# Patient Record
Sex: Female | Born: 1935 | Race: White | Hispanic: No | Smoking: Former smoker
Health system: Southern US, Community
[De-identification: ages and names within clinical notes are randomized; demographics above are authoritative.]

## PROBLEM LIST (undated history)

## (undated) DIAGNOSIS — I639 Cerebral infarction, unspecified: Secondary | ICD-10-CM

## (undated) DIAGNOSIS — I10 Essential (primary) hypertension: Secondary | ICD-10-CM

## (undated) DIAGNOSIS — N390 Urinary tract infection, site not specified: Secondary | ICD-10-CM

## (undated) DIAGNOSIS — F32A Depression, unspecified: Secondary | ICD-10-CM

## (undated) DIAGNOSIS — K219 Gastro-esophageal reflux disease without esophagitis: Secondary | ICD-10-CM

## (undated) DIAGNOSIS — F329 Major depressive disorder, single episode, unspecified: Secondary | ICD-10-CM

## (undated) DIAGNOSIS — E119 Type 2 diabetes mellitus without complications: Secondary | ICD-10-CM

## (undated) HISTORY — PX: TONSILLECTOMY: SUR1361

---

## 2014-04-04 ENCOUNTER — Emergency Department: Payer: Self-pay | Admitting: Emergency Medicine

## 2014-04-04 LAB — CBC WITH DIFFERENTIAL/PLATELET
BASOS ABS: 0 10*3/uL (ref 0.0–0.1)
Basophil %: 0.7 %
EOS PCT: 3.1 %
Eosinophil #: 0.1 10*3/uL (ref 0.0–0.7)
HCT: 32.5 % — ABNORMAL LOW (ref 35.0–47.0)
HGB: 10.8 g/dL — AB (ref 12.0–16.0)
LYMPHS ABS: 0.6 10*3/uL — AB (ref 1.0–3.6)
LYMPHS PCT: 13.7 %
MCH: 32 pg (ref 26.0–34.0)
MCHC: 33.1 g/dL (ref 32.0–36.0)
MCV: 96 fL (ref 80–100)
Monocyte #: 0.3 x10 3/mm (ref 0.2–0.9)
Monocyte %: 6.3 %
Neutrophil #: 3.3 10*3/uL (ref 1.4–6.5)
Neutrophil %: 76.2 %
PLATELETS: 146 10*3/uL — AB (ref 150–440)
RBC: 3.37 10*6/uL — AB (ref 3.80–5.20)
RDW: 14.7 % — ABNORMAL HIGH (ref 11.5–14.5)
WBC: 4.4 10*3/uL (ref 3.6–11.0)

## 2014-04-04 LAB — COMPREHENSIVE METABOLIC PANEL
ALT: 16 U/L
ANION GAP: 3 — AB (ref 7–16)
AST: 22 U/L (ref 15–37)
Albumin: 3 g/dL — ABNORMAL LOW (ref 3.4–5.0)
Alkaline Phosphatase: 56 U/L
BUN: 19 mg/dL — ABNORMAL HIGH (ref 7–18)
Bilirubin,Total: 0.3 mg/dL (ref 0.2–1.0)
CREATININE: 1.2 mg/dL (ref 0.60–1.30)
Calcium, Total: 8 mg/dL — ABNORMAL LOW (ref 8.5–10.1)
Chloride: 103 mmol/L (ref 98–107)
Co2: 30 mmol/L (ref 21–32)
EGFR (African American): 55 — ABNORMAL LOW
EGFR (Non-African Amer.): 45 — ABNORMAL LOW
GLUCOSE: 384 mg/dL — AB (ref 65–99)
Osmolality: 290 (ref 275–301)
POTASSIUM: 4.5 mmol/L (ref 3.5–5.1)
SODIUM: 136 mmol/L (ref 136–145)
Total Protein: 6 g/dL — ABNORMAL LOW (ref 6.4–8.2)

## 2014-04-04 LAB — TROPONIN I: Troponin-I: 0.04 ng/mL

## 2014-04-05 LAB — URINALYSIS, COMPLETE
Bilirubin,UR: NEGATIVE
Blood: NEGATIVE
Glucose,UR: >=500 mg/dL (ref 0–75)
KETONE: NEGATIVE
Leukocyte Esterase: NEGATIVE
Nitrite: NEGATIVE
PH: 6 (ref 4.5–8.0)
PROTEIN: NEGATIVE
RBC,UR: 1 /HPF (ref 0–5)
SQUAMOUS EPITHELIAL: NONE SEEN
Specific Gravity: 1.015 (ref 1.003–1.030)
WBC UR: 3 /HPF (ref 0–5)

## 2014-04-05 LAB — TROPONIN I: TROPONIN-I: 0.03 ng/mL

## 2014-04-05 LAB — GLUCOSE, RANDOM: GLUCOSE: 350 mg/dL — AB (ref 65–99)

## 2014-07-15 ENCOUNTER — Emergency Department: Payer: Self-pay | Admitting: Emergency Medicine

## 2014-07-15 LAB — CBC
HCT: 32.6 % — ABNORMAL LOW (ref 35.0–47.0)
HGB: 10.5 g/dL — AB (ref 12.0–16.0)
MCH: 30 pg (ref 26.0–34.0)
MCHC: 32.2 g/dL (ref 32.0–36.0)
MCV: 93 fL (ref 80–100)
PLATELETS: 165 10*3/uL (ref 150–440)
RBC: 3.51 10*6/uL — AB (ref 3.80–5.20)
RDW: 15.5 % — ABNORMAL HIGH (ref 11.5–14.5)
WBC: 4.9 10*3/uL (ref 3.6–11.0)

## 2014-07-15 LAB — URINALYSIS, COMPLETE
BILIRUBIN, UR: NEGATIVE
Bacteria: NONE SEEN
Blood: NEGATIVE
Glucose,UR: >=500 mg/dL (ref 0–75)
Ketone: NEGATIVE
LEUKOCYTE ESTERASE: NEGATIVE
Nitrite: NEGATIVE
PH: 6 (ref 4.5–8.0)
PROTEIN: NEGATIVE
RBC,UR: 1 /HPF (ref 0–5)
Specific Gravity: 1.011 (ref 1.003–1.030)
Squamous Epithelial: <1
WBC UR: NONE SEEN /HPF (ref 0–5)

## 2014-07-15 LAB — COMPREHENSIVE METABOLIC PANEL
ANION GAP: 7 (ref 7–16)
Albumin: 3.3 g/dL — ABNORMAL LOW (ref 3.4–5.0)
Alkaline Phosphatase: 57 U/L
BILIRUBIN TOTAL: 0.2 mg/dL (ref 0.2–1.0)
BUN: 31 mg/dL — AB (ref 7–18)
CREATININE: 1.36 mg/dL — AB (ref 0.60–1.30)
Calcium, Total: 7.9 mg/dL — ABNORMAL LOW (ref 8.5–10.1)
Chloride: 104 mmol/L (ref 98–107)
Co2: 26 mmol/L (ref 21–32)
EGFR (African American): 48 — ABNORMAL LOW
EGFR (Non-African Amer.): 39 — ABNORMAL LOW
GLUCOSE: 279 mg/dL — AB (ref 65–99)
Osmolality: 290 (ref 275–301)
POTASSIUM: 4.9 mmol/L (ref 3.5–5.1)
SGOT(AST): 19 U/L (ref 15–37)
SGPT (ALT): 18 U/L
Sodium: 137 mmol/L (ref 136–145)
TOTAL PROTEIN: 6.6 g/dL (ref 6.4–8.2)

## 2014-07-15 LAB — TROPONIN I: Troponin-I: 0.02 ng/mL

## 2014-08-16 ENCOUNTER — Emergency Department: Payer: Self-pay | Admitting: Emergency Medicine

## 2014-08-16 LAB — URINALYSIS, COMPLETE
BLOOD: NEGATIVE
Bilirubin,UR: NEGATIVE
Glucose,UR: NEGATIVE mg/dL (ref 0–75)
KETONE: NEGATIVE
Leukocyte Esterase: NEGATIVE
Nitrite: POSITIVE
Ph: 5 (ref 4.5–8.0)
RBC,UR: <1 /HPF (ref 0–5)
SPECIFIC GRAVITY: 1.018 (ref 1.003–1.030)
Squamous Epithelial: <1
WBC UR: 3 /HPF (ref 0–5)

## 2014-08-16 LAB — COMPREHENSIVE METABOLIC PANEL
Albumin: 3.6 g/dL (ref 3.4–5.0)
Alkaline Phosphatase: 60 U/L (ref 46–116)
Anion Gap: 7 (ref 7–16)
BILIRUBIN TOTAL: 0.4 mg/dL (ref 0.2–1.0)
BUN: 28 mg/dL — ABNORMAL HIGH (ref 7–18)
CHLORIDE: 104 mmol/L (ref 98–107)
CO2: 27 mmol/L (ref 21–32)
Calcium, Total: 9 mg/dL (ref 8.5–10.1)
Creatinine: 1.24 mg/dL (ref 0.60–1.30)
GFR CALC AF AMER: 53 — AB
GFR CALC NON AF AMER: 44 — AB
Glucose: 164 mg/dL — ABNORMAL HIGH (ref 65–99)
Osmolality: 285 (ref 275–301)
Potassium: 4.9 mmol/L (ref 3.5–5.1)
SGOT(AST): 17 U/L (ref 15–37)
SGPT (ALT): 17 U/L (ref 14–63)
SODIUM: 138 mmol/L (ref 136–145)
Total Protein: 6.8 g/dL (ref 6.4–8.2)

## 2014-08-16 LAB — CBC
HCT: 33.5 % — ABNORMAL LOW (ref 35.0–47.0)
HGB: 10.7 g/dL — AB (ref 12.0–16.0)
MCH: 29.2 pg (ref 26.0–34.0)
MCHC: 32 g/dL (ref 32.0–36.0)
MCV: 91 fL (ref 80–100)
PLATELETS: 165 10*3/uL (ref 150–440)
RBC: 3.67 10*6/uL — AB (ref 3.80–5.20)
RDW: 15.5 % — ABNORMAL HIGH (ref 11.5–14.5)
WBC: 4.9 10*3/uL (ref 3.6–11.0)

## 2014-08-16 LAB — LIPASE, BLOOD: Lipase: 97 U/L (ref 73–393)

## 2014-09-14 ENCOUNTER — Emergency Department: Payer: Self-pay | Admitting: Emergency Medicine

## 2014-11-08 NOTE — Op Note (Signed)
PATIENT NAME:  [name], Joci MR#:  [id number] DATE OF BIRTH:  1926/10/03  DATE OF PROCEDURE:  09/14/2014  PREOPERATIVE DIAGNOSES: Complex laceration of through and through left auricle.   POSTOPERATIVE DIAGNOSIS:  Complex laceration of through and through left auricle.  OPERATIVE PROCEDURE:  Complex repair of left auricle through the entire oral of cartilage and skin on both sides.   COMPLICATIONS: None.   PROCEDURE: The patient was seen in the Emergency Room and was first cleaned some. She had a through and through laceration that went from the conchal bowl all the way through the outer portion of the helix in a horizontal fashion. This is from blunt trauma. She had a second laceration above it that was on the surface of the upper helix and just through the skin in the front of the cartilage. This was a blunt tear so it was quite ragged edges and ragged cartilage as well that had to be trimmed and straightened out a little bit before it could be put together. Local anesthesia was done first with a block in the postauricular crease and then local was placed directly into the wound edges as well. The wound was copiously cleaned with Betadine and saline.   Once everything was cleaned up and was prepped and draped in a sterile fashion pieces were tried to piece back together to make sure it matched up. A 5-0 Rapide Vicryl was used for suturing the cartilage together in figure of eight stitches to hold it together at its edges. The outer border of the helix was then sutured with 6-0 nylon to make sure the edges came together at the border and then this was carried more anteriorly all the way into the conchal bowl.  The superior laceration that was above it was horizontal and parallel to it, but just through the skin was then repaired. The superior laceration was 2 cm where the through and through laceration was 4.5 cm. Once the anterior incision was done, with 6-0 Prolene the posterior laceration was then  sutured with 6-0 Prolene as well. This went from the postauricular crease all the way to the outside of the ear and involved, again, about 4-1/2 cm of length. Once this was all completely sutured, the wound was covered with some Neosporin ointment followed by a bandage. The patient tolerated the procedure well. This was done at the bedside in the Emergency Room. There were no operative complications.     ____________________________ Cammy CopaPaul H. Caryn Gienger, MD phj:at D: 09/14/2014 21:07:11 ET T: 09/14/2014 21:36:00 ET JOB#: [id number]  cc: Cammy CopaPaul H. Shiara Mcgough, MD, <Dictator> Cammy CopaPAUL H Eudell Mcphee MD ELECTRONICALLY SIGNED 09/20/2014 11:25

## 2014-11-08 NOTE — Consult Note (Signed)
PATIENT NAME:  Julie Burch, Julie Burch MR#:  [id number] DATE OF BIRTH:  12-30-1926  DATE OF CONSULTATION:  09/14/2014  CONSULTING PHYSICIAN:  Cammy CopaPaul H. Orien Mayhall, MD  CONSULTING PHYSICIAN: Cammy CopaPaul H. Wister Hoefle, M.D.   REASON FOR CONSULTATION: Evaluate left ear laceration.   HISTORY OF PRESENT ILLNESS: The patient is an 81 year old white female who tripped on her way to the bathroom and hit her left ear, she fell. She is complaining of pain in her ear. She did not lose consciousness. She has fallen several times but this time caused a significant laceration to her left auricle. She complained of some pain in her neck but no other neurologic symptoms.   REVIEW OF SYSTEMS: Significant for frequent falls. No chest pain. No shortness of breath.   PAST MEDICAL HISTORY:  She has had underlying heart problems.   ALLERGIES:  ARE AS NOTED IN THE CHART.  SHE IS ALLERGIC TO SULFA AND CIPROFLOXIN ANTIBIOTICS.     SOCIAL HISTORY: She denies smoking or alcohol use.   PHYSICAL EXAMINATION:  VITAL SIGNS: She has a laceration that is through and through the left mid portion of the ears, it goes from the conchal bowl all the way through and through to the postauricular crease. Cartilage is bluntly torn and fractured and the skin is bluntly torn as well.  The second simple laceration above it that it is about 2 cm where the laceration is about 4.5 cm in the front and 4.5 cm on the back of the auricle. The rest of her exam does not show any significant injuries.   The wound was cleaned up, anesthetized and sutured in the Emergency Room. This was dictated in detail elsewhere.    IMPRESSION: The patient has had a fall with blunt tear of the auricle through and through.  This was repaired in the Emergency Room. I have given her Keflex 500 mg b.i.d. for one week. They will use ointment and a Band-Aid over it if they can and follow up in one week for suture removal. We are giving her tramadol to use starting at 50 mg for severe pain, but  she will use just plain Tylenol if she does not have a whole lot of pain. Will see her in one week, but they know they can call sooner if there is any signs of challenges or problems with healing.    ____________________________ Cammy CopaPaul H. Navraj Dreibelbis, MD phj:at D: 09/14/2014 21:10:59 ET T: 09/14/2014 21:26:14 ET JOB#: [id number]  cc: Cammy CopaPaul H. Camdyn Laden, MD, <Dictator> Cammy CopaPAUL H Abner Ardis MD ELECTRONICALLY SIGNED 09/20/2014 11:25

## 2014-12-30 ENCOUNTER — Other Ambulatory Visit
Admission: RE | Admit: 2014-12-30 | Discharge: 2014-12-30 | Disposition: A | Discharge status: Home / Self Care | Payer: Medicare Other | Source: Ambulatory Visit | Attending: Internal Medicine | Admitting: Internal Medicine

## 2014-12-30 DIAGNOSIS — E119 Type 2 diabetes mellitus without complications: Secondary | ICD-10-CM | POA: Diagnosis present

## 2014-12-30 DIAGNOSIS — R8290 Unspecified abnormal findings in urine: Secondary | ICD-10-CM | POA: Diagnosis not present

## 2014-12-30 LAB — URINALYSIS COMPLETE WITH MICROSCOPIC (ARMC ONLY)
Bilirubin Urine: NEGATIVE
HGB URINE DIPSTICK: NEGATIVE
Ketones, ur: NEGATIVE mg/dL
Nitrite: NEGATIVE
Protein, ur: 30 mg/dL — AB
SPECIFIC GRAVITY, URINE: 1.015 (ref 1.005–1.030)

## 2015-01-01 LAB — URINE CULTURE: Culture: >=100000

## 2015-04-30 ENCOUNTER — Emergency Department
Admission: EM | Admit: 2015-04-30 | Discharge: 2015-05-01 | Disposition: A | Discharge status: Home / Self Care | Payer: Medicare Other | Attending: Emergency Medicine | Admitting: Emergency Medicine

## 2015-04-30 ENCOUNTER — Emergency Department: Payer: Medicare Other

## 2015-04-30 ENCOUNTER — Encounter: Payer: Self-pay | Admitting: Emergency Medicine

## 2015-04-30 DIAGNOSIS — N12 Tubulo-interstitial nephritis, not specified as acute or chronic: Secondary | ICD-10-CM | POA: Insufficient documentation

## 2015-04-30 DIAGNOSIS — E119 Type 2 diabetes mellitus without complications: Secondary | ICD-10-CM | POA: Diagnosis not present

## 2015-04-30 DIAGNOSIS — I10 Essential (primary) hypertension: Secondary | ICD-10-CM | POA: Insufficient documentation

## 2015-04-30 DIAGNOSIS — R1032 Left lower quadrant pain: Secondary | ICD-10-CM | POA: Diagnosis present

## 2015-04-30 DIAGNOSIS — R109 Unspecified abdominal pain: Secondary | ICD-10-CM

## 2015-04-30 HISTORY — DX: Major depressive disorder, single episode, unspecified: F32.9

## 2015-04-30 HISTORY — DX: Essential (primary) hypertension: I10

## 2015-04-30 HISTORY — DX: Cerebral infarction, unspecified: I63.9

## 2015-04-30 HISTORY — DX: Type 2 diabetes mellitus without complications: E11.9

## 2015-04-30 HISTORY — DX: Depression, unspecified: F32.A

## 2015-04-30 LAB — COMPREHENSIVE METABOLIC PANEL
ALK PHOS: 63 U/L (ref 38–126)
ALT: 18 U/L (ref 14–54)
AST: 32 U/L (ref 15–41)
Albumin: 3.8 g/dL (ref 3.5–5.0)
Anion gap: 8 (ref 5–15)
BILIRUBIN TOTAL: 0.6 mg/dL (ref 0.3–1.2)
BUN: 25 mg/dL — ABNORMAL HIGH (ref 6–20)
CALCIUM: 8.9 mg/dL (ref 8.9–10.3)
CHLORIDE: 102 mmol/L (ref 101–111)
CO2: 25 mmol/L (ref 22–32)
CREATININE: 1.27 mg/dL — AB (ref 0.44–1.00)
GFR, EST AFRICAN AMERICAN: 43 mL/min — AB (ref >60)
GFR, EST NON AFRICAN AMERICAN: 37 mL/min — AB (ref >60)
Glucose, Bld: 359 mg/dL — ABNORMAL HIGH (ref 65–99)
Potassium: 4.6 mmol/L (ref 3.5–5.1)
Sodium: 135 mmol/L (ref 135–145)
TOTAL PROTEIN: 7.3 g/dL (ref 6.5–8.1)

## 2015-04-30 LAB — URINALYSIS COMPLETE WITH MICROSCOPIC (ARMC ONLY)
BILIRUBIN URINE: NEGATIVE
Bacteria, UA: NONE SEEN
KETONES UR: NEGATIVE mg/dL
NITRITE: NEGATIVE
PH: 5 (ref 5.0–8.0)
Protein, ur: 30 mg/dL — AB
Specific Gravity, Urine: 1.014 (ref 1.005–1.030)

## 2015-04-30 LAB — CBC
HCT: 34.7 % — ABNORMAL LOW (ref 35.0–47.0)
Hemoglobin: 11.6 g/dL — ABNORMAL LOW (ref 12.0–16.0)
MCH: 30.9 pg (ref 26.0–34.0)
MCHC: 33.4 g/dL (ref 32.0–36.0)
MCV: 92.6 fL (ref 80.0–100.0)
PLATELETS: 144 10*3/uL — AB (ref 150–440)
RBC: 3.74 MIL/uL — AB (ref 3.80–5.20)
RDW: 16.7 % — ABNORMAL HIGH (ref 11.5–14.5)
WBC: 6.3 10*3/uL (ref 3.6–11.0)

## 2015-04-30 LAB — LIPASE, BLOOD: Lipase: 23 U/L (ref 11–51)

## 2015-04-30 MED ORDER — CEFDINIR 300 MG PO CAPS: 300.0000 mg | ORAL_CAPSULE | Freq: Two times a day (BID) | ORAL | Status: DC

## 2015-04-30 MED ORDER — IOHEXOL 240 MG/ML SOLN
25.0000 mL | Freq: Once | INTRAMUSCULAR | Status: AC | PRN
  Administered 2015-04-30: 25 mL via ORAL

## 2015-04-30 MED ORDER — DEXTROSE 5 % IV SOLN
1.0000 g | Freq: Once | INTRAVENOUS | Status: AC
  Administered 2015-04-30: 1 g via INTRAVENOUS
  Filled 2015-04-30: qty 10

## 2015-04-30 MED ORDER — IOHEXOL 300 MG/ML  SOLN
75.0000 mL | Freq: Once | INTRAMUSCULAR | Status: AC | PRN
  Administered 2015-04-30: 75 mL via INTRAVENOUS

## 2015-04-30 MED ORDER — CEPHALEXIN 500 MG PO CAPS: 500.0000 mg | ORAL_CAPSULE | Freq: Three times a day (TID) | ORAL | Status: DC

## 2015-04-30 NOTE — ED Notes (Signed)
[facility] Pt started not feeling good around 4pm. She states after dinner she started having abdominal pain on the left side and unable to describe. She was nauseated and did vomit.  Pain radiates to her back. Was given 7 Units of Humalog and Tr[facility]. Pain has decreased since initial instance of pain.

## 2015-04-30 NOTE — ED Notes (Signed)
Pt back form CT

## 2015-04-30 NOTE — ED Notes (Signed)
CT tech notified pt finished drinking contrast.  

## 2015-04-30 NOTE — ED Notes (Signed)
Daughter at bedside, states [facility], was diagnosed with UTI and placed on Cefuroxime 250 mg.

## 2015-04-30 NOTE — Discharge Instructions (Signed)
Please seek medical attention for any high fevers, chest pain, shortness of breath, change in behavior, persistent vomiting, bloody stool or any other new or concerning symptoms. ° ° °Abdominal Pain, Adult °Many things can cause abdominal pain. Usually, abdominal pain is not caused by a disease and will improve without treatment. It can often be observed and treated at home. Your health care provider will do a physical exam and possibly order blood tests and X-rays to help determine the seriousness of your pain. However, in many cases, more time must pass before a clear cause of the pain can be found. Before that point, your health care provider may not know if you need more testing or further treatment. °HOME CARE INSTRUCTIONS °Monitor your abdominal pain for any changes. The following actions may help to alleviate any discomfort you are experiencing: °· Only take over-the-counter or prescription medicines as directed by your health care provider. °· Do not take laxatives unless directed to do so by your health care provider. °· Try a clear liquid diet (broth, tea, or water) as directed by your health care provider. Slowly move to a bland diet as tolerated. °SEEK MEDICAL CARE IF: °· You have unexplained abdominal pain. °· You have abdominal pain associated with nausea or diarrhea. °· You have pain when you urinate or have a bowel movement. °· You experience abdominal pain that wakes you in the night. °· You have abdominal pain that is worsened or improved by eating food. °· You have abdominal pain that is worsened with eating fatty foods. °· You have a fever. °SEEK IMMEDIATE MEDICAL CARE IF: °· Your pain does not go away within 2 hours. °· You keep throwing up (vomiting). °· Your pain is felt only in portions of the abdomen, such as the right side or the left lower portion of the abdomen. °· You pass bloody or black tarry stools. °MAKE SURE YOU: °· Understand these instructions. °· Will watch your  condition. °· Will get help right away if you are not doing well or get worse. °  °This information is not intended to replace advice given to you by your health care provider. Make sure you discuss any questions you have with your health care provider. °  °Document Released: 04/05/2005 Document Revised: 03/17/2015 Document Reviewed: 03/05/2013 °Elsevier Interactive Patient Education ©2016 [employer] ° °

## 2015-04-30 NOTE — ED Notes (Signed)
Bruising to left forehead, patient states she fell 3-4 days ago.

## 2015-04-30 NOTE — ED Provider Notes (Addendum)
[facility] Emergency Department Provider Note   ____________________________________________  Time seen: On EMS arrival  I have reviewed the triage vital signs and the nursing notes.   HISTORY  Chief Complaint Abdominal Pain   History limited by: Not Limited   HPI Chianne Byrns is a 81 y.o. female who presents to the emergency department today because of concerns for abdominal pain. She states that the pain is located in her left lower quadrant. She states that the pain started roughly 2-1/2 hours ago. It started gradually. The patient was given pain medication at [facility] and she states that the pain has now gotten better. She stated she had some nausea. She denied any vomiting. Denied any recent changes in her bowel movements. Denies any bloody stool. Denied any dysuria. Denied any fevers. Denies having similar symptoms in the past.   Past Medical History  Diagnosis Date  . Diabetes mellitus without complication (HCC)   . Depression   . Hypertension   . Stroke [facility])     There are no active problems to display for this patient.   No past surgical history on file.  No current outpatient prescriptions on file.  Allergies Review of patient's allergies indicates not on file.  No family history on file.  Social History Social History  Substance Use Topics  . Smoking status: Never Smoker   . Smokeless tobacco: None  . Alcohol Use: No    Review of Systems  Constitutional: Negative for fever. Cardiovascular: Negative for chest pain. Respiratory: Negative for shortness of breath. Gastrointestinal: Positive for abdominal pain Genitourinary: Negative for dysuria. Musculoskeletal: Negative for back pain. Skin: Negative for rash. Neurological: Negative for headaches, focal weakness or numbness.  10-point ROS otherwise negative.  ____________________________________________   PHYSICAL EXAM:  VITAL SIGNS:   97.4 F (36.3 C)  77   21    197/63 mmHg  97 %     Constitutional: Alert and oriented. Well appearing and in no distress. Eyes: Conjunctivae are normal. PERRL. Normal extraocular movements. ENT   Head: Normocephalic and atraumatic.   Nose: No congestion/rhinnorhea.   Mouth/Throat: Mucous membranes are moist.   Neck: No stridor. Hematological/Lymphatic/Immunilogical: No cervical lymphadenopathy. Cardiovascular: Normal rate, regular rhythm.  No murmurs, rubs, or gallops. Respiratory: Normal respiratory effort without tachypnea nor retractions. Breath sounds are clear and equal bilaterally. No wheezes/rales/rhonchi. Gastrointestinal: Soft and nontender. No distention.  Genitourinary: Deferred Musculoskeletal: Normal range of motion in all extremities. No joint effusions.  No lower extremity tenderness nor edema. Neurologic:  Normal speech and language. No gross focal neurologic deficits are appreciated. Speech is normal.  Skin:  Skin is warm, dry and intact. No rash noted. Psychiatric: Mood and affect are normal. Speech and behavior are normal. Patient exhibits appropriate insight and judgment.  ____________________________________________    LABS (pertinent positives/negatives)  Labs Reviewed  COMPREHENSIVE METABOLIC PANEL - Abnormal; Notable for the following:    Glucose, Bld 359 (*)    BUN 25 (*)    Creatinine, Ser 1.27 (*)    GFR calc non Af Amer 37 (*)    GFR calc Af Amer 43 (*)    All other components within normal limits  CBC - Abnormal; Notable for the following:    RBC 3.74 (*)    Hemoglobin 11.6 (*)    HCT 34.7 (*)    RDW 16.7 (*)    Platelets 144 (*)    All other components within normal limits  LIPASE, BLOOD  URINALYSIS COMPLETEWITH MICROSCOPIC [facility]  ONLY)     ____________________________________________   EKG  None  ____________________________________________    RADIOLOGY  CT abdomen and pelvis   IMPRESSION: 1. Left pyelonephritis. No abscess. 2. Dense  atherosclerosis. Unchanged tiny hypodensity in the liver, likely small cyst or hemangioma.  ____________________________________________   PROCEDURES  Procedure(s) performed: None  Critical Care performed: No  ____________________________________________   INITIAL IMPRESSION / ASSESSMENT AND PLAN / ED COURSE  Pertinent labs & imaging results that were available during my care of the patient were reviewed by me and considered in my medical decision making (see chart for details).  Patient presents to the emergency department today with left sided abdominal pain. This started slightly after eating today. She does state that she feels much better after tramadol given at nursing home facility. On exam abdomen is benign. No tenderness, no rebound or guarding. Blood work without any concerning findings. Patient does have a history of kidney stones. I discussed with the daughter who has power of attorney. Will proceed with CT scan to evaluate for kidney stone vs other pathology such as diverticulitis.   ----------------------------------------- 10:38 PM on 04/30/2015 -----------------------------------------  CT abdomen and pelvis shows possible pyelonephritis. Patient has been treated since Wednesday for urinary tract infection with Ceftin here. I do not see that a urine culture was sent by the outside ED. Will send one here. Will give dose of IV rocephin. Feel patient is stable for discharge.  ____________________________________________   FINAL CLINICAL IMPRESSION(S) / ED DIAGNOSES  Abdominal pain  Phineas SemenGraydon Argusta Mcgann, MD 04/30/15 [id number]  Phineas SemenGraydon Marirose Deveney, MD 04/30/15 2238

## 2015-04-30 NOTE — ED Notes (Signed)
Patient transported to CT 

## 2015-05-01 NOTE — ED Notes (Signed)

## 2015-05-01 NOTE — ED Notes (Signed)
Pharm advised no action and non-desicating meds, family and pt adivised, IV dc'd and return of . fluid noted

## 2015-05-01 NOTE — ED Notes (Signed)
IV found to be infiltrated, attempted to withdraw meds, estimate 5-10 mls infiltrated, RN notified and pharm called for advice

## 2015-05-02 LAB — URINE CULTURE

## 2015-07-22 ENCOUNTER — Emergency Department
Admission: EM | Admit: 2015-07-22 | Discharge: 2015-07-23 | Disposition: A | Discharge status: Home / Self Care | Payer: Medicare Other | Attending: Emergency Medicine | Admitting: Emergency Medicine

## 2015-07-22 ENCOUNTER — Emergency Department: Payer: Medicare Other

## 2015-07-22 DIAGNOSIS — I10 Essential (primary) hypertension: Secondary | ICD-10-CM | POA: Insufficient documentation

## 2015-07-22 DIAGNOSIS — S0083XA Contusion of other part of head, initial encounter: Secondary | ICD-10-CM

## 2015-07-22 DIAGNOSIS — Y9389 Activity, other specified: Secondary | ICD-10-CM | POA: Diagnosis not present

## 2015-07-22 DIAGNOSIS — S199XXA Unspecified injury of neck, initial encounter: Secondary | ICD-10-CM | POA: Insufficient documentation

## 2015-07-22 DIAGNOSIS — Z794 Long term (current) use of insulin: Secondary | ICD-10-CM | POA: Insufficient documentation

## 2015-07-22 DIAGNOSIS — S0181XA Laceration without foreign body of other part of head, initial encounter: Secondary | ICD-10-CM | POA: Diagnosis not present

## 2015-07-22 DIAGNOSIS — W1839XA Other fall on same level, initial encounter: Secondary | ICD-10-CM | POA: Diagnosis not present

## 2015-07-22 DIAGNOSIS — Y998 Other external cause status: Secondary | ICD-10-CM | POA: Diagnosis not present

## 2015-07-22 DIAGNOSIS — S51811A Laceration without foreign body of right forearm, initial encounter: Secondary | ICD-10-CM | POA: Insufficient documentation

## 2015-07-22 DIAGNOSIS — E109 Type 1 diabetes mellitus without complications: Secondary | ICD-10-CM | POA: Diagnosis not present

## 2015-07-22 DIAGNOSIS — S0990XA Unspecified injury of head, initial encounter: Secondary | ICD-10-CM | POA: Diagnosis present

## 2015-07-22 DIAGNOSIS — Z79899 Other long term (current) drug therapy: Secondary | ICD-10-CM | POA: Insufficient documentation

## 2015-07-22 DIAGNOSIS — N390 Urinary tract infection, site not specified: Secondary | ICD-10-CM | POA: Diagnosis not present

## 2015-07-22 DIAGNOSIS — Y92121 Bathroom in nursing home as the place of occurrence of the external cause: Secondary | ICD-10-CM | POA: Insufficient documentation

## 2015-07-22 LAB — URINALYSIS COMPLETE WITH MICROSCOPIC (ARMC ONLY)
BILIRUBIN URINE: NEGATIVE
HGB URINE DIPSTICK: NEGATIVE
KETONES UR: NEGATIVE mg/dL
LEUKOCYTES UA: NEGATIVE
NITRITE: POSITIVE — AB
PH: 6 (ref 5.0–8.0)
Protein, ur: NEGATIVE mg/dL
Specific Gravity, Urine: 1.011 (ref 1.005–1.030)
Squamous Epithelial / LPF: NONE SEEN

## 2015-07-22 LAB — COMPREHENSIVE METABOLIC PANEL
ALT: 15 U/L (ref 14–54)
ANION GAP: 6 (ref 5–15)
AST: 22 U/L (ref 15–41)
Albumin: 3.7 g/dL (ref 3.5–5.0)
Alkaline Phosphatase: 54 U/L (ref 38–126)
BUN: 30 mg/dL — ABNORMAL HIGH (ref 6–20)
CALCIUM: 8.9 mg/dL (ref 8.9–10.3)
CHLORIDE: 101 mmol/L (ref 101–111)
CO2: 28 mmol/L (ref 22–32)
Creatinine, Ser: 1.29 mg/dL — ABNORMAL HIGH (ref 0.44–1.00)
GFR, EST AFRICAN AMERICAN: 42 mL/min — AB (ref >60)
GFR, EST NON AFRICAN AMERICAN: 36 mL/min — AB (ref >60)
Glucose, Bld: 396 mg/dL — ABNORMAL HIGH (ref 65–99)
Potassium: 5 mmol/L (ref 3.5–5.1)
SODIUM: 135 mmol/L (ref 135–145)
Total Bilirubin: 0.8 mg/dL (ref 0.3–1.2)
Total Protein: 6.9 g/dL (ref 6.5–8.1)

## 2015-07-22 LAB — CBC WITH DIFFERENTIAL/PLATELET
Basophils Absolute: 0 10*3/uL (ref 0–0.1)
Basophils Relative: 1 %
Eosinophils Absolute: 0.2 10*3/uL (ref 0–0.7)
Eosinophils Relative: 4 %
HEMATOCRIT: 34.2 % — AB (ref 35.0–47.0)
HEMOGLOBIN: 11.1 g/dL — AB (ref 12.0–16.0)
LYMPHS ABS: 0.8 10*3/uL — AB (ref 1.0–3.6)
Lymphocytes Relative: 17 %
MCH: 29.4 pg (ref 26.0–34.0)
MCHC: 32.6 g/dL (ref 32.0–36.0)
MCV: 90.2 fL (ref 80.0–100.0)
MONOS PCT: 8 %
Monocytes Absolute: 0.4 10*3/uL (ref 0.2–0.9)
NEUTROS ABS: 3.2 10*3/uL (ref 1.4–6.5)
NEUTROS PCT: 70 %
PLATELETS: 154 10*3/uL (ref 150–440)
RBC: 3.79 MIL/uL — ABNORMAL LOW (ref 3.80–5.20)
RDW: 16.1 % — AB (ref 11.5–14.5)
WBC: 4.6 10*3/uL (ref 3.6–11.0)

## 2015-07-22 LAB — TROPONIN I: TROPONIN I: 0.03 ng/mL (ref <0.031)

## 2015-07-22 LAB — GLUCOSE, CAPILLARY: GLUCOSE-CAPILLARY: 326 mg/dL — AB (ref 65–99)

## 2015-07-22 LAB — MAGNESIUM: MAGNESIUM: 2 mg/dL (ref 1.7–2.4)

## 2015-07-22 LAB — CK: Total CK: 75 U/L (ref 38–234)

## 2015-07-22 MED ORDER — MORPHINE SULFATE (PF) 4 MG/ML IV SOLN
4.0000 mg | Freq: Once | INTRAVENOUS | Status: AC
  Administered 2015-07-22: 4 mg via INTRAVENOUS
  Filled 2015-07-22: qty 1

## 2015-07-22 MED ORDER — CEPHALEXIN 500 MG PO CAPS: 500.0000 mg | ORAL_CAPSULE | Freq: Two times a day (BID) | ORAL | Status: DC

## 2015-07-22 MED ORDER — ONDANSETRON HCL 4 MG/2ML IJ SOLN
4.0000 mg | INTRAMUSCULAR | Status: AC
  Administered 2015-07-22: 4 mg via INTRAVENOUS
  Filled 2015-07-22: qty 2

## 2015-07-22 MED ORDER — TRAMADOL HCL 50 MG PO TABS: 50.0000 mg | ORAL_TABLET | Freq: Four times a day (QID) | ORAL | Status: DC | PRN

## 2015-07-22 NOTE — ED Notes (Signed)
Patient transported to CT 

## 2015-07-22 NOTE — ED Notes (Addendum)
Pt bib EMS w/ c/o fall. [facility], pt down for indeterminate amount of time.  Pt has lac to head, bandage by EMS and skin tear to R arm.  Pt A/O.  Pt c/o pain to head and neck, spinal precautions taken.   No obvious deformities to limbs.  Pt able to move/feel all extremities.  Pt sts she does not know how she fell.

## 2015-07-22 NOTE — Discharge Instructions (Signed)
You have been seen in the Emergency Department (ED) today for a fall.  Fortunately your work up does not show any concerning injuries.  Please take over-the-counter ibuprofen and/or Tylenol as needed for your pain (unless you have an allergy or your doctor as told you not to take them), or take any prescribed medication as instructed (Tramadol)  Your urinalysis, while generally reassuring, was positive for nitrites, which is consistent with infection.  We sent a urine specimen for culture but have started you on Keflex to treat the probable urinary tract infection.  Your wounds (forehead and right forearm) were irrigated and closed with Steri-Strips (and skin adhesive on your forehead).  Please keep both wounds as dry as possible and the Steri-Strips (and skin glue) will eventually fall off on its own.  If it falls off early, do not try to re-close the wound(s) and just follow up with your regular doctor.  Please follow up with your doctor regarding today's Emergency Department (ED) visit and your recent fall.    Return to the ED if you have any headache, confusion, slurred speech, weakness/numbness of any arm or leg, or any increased pain.   Facial or Scalp Contusion A facial or scalp contusion is a deep bruise on the face or head. Injuries to the face and head generally cause a lot of swelling, especially around the eyes. Contusions are the result of an injury that caused bleeding under the skin. The contusion may turn blue, purple, or yellow. Minor injuries will give you a painless contusion, but more severe contusions may stay painful and swollen for a few weeks.  CAUSES  A facial or scalp contusion is caused by a blunt injury or trauma to the face or head area.  SIGNS AND SYMPTOMS   Swelling of the injured area.   Discoloration of the injured area.   Tenderness, soreness, or pain in the injured area.  DIAGNOSIS  The diagnosis can be made by taking a medical history and doing a physical  exam. An X-ray exam, CT scan, or MRI may be needed to determine if there are any associated injuries, such as broken bones (fractures). TREATMENT  Often, the best treatment for a facial or scalp contusion is applying cold compresses to the injured area. Over-the-counter medicines may also be recommended for pain control.  HOME CARE INSTRUCTIONS   Only take over-the-counter or prescription medicines as directed by your health care provider.   Apply ice to the injured area.   Put ice in a plastic bag.   Place a towel between your skin and the bag.   Leave the ice on for 20 minutes, 2-3 times a day.  SEEK MEDICAL CARE IF:  You have bite problems.   You have pain with chewing.   You are concerned about facial defects. SEEK IMMEDIATE MEDICAL CARE IF:  You have severe pain or a headache that is not relieved by medicine.   You have unusual sleepiness, confusion, or personality changes.   You throw up (vomit).   You have a persistent nosebleed.   You have double vision or blurred vision.   You have fluid drainage from your nose or ear.   You have difficulty walking or using your arms or legs.  MAKE SURE YOU:   Understand these instructions.  Will watch your condition.  Will get help right away if you are not doing well or get worse.   This information is not intended to replace advice given to you by your  health care provider. Make sure you discuss any questions you have with your health care provider.   Document Released: 08/03/2004 Document Revised: 07/17/2014 Document Reviewed: 02/06/2013 Elsevier Interactive Patient Education 2016 [employer]  Facial Laceration  A facial laceration is a cut on the face. These injuries can be painful and cause bleeding. Lacerations usually heal quickly, but they need special care to reduce scarring. DIAGNOSIS  Your health care provider will take a medical history, ask for details about how the injury occurred, and  examine the wound to determine how deep the cut is. TREATMENT  Some facial lacerations may not require closure. Others may not be able to be closed because of an increased risk of infection. The risk of infection and the chance for successful closure will depend on various factors, including the amount of time since the injury occurred. The wound may be cleaned to help prevent infection. If closure is appropriate, pain medicines may be given if needed. Your health care provider will use stitches (sutures), wound glue (adhesive), or skin adhesive strips to repair the laceration. These tools bring the skin edges together to allow for faster healing and a better cosmetic outcome. If needed, you may also be given a tetanus shot. HOME CARE INSTRUCTIONS  Only take over-the-counter or prescription medicines as directed by your health care provider.  Follow your health care provider's instructions for wound care. These instructions will vary depending on the technique used for closing the wound. For Sutures:  Keep the wound clean and dry.   If you were given a bandage (dressing), you should change it at least once a day. Also change the dressing if it becomes wet or dirty, or as directed by your health care provider.   Wash the wound with soap and water 2 times a day. Rinse the wound off with water to remove all soap. Pat the wound dry with a clean towel.   After cleaning, apply a thin layer of the antibiotic ointment recommended by your health care provider. This will help prevent infection and keep the dressing from sticking.   You may shower as usual after the first 24 hours. Do not soak the wound in water until the sutures are removed.   Get your sutures removed as directed by your health care provider. With facial lacerations, sutures should usually be taken out after 4-5 days to avoid stitch marks.   Wait a few days after your sutures are removed before applying any makeup. For Skin  Adhesive Strips:  Keep the wound clean and dry.   Do not get the skin adhesive strips wet. You may bathe carefully, using caution to keep the wound dry.   If the wound gets wet, pat it dry with a clean towel.   Skin adhesive strips will fall off on their own. You may trim the strips as the wound heals. Do not remove skin adhesive strips that are still stuck to the wound. They will fall off in time.  For Wound Adhesive:  You may briefly wet your wound in the shower or bath. Do not soak or scrub the wound. Do not swim. Avoid periods of heavy sweating until the skin adhesive has fallen off on its own. After showering or bathing, gently pat the wound dry with a clean towel.   Do not apply liquid medicine, cream medicine, ointment medicine, or makeup to your wound while the skin adhesive is in place. This may loosen the film before your wound is healed.  If a dressing is placed over the wound, be careful not to apply tape directly over the skin adhesive. This may cause the adhesive to be pulled off before the wound is healed.   Avoid prolonged exposure to sunlight or tanning lamps while the skin adhesive is in place.  The skin adhesive will usually remain in place for 5-10 days, then naturally fall off the skin. Do not pick at the adhesive film.  After Healing: Once the wound has healed, cover the wound with sunscreen during the day for 1 full year. This can help minimize scarring. Exposure to ultraviolet light in the first year will darken the scar. It can take 1-2 years for the scar to lose its redness and to heal completely.  SEEK MEDICAL CARE IF:  You have a fever. SEEK IMMEDIATE MEDICAL CARE IF:  You have redness, pain, or swelling around the wound.   You see ayellowish-white fluid (pus) coming from the wound.    This information is not intended to replace advice given to you by your health care provider. Make sure you discuss any questions you have with your health care  provider.   Document Released: 08/03/2004 Document Revised: 07/17/2014 Document Reviewed: 02/06/2013 Elsevier Interactive Patient Education 2016 [employer].  Stitches, [place], or Adhesive Wound Closure Health care providers use stitches (sutures), staples, and certain glue (skin adhesives) to hold skin together while it heals (wound closure). You may need this treatment after you have surgery or if you cut your skin accidentally. These methods help your skin to heal more quickly and make it less likely that you will have a scar. A wound may take several months to heal completely. The type of wound you have determines when your wound gets closed. In most cases, the wound is closed as soon as possible (primary skin closure). Sometimes, closure is delayed so the wound can be cleaned and allowed to heal naturally. This reduces the chance of infection. Delayed closure may be needed if your wound:  Is caused by a bite.  Happened more than 6 hours ago.  Involves loss of skin or the tissues under the skin.  Has dirt or debris in it that cannot be removed.  Is infected. WHAT ARE THE DIFFERENT KINDS OF WOUND CLOSURES? There are many options for wound closure. The one that your health care provider uses depends on how deep and how large your wound is. Adhesive Glue To use this type of glue to close a wound, your health care provider holds the edges of the wound together and paints the glue on the surface of your skin. You may need more than one layer of glue. Then the wound may be covered with a light bandage (dressing). This type of skin closure may be used for small wounds that are not deep (superficial). Using glue for wound closure is less painful than other methods. It does not require a medicine that numbs the area (local anesthetic). This method also leaves nothing to be removed. Adhesive glue is often used for children and on facial wounds. Adhesive glue cannot be used for wounds that are  deep, uneven, or bleeding. It is not used inside of a wound.  Adhesive Strips These strips are made of sticky (adhesive), porous paper. They are applied across your skin edges like a regular adhesive bandage. You leave them on until they fall off. Adhesive strips may be used to close very superficial wounds. They may also be used along with sutures to improve the  closure of your skin edges.  Sutures Sutures are the oldest method of wound closure. Sutures can be made from natural substances, such as silk, or from synthetic materials, such as nylon and steel. They can be made from a material that your body can break down as your wound heals (absorbable), or they can be made from a material that needs to be removed from your skin (nonabsorbable). They come in many different strengths and sizes. Your health care provider attaches the sutures to a steel needle on one end. Sutures can be passed through your skin, or through the tissues beneath your skin. Then they are tied and cut. Your skin edges may be closed in one continuous stitch or in separate stitches. Sutures are strong and can be used for all kinds of wounds. Absorbable sutures may be used to close tissues under the skin. The disadvantage of sutures is that they may cause skin reactions that lead to infection. Nonabsorbable sutures need to be removed. Staples When surgical staples are used to close a wound, the edges of your skin on both sides of the wound are brought close together. A staple is placed across the wound, and an instrument secures the edges together. Staples are often used to close surgical cuts (incisions). Staples are faster to use than sutures, and they cause less skin reaction. Staples need to be removed using a tool that bends the staples away from your skin. HOW DO I CARE FOR MY WOUND CLOSURE?  Take medicines only as directed by your health care provider.  If you were prescribed an antibiotic medicine for your wound, finish it  all even if you start to feel better.  Use ointments or creams only as directed by your health care provider.  Wash your hands with soap and water before and after touching your wound.  Do not soak your wound in water. Do not take baths, swim, or use a hot tub until your health care provider approves.  Ask your health care provider when you can start showering. Cover your wound if directed by your health care provider.  Do not take out your own sutures or staples.  Do not pick at your wound. Picking can cause an infection.  Keep all follow-up visits as directed by your health care provider. This is important. HOW LONG WILL I HAVE MY WOUND CLOSURE?  Leave adhesive glue on your skin until the glue peels away.  Leave adhesive strips on your skin until the strips fall off.  Absorbable sutures will dissolve within several days.  Nonabsorbable sutures and staples must be removed. The location of the wound will determine how long they stay in. This can range from several days to a couple of weeks. WHEN SHOULD I SEEK HELP FOR MY WOUND CLOSURE? Contact your health care provider if:  You have a fever.  You have chills.  You have drainage, redness, swelling, or pain at your wound.  There is a bad smell coming from your wound.  The skin edges of your wound start to separate after your sutures have been removed.  Your wound becomes thick, raised, and darker in color after your sutures come out (scarring).   This information is not intended to replace advice given to you by your health care provider. Make sure you discuss any questions you have with your health care provider.   Document Released: 03/21/2001 Document Revised: 07/17/2014 Document Reviewed: 12/03/2013 Elsevier Interactive Patient Education 2016 Elsevier Inc.   Urinary Tract Infection Urinary tract  infections (UTIs) can develop anywhere along your urinary tract. Your urinary tract is your body's drainage system for  removing wastes and extra water. Your urinary tract includes two kidneys, two ureters, a bladder, and a urethra. Your kidneys are a pair of bean-shaped organs. Each kidney is about the size of your fist. They are located below your ribs, one on each side of your spine. CAUSES Infections are caused by microbes, which are microscopic organisms, including fungi, viruses, and bacteria. These organisms are so small that they can only be seen through a microscope. Bacteria are the microbes that most commonly cause UTIs. SYMPTOMS  Symptoms of UTIs may vary by age and gender of the patient and by the location of the infection. Symptoms in young women typically include a frequent and intense urge to urinate and a painful, burning feeling in the bladder or urethra during urination. Older women and men are more likely to be tired, shaky, and weak and have muscle aches and abdominal pain. A fever may mean the infection is in your kidneys. Other symptoms of a kidney infection include pain in your back or sides below the ribs, nausea, and vomiting. DIAGNOSIS To diagnose a UTI, your caregiver will ask you about your symptoms. Your caregiver will also ask you to provide a urine sample. The urine sample will be tested for bacteria and white blood cells. White blood cells are made by your body to help fight infection. TREATMENT  Typically, UTIs can be treated with medication. Because most UTIs are caused by a bacterial infection, they usually can be treated with the use of antibiotics. The choice of antibiotic and length of treatment depend on your symptoms and the type of bacteria causing your infection. HOME CARE INSTRUCTIONS  If you were prescribed antibiotics, take them exactly as your caregiver instructs you. Finish the medication even if you feel better after you have only taken some of the medication.  Drink enough water and fluids to keep your urine clear or pale yellow.  Avoid caffeine, tea, and carbonated  beverages. They tend to irritate your bladder.  Empty your bladder often. Avoid holding urine for long periods of time.  Empty your bladder before and after sexual intercourse.  After a bowel movement, women should cleanse from front to back. Use each tissue only once. SEEK MEDICAL CARE IF:   You have back pain.  You develop a fever.  Your symptoms do not begin to resolve within 3 days. SEEK IMMEDIATE MEDICAL CARE IF:   You have severe back pain or lower abdominal pain.  You develop chills.  You have nausea or vomiting.  You have continued burning or discomfort with urination. MAKE SURE YOU:   Understand these instructions.  Will watch your condition.  Will get help right away if you are not doing well or get worse.   This information is not intended to replace advice given to you by your health care provider. Make sure you discuss any questions you have with your health care provider.   Document Released: 04/05/2005 Document Revised: 03/17/2015 Document Reviewed: 08/04/2011 Elsevier Interactive Patient Education [employer] Elsevier Inc.

## 2015-07-22 NOTE — ED Notes (Signed)
Report called to Venia CarbonMichelle Price, [facility]. Pt to be transported back to facility with family.

## 2015-07-22 NOTE — ED Provider Notes (Signed)
[facility] Emergency Department Provider Note  ____________________________________________  Time seen: Approximately 8:14 PM  I have reviewed the triage vital signs and the nursing notes.   HISTORY  Chief Complaint Unwitnessed Fall   HPI Julie Burch is a 82 y.o. female whose past medical history includes type 1 diabetes, a prior hemorrhagic stroke, frequent UTIs, and memory loss/mild dementia who lives in a nursing facility and who presents by EMS for an unwitnessed fall.  Reportedly she was found down in her bathroom at [facility].  It is unknown how long she was on the floor.  She has a laceration to her forehead and a skin tear on her right forearm.  She remembers falling but does not remember the circumstances and does not know for how long she was down.  She thinks she fell out of her wheelchair.  She complains of pain in her head and her neck and she arrives with a c-collar in place.  She denies chest pain, shortness of breath, abdominal pain, and any pain in her limbs.  She is moving all 4 extremities equally.  She is alert and oriented at this time.   Past Medical History  Diagnosis Date  . Diabetes mellitus without complication (HCC)   . Depression   . Hypertension   . Stroke (HCC)   . Cancer (HCC)     There are no active problems to display for this patient.   History reviewed. No pertinent past surgical history.  Current Outpatient Rx  Name  Route  Sig  Dispense  Refill  . acetaminophen (TYLENOL) 325 MG tablet   Oral   Take 650 mg by mouth every 6 (six) hours as needed for mild pain or moderate pain.         Marland Kitchen amLODipine (NORVASC) 2.5 MG tablet   Oral   Take 2.5 mg by mouth daily.         . cholecalciferol (VITAMIN D) 1000 units tablet   Oral   Take 2,000 Units by mouth daily.         . Coenzyme Q10 (COQ-10) 100 MG CAPS   Oral   Take 100 mg by mouth daily.         Marland Kitchen donepezil (ARICEPT) 5 MG tablet   Oral   Take 5 mg by  mouth at bedtime.         Marland Kitchen esomeprazole (NEXIUM) 40 MG capsule   Oral   Take 40 mg by mouth daily before breakfast.         . hydrALAZINE (APRESOLINE) 50 MG tablet   Oral   Take 50 mg by mouth 2 (two) times daily.         . insulin glargine (LANTUS) 100 UNIT/ML injection   Subcutaneous   Inject 16 Units into the skin at bedtime.         . insulin lispro (HUMALOG) 100 UNIT/ML injection   Subcutaneous   Inject 4-5 Units into the skin 3 (three) times daily. Pt uses 5 units with breakfast, 5 units with lunch, and 4 units with dinner.         . levothyroxine (SYNTHROID, LEVOTHROID) 50 MCG tablet   Oral   Take 50 mcg by mouth daily before breakfast.         . losartan (COZAAR) 25 MG tablet   Oral   Take 25 mg by mouth daily.         Marland Kitchen omega-3 acid ethyl esters (LOVAZA) 1 g capsule  Oral   Take 1 g by mouth daily.         . QUEtiapine (SEROQUEL) 50 MG tablet   Oral   Take 100 mg by mouth at bedtime.         . sertraline (ZOLOFT) 100 MG tablet   Oral   Take 150 mg by mouth daily.         . vitamin B-12 (CYANOCOBALAMIN) 1000 MCG tablet   Oral   Take 1,000 mcg by mouth daily.         . cefdinir (OMNICEF) 300 MG capsule   Oral   Take 1 capsule (300 mg total) by mouth 2 (two) times daily. Patient not taking: Reported on 07/22/2015   14 capsule   0   . cephALEXin (KEFLEX) 500 MG capsule   Oral   Take 1 capsule (500 mg total) by mouth 2 (two) times daily.   14 capsule   0   . traMADol (ULTRAM) 50 MG tablet   Oral   Take 1 tablet (50 mg total) by mouth every 6 (six) hours as needed for moderate pain or severe pain.   20 tablet   0     Allergies Aspirin; Ciprofloxacin; Diovan; Fosamax; and Sulfa antibiotics  No family history on file.  Social History Social History  Substance Use Topics  . Smoking status: Never Smoker   . Smokeless tobacco: None  . Alcohol Use: No    Review of Systems Constitutional: No fever/chills Eyes: No visual  changes. ENT: No sore throat. Cardiovascular: Denies chest pain. Respiratory: Denies shortness of breath. Gastrointestinal: No abdominal pain.  No nausea, no vomiting.  No diarrhea.  No constipation. Genitourinary: Negative for dysuria. Musculoskeletal: Neck pain, no extremity injuries Skin: Laceration to forehead and right distal forearm Neurological: Headache status post fall and trauma  10-point ROS otherwise negative.  ____________________________________________   PHYSICAL EXAM:  ED Triage Vitals  Enc Vitals Group     BP 07/22/15 2034 198/63 mmHg     Pulse Rate 07/22/15 2034 72     Resp 07/22/15 2034 15     Temp 07/22/15 2019 97.7 F (36.5 C)     Temp Source 07/22/15 2019 Oral     SpO2 07/22/15 2034 97 %     Weight 07/22/15 2034 123 lb 8 oz (56.019 kg)     Height 07/22/15 2034 5\' 2"  (1.575 m)     Head Cir --      Peak Flow --      Pain Score 07/22/15 2019 6     Pain Loc --      Pain Edu? --      Excl. in GC? --     Constitutional: Alert and oriented. Well appearing and in no acute distress. Eyes: Conjunctivae are normal. PERRL. EOMI. Head: 3 cm vertical Skin tear to forehead with small surrounding hematoma.  No battle sign or raccoon eyes.  No other head injury appreciated. Nose: No congestion/rhinnorhea. Mouth/Throat: Mucous membranes are moist.  Oropharynx non-erythematous.  No dental or other intraoral injuries. Neck: No stridor.  C-collar in place.  No cervical spine tenderness to palpation but paraspinal soft tissue tenderness. Cardiovascular: Normal rate, regular rhythm. Grossly normal heart sounds.  Good peripheral circulation. Respiratory: Normal respiratory effort.  No retractions. Lungs CTAB. Gastrointestinal: Soft and nontender. No distention. No abdominal bruits. No CVA tenderness. Musculoskeletal: No lower extremity tenderness nor edema.  No joint effusions. Neurologic:  Normal speech and language. No gross focal neurologic deficits are  appreciated.   Skin:  Less than 2 cm skin tear superficial distal right forearm. Psychiatric: Mood and affect are normal. Speech and behavior are normal.  ____________________________________________   LABS (all labs ordered are listed, but only abnormal results are displayed)  Labs Reviewed  COMPREHENSIVE METABOLIC PANEL - Abnormal; Notable for the following:    Glucose, Bld 396 (*)    BUN 30 (*)    Creatinine, Ser 1.29 (*)    GFR calc non Af Amer 36 (*)    GFR calc Af Amer 42 (*)    All other components within normal limits  CBC WITH DIFFERENTIAL/PLATELET - Abnormal; Notable for the following:    RBC 3.79 (*)    Hemoglobin 11.1 (*)    HCT 34.2 (*)    RDW 16.1 (*)    Lymphs Abs 0.8 (*)    All other components within normal limits  URINALYSIS COMPLETEWITH MICROSCOPIC ([facility] ONLY) - Abnormal; Notable for the following:    Color, Urine STRAW (*)    APPearance CLEAR (*)    Glucose, UA >500 (*)    Nitrite POSITIVE (*)    Bacteria, UA RARE (*)    All other components within normal limits  GLUCOSE, CAPILLARY - Abnormal; Notable for the following:    Glucose-Capillary 326 (*)    All other components within normal limits  URINE CULTURE  CK  TROPONIN I  MAGNESIUM  CBG MONITORING, ED    ____________________________________________   ED ECG REPORT I, Anissia Wessells, the attending physician, personally viewed and interpreted this ECG.   Date: 07/22/2015  EKG Time: 20:33  Rate: 71  Rhythm: normal sinus rhythm  Axis: Normal  Intervals:Normal  ST&T Change: Non-specific ST segment / T-wave changes, but no evidence of acute ischemia.   ____________________________________________  RADIOLOGY   Ct Head Wo Contrast  07/22/2015  CLINICAL DATA:  Unwitnessed fall. Found down. Forehead laceration. Patient reports pain in head and neck. EXAM: CT HEAD WITHOUT CONTRAST CT CERVICAL SPINE WITHOUT CONTRAST TECHNIQUE: Multidetector CT imaging of the head and cervical spine was performed following the  standard protocol without intravenous contrast. Multiplanar CT image reconstructions of the cervical spine were also generated. COMPARISON:  Head and cervical spine CT 09/14/2014 FINDINGS: CT HEAD FINDINGS No intracranial hemorrhage, mass effect, or midline shift. Advanced atrophy and moderate chronic small vessel ischemia, stable in degree from prior. No hydrocephalus. The basilar cisterns are patent. No evidence of territorial infarct. No intracranial fluid collection. Atherosclerosis of skullbase vasculature. Calvarium is intact. Included paranasal sinuses and mastoid air cells are well aerated. CT CERVICAL SPINE FINDINGS No acute fracture or subluxation. Stable 3 mm anterolisthesis of C6 on C7, likely degenerative. Advanced multilevel degenerative disc disease with disc space narrowing and endplate spurring. Degenerative change at the C1-C2 articulation. Multilevel facet arthropathy. Osseous density in the dorsal spinal canal at C6-C7 level is unchanged. No prevertebral soft tissue edema. Advanced vascular calcifications. IMPRESSION: 1. No acute intracranial abnormality. Stable atrophy and chronic small vessel ischemia. 2. Stable multilevel degenerative change throughout cervical spine without acute fracture or subluxation. Electronically Signed   By: Rubye Oaks M.D.   On: 07/22/2015 20:50   Ct Cervical Spine Wo Contrast  07/22/2015  CLINICAL DATA:  Unwitnessed fall. Found down. Forehead laceration. Patient reports pain in head and neck. EXAM: CT HEAD WITHOUT CONTRAST CT CERVICAL SPINE WITHOUT CONTRAST TECHNIQUE: Multidetector CT imaging of the head and cervical spine was performed following the standard protocol without intravenous contrast. Multiplanar CT image reconstructions of the  cervical spine were also generated. COMPARISON:  Head and cervical spine CT 09/14/2014 FINDINGS: CT HEAD FINDINGS No intracranial hemorrhage, mass effect, or midline shift. Advanced atrophy and moderate chronic small  vessel ischemia, stable in degree from prior. No hydrocephalus. The basilar cisterns are patent. No evidence of territorial infarct. No intracranial fluid collection. Atherosclerosis of skullbase vasculature. Calvarium is intact. Included paranasal sinuses and mastoid air cells are well aerated. CT CERVICAL SPINE FINDINGS No acute fracture or subluxation. Stable 3 mm anterolisthesis of C6 on C7, likely degenerative. Advanced multilevel degenerative disc disease with disc space narrowing and endplate spurring. Degenerative change at the C1-C2 articulation. Multilevel facet arthropathy. Osseous density in the dorsal spinal canal at C6-C7 level is unchanged. No prevertebral soft tissue edema. Advanced vascular calcifications. IMPRESSION: 1. No acute intracranial abnormality. Stable atrophy and chronic small vessel ischemia. 2. Stable multilevel degenerative change throughout cervical spine without acute fracture or subluxation. Electronically Signed   By: Rubye Oaks M.D.   On: 07/22/2015 20:50    ____________________________________________   PROCEDURES  Procedure(s) performed: laceration repair with skin adhesive and steri-strips, see procedure note(s).   LACERATION REPAIR Performed by: Loleta Rose Authorized by: Loleta Rose Consent: Verbal consent obtained. Risks and benefits: risks, benefits and alternatives were discussed Consent given by: patient Patient identity confirmed: provided demographic data Prepped and Draped in normal sterile fashion Wound explored  Laceration Location: forehead  Laceration Length: 3 cm  No Foreign Bodies seen or palpated  Irrigation method: syringe Amount of cleaning: standard  Skin closure: Skin adhesive followed by steri-strips  Patient tolerance: Patient tolerated the procedure well with no immediate complications.   LACERATION REPAIR Performed by: Loleta Rose Authorized by: Loleta Rose Consent: Verbal consent obtained. Risks and  benefits: risks, benefits and alternatives were discussed Consent given by: patient Patient identity confirmed: provided demographic data Prepped and Draped in normal sterile fashion Wound explored  Laceration Location: right distal forearm  Laceration Length: <2 cm  No Foreign Bodies seen or palpated  Irrigation method: syringe Amount of cleaning: standard  Skin closure: Steri-Strips  Patient tolerance: Patient tolerated the procedure well with no immediate complications.    Critical Care performed: No ____________________________________________   INITIAL IMPRESSION / ASSESSMENT AND PLAN / ED COURSE  Pertinent labs & imaging results that were available during my care of the patient were reviewed by me and considered in my medical decision making (see chart for details).  Unwitnessed fall with headache and neck pain.  Emergent CT head and C-spine, then further assessment.  Hypertensive but in pain, will reassess after analgesia.  ----------------------------------------- 9:26 PM on 07/22/2015 -----------------------------------------  Family is present and I spoke with them and with the patient extensively.  I have cleared her C-spine and removed her c-collar both radiographically and by examination.  The patient has no pain or tenderness with range of motion including flexion and extension even though her paraspinal muscles are tender.  She had an extensive ear repair i.e. ENT less than a year ago and though I do not see specific documentation about tetanus status it is very probable that her tetanus is up to date.    I will work her up for sources of infection, electrolyte imbalances, rhabdo, cardiac causes, etc.  Family is in agreement.  Giving morphine and Zofran, patient is able to joke with [place] and family is reassured by her current mental status.  ----------------------------------------- 11:52 PM on 07/22/2015 -----------------------------------------  (Note that  documentation was delayed due to multiple ED patients  requiring immediate care.)   The patient is in good spirits in spite of some musculoskeletal pain and a slight residual headache.  Her workup was unremarkable.  Her urinalysis is normal except for being nitrite positive.  I shared this information with her daughter and son-in-law and they said that because she is so prone to severe urinary tract infections they would prefer we go ahead and treat it, which I feel is appropriate.  I prescribed Keflex 500 mg twice a day 7 days for this.  I am also prescribing tramadol for her anticipated musculoskeletal pain and discomfort after fall.  She has no evidence of any cardiac abnormalities and at no point has she had any chest pain or shortness of breath.  Her metabolic panel and CK are within normal limits.  I provided the reassuring results to the family and they are comfortable taking her back to her living facility.  I gave my usual and customary return precautions.     ____________________________________________  FINAL CLINICAL IMPRESSION(S) / ED DIAGNOSES  Final diagnoses:  Forehead contusion, initial encounter  Forehead laceration, initial encounter  Skin tear of right forearm without complication, initial encounter  UTI (lower urinary tract infection)      NEW MEDICATIONS STARTED DURING THIS VISIT:  New Prescriptions   CEPHALEXIN (KEFLEX) 500 MG CAPSULE    Take 1 capsule (500 mg total) by mouth 2 (two) times daily.   TRAMADOL (ULTRAM) 50 MG TABLET    Take 1 tablet (50 mg total) by mouth every 6 (six) hours as needed for moderate pain or severe pain.     Loleta Rose, MD 07/22/15 [phone]

## 2015-07-25 LAB — URINE CULTURE: SPECIAL REQUESTS: NORMAL

## 2015-07-26 NOTE — Progress Notes (Signed)
ED Antimicrobial Stewardship Positive Culture Follow Up   Lynnell DikeMary Sek is an 82 y.o. female [facility] with a chief complaint of  Chief Complaint  Patient presents with  . Fall    Recent Results (from the past 720 hour(s))  Urine culture     Status: None   Collection Time: 07/22/15  8:58 PM  Result Value Ref Range Status   Specimen Description URINE, RANDOM  Final   Special Requests Normal  Final   Culture >=100,000 COLONIES/mL CITROBACTER FREUNDII  Final   Report Status 07/25/2015 FINAL  Final   Organism ID, Bacteria CITROBACTER FREUNDII  Final      Susceptibility   Citrobacter freundii - MIC*    CEFTAZIDIME 32 RESISTANT Resistant     CEFAZOLIN >=64 RESISTANT Resistant     CEFTRIAXONE 8 SENSITIVE Sensitive     CIPROFLOXACIN <=0.25 SENSITIVE Sensitive     GENTAMICIN <=1 SENSITIVE Sensitive     IMIPENEM 1 SENSITIVE Sensitive     TRIMETH/SULFA <=20 SENSITIVE Sensitive     NITROFURANTOIN Value in next row Sensitive      SENSITIVE32    PIP/TAZO Value in next row Sensitive      SENSITIVE16    * >=100,000 COLONIES/mL CITROBACTER FREUNDII    [x]  Treated with cephalexin 500 mg po BID, organism resistant to prescribed antimicrobial []  Patient discharged originally without antimicrobial agent and treatment is now indicated  New antibiotic prescription: Contacted patient's facility [facility]) with culture results and made recommendation for Cefdinir 300 mg po BID x 7 days based on patient allergies and sensitivities.    Daemian Gahm G 07/26/2015, 4:42 PM Infectious Diseases Pharmacist Phone# [phone]

## 2015-10-26 ENCOUNTER — Encounter: Payer: Self-pay | Admitting: Intensive Care

## 2015-10-26 ENCOUNTER — Emergency Department: Payer: Medicare Other

## 2015-10-26 ENCOUNTER — Emergency Department
Admission: EM | Admit: 2015-10-26 | Discharge: 2015-10-26 | Disposition: A | Discharge status: Home / Self Care | Payer: Medicare Other | Attending: Emergency Medicine | Admitting: Emergency Medicine

## 2015-10-26 DIAGNOSIS — F329 Major depressive disorder, single episode, unspecified: Secondary | ICD-10-CM | POA: Insufficient documentation

## 2015-10-26 DIAGNOSIS — W1839XA Other fall on same level, initial encounter: Secondary | ICD-10-CM | POA: Diagnosis not present

## 2015-10-26 DIAGNOSIS — Y999 Unspecified external cause status: Secondary | ICD-10-CM | POA: Insufficient documentation

## 2015-10-26 DIAGNOSIS — Y92129 Unspecified place in nursing home as the place of occurrence of the external cause: Secondary | ICD-10-CM | POA: Diagnosis not present

## 2015-10-26 DIAGNOSIS — E162 Hypoglycemia, unspecified: Secondary | ICD-10-CM

## 2015-10-26 DIAGNOSIS — Y939 Activity, unspecified: Secondary | ICD-10-CM | POA: Insufficient documentation

## 2015-10-26 DIAGNOSIS — E11649 Type 2 diabetes mellitus with hypoglycemia without coma: Secondary | ICD-10-CM | POA: Insufficient documentation

## 2015-10-26 DIAGNOSIS — Z8673 Personal history of transient ischemic attack (TIA), and cerebral infarction without residual deficits: Secondary | ICD-10-CM | POA: Insufficient documentation

## 2015-10-26 DIAGNOSIS — Z79899 Other long term (current) drug therapy: Secondary | ICD-10-CM | POA: Diagnosis not present

## 2015-10-26 DIAGNOSIS — S0081XA Abrasion of other part of head, initial encounter: Secondary | ICD-10-CM | POA: Diagnosis not present

## 2015-10-26 DIAGNOSIS — Z23 Encounter for immunization: Secondary | ICD-10-CM | POA: Diagnosis not present

## 2015-10-26 DIAGNOSIS — Z794 Long term (current) use of insulin: Secondary | ICD-10-CM | POA: Insufficient documentation

## 2015-10-26 DIAGNOSIS — Z859 Personal history of malignant neoplasm, unspecified: Secondary | ICD-10-CM | POA: Insufficient documentation

## 2015-10-26 DIAGNOSIS — I10 Essential (primary) hypertension: Secondary | ICD-10-CM | POA: Diagnosis not present

## 2015-10-26 DIAGNOSIS — Z792 Long term (current) use of antibiotics: Secondary | ICD-10-CM | POA: Insufficient documentation

## 2015-10-26 DIAGNOSIS — S0990XA Unspecified injury of head, initial encounter: Secondary | ICD-10-CM | POA: Diagnosis present

## 2015-10-26 LAB — COMPREHENSIVE METABOLIC PANEL
ALBUMIN: 4 g/dL (ref 3.5–5.0)
ALT: 13 U/L — ABNORMAL LOW (ref 14–54)
ANION GAP: 8 (ref 5–15)
AST: 26 U/L (ref 15–41)
Alkaline Phosphatase: 54 U/L (ref 38–126)
BUN: 29 mg/dL — AB (ref 6–20)
CHLORIDE: 104 mmol/L (ref 101–111)
CO2: 27 mmol/L (ref 22–32)
Calcium: 9.4 mg/dL (ref 8.9–10.3)
Creatinine, Ser: 1.37 mg/dL — ABNORMAL HIGH (ref 0.44–1.00)
GFR calc Af Amer: 39 mL/min — ABNORMAL LOW (ref >60)
GFR, EST NON AFRICAN AMERICAN: 34 mL/min — AB (ref >60)
GLUCOSE: 84 mg/dL (ref 65–99)
POTASSIUM: 4.4 mmol/L (ref 3.5–5.1)
Sodium: 139 mmol/L (ref 135–145)
Total Bilirubin: 0.5 mg/dL (ref 0.3–1.2)
Total Protein: 7.1 g/dL (ref 6.5–8.1)

## 2015-10-26 LAB — URINALYSIS COMPLETE WITH MICROSCOPIC (ARMC ONLY)
BACTERIA UA: NONE SEEN
Bilirubin Urine: NEGATIVE
GLUCOSE, UA: NEGATIVE mg/dL
HGB URINE DIPSTICK: NEGATIVE
KETONES UR: NEGATIVE mg/dL
LEUKOCYTES UA: NEGATIVE
NITRITE: NEGATIVE
Protein, ur: NEGATIVE mg/dL
RBC / HPF: NONE SEEN RBC/hpf (ref 0–5)
SPECIFIC GRAVITY, URINE: 1.009 (ref 1.005–1.030)
pH: 7 (ref 5.0–8.0)

## 2015-10-26 LAB — GLUCOSE, CAPILLARY
GLUCOSE-CAPILLARY: 49 mg/dL — AB (ref 65–99)
Glucose-Capillary: 87 mg/dL (ref 65–99)
Glucose-Capillary: 96 mg/dL (ref 65–99)
Glucose-Capillary: 145 mg/dL — ABNORMAL HIGH (ref 65–99)
Glucose-Capillary: 171 mg/dL — ABNORMAL HIGH (ref 65–99)

## 2015-10-26 LAB — CBC WITH DIFFERENTIAL/PLATELET
BASOS ABS: 0 10*3/uL (ref 0–0.1)
BASOS PCT: 1 %
EOS PCT: 3 %
Eosinophils Absolute: 0.1 10*3/uL (ref 0–0.7)
HCT: 37.9 % (ref 35.0–47.0)
Hemoglobin: 12.7 g/dL (ref 12.0–16.0)
Lymphocytes Relative: 19 %
Lymphs Abs: 1 10*3/uL (ref 1.0–3.6)
MCH: 30.6 pg (ref 26.0–34.0)
MCHC: 33.4 g/dL (ref 32.0–36.0)
MCV: 91.6 fL (ref 80.0–100.0)
MONO ABS: 0.3 10*3/uL (ref 0.2–0.9)
Monocytes Relative: 7 %
Neutro Abs: 3.7 10*3/uL (ref 1.4–6.5)
Neutrophils Relative %: 70 %
PLATELETS: 182 10*3/uL (ref 150–440)
RBC: 4.14 MIL/uL (ref 3.80–5.20)
RDW: 16.2 % — AB (ref 11.5–14.5)
WBC: 5.3 10*3/uL (ref 3.6–11.0)

## 2015-10-26 LAB — TSH: TSH: 0.738 u[IU]/mL (ref 0.350–4.500)

## 2015-10-26 LAB — LIPASE, BLOOD: Lipase: 21 U/L (ref 11–51)

## 2015-10-26 LAB — TROPONIN I: TROPONIN I: 0.03 ng/mL (ref <0.031)

## 2015-10-26 MED ORDER — TETANUS-DIPHTH-ACELL PERTUSSIS 5-2.5-18.5 LF-MCG/0.5 IM SUSP
0.5000 mL | Freq: Once | INTRAMUSCULAR | Status: AC
  Administered 2015-10-26: 0.5 mL via INTRAMUSCULAR
  Filled 2015-10-26: qty 0.5

## 2015-10-26 NOTE — ED Provider Notes (Signed)
-----------------------------------------   5:57 PM on 10/26/2015 -----------------------------------------  Patient's blood glucose has remained elevated. Patient appears well, family's wishing to take her home. I believe the patient is safe to be discharged at this time with PCP follow-up.  Minna AntisKevin Benoit Meech, MD 10/26/15 1757

## 2015-10-26 NOTE — ED Provider Notes (Signed)
[facility] Emergency Department Provider Note  ____________________________________________  Time seen: Seen upon arrival to the emergency department  I have reviewed the triage vital signs and the nursing notes.   HISTORY  Chief Complaint Fall   HPI Julie Burch is a 82 y.o. female with a history of diabetes and hypertension who is presenting to the emergency department after a fall this morning. Per EMS the patient was found in her room at assisted living. The last thing the patient recalls doing was trying to get up from her toilet seat and then EMS at her bedside. It is unclear the amount of downtime that she had. However, she was found by the staff at her assisted living and her glucose was in the 30s. She was given 2 sugar pills and the glucose has resolved now but the patient is still feeling lethargic. She is denying any fever. Says that she has some pain to her forehead where she hasn't abrasion that was sustained this morning when she fell. She denies any chest pain or shortness of breath. Denies any focal weakness but feels generally weak.Does not remember if there've been any recent medication changes or she had changed any of her mealtimes or had not eaten today.   Past Medical History  Diagnosis Date  . Diabetes mellitus without complication (HCC)   . Depression   . Hypertension   . Stroke (HCC)   . Cancer (HCC)     There are no active problems to display for this patient.   History reviewed. No pertinent past surgical history.  Current Outpatient Rx  Name  Route  Sig  Dispense  Refill  . acetaminophen (TYLENOL) 500 MG tablet   Oral   Take 1,000 mg by mouth every 4 (four) hours as needed for mild pain or headache.         Marland Kitchen amLODipine (NORVASC) 2.5 MG tablet   Oral   Take 2.5 mg by mouth daily.         . cholecalciferol (VITAMIN D) 1000 units tablet   Oral   Take 2,000 Units by mouth daily.         . Coenzyme Q10 (COQ-10) 100  MG CAPS   Oral   Take 100 mg by mouth daily.         . D-MANNOSE PO   Oral   Take 1 tablet by mouth daily.         Marland Kitchen donepezil (ARICEPT) 5 MG tablet   Oral   Take 5 mg by mouth at bedtime.         Marland Kitchen esomeprazole (NEXIUM) 40 MG capsule   Oral   Take 40 mg by mouth daily before breakfast.         . Estradiol (YUVAFEM) 10 MCG TABS vaginal tablet   Vaginal   Place 1 tablet vaginally 2 (two) times a week. Pt uses on Monday and Friday.         . fosfomycin (MONUROL) 3 g PACK   Oral   Take 3 g by mouth See admin instructions. Pt is to take once every ten days.         Marland Kitchen glucose 4 GM chewable tablet   Oral   Chew 4 tablets by mouth as needed (for blood sugar less than 80).         . hydrALAZINE (APRESOLINE) 50 MG tablet   Oral   Take 50 mg by mouth 2 (two) times daily.         Marland Kitchen  insulin glargine (LANTUS) 100 UNIT/ML injection   Subcutaneous   Inject 17 Units into the skin at bedtime.          . insulin lispro (HUMALOG) 100 UNIT/ML injection   Subcutaneous   Inject 4-5 Units into the skin 3 (three) times daily. Pt uses 5 units after breakfast, 5 units before lunch, and 4 units before dinner.         . levothyroxine (SYNTHROID, LEVOTHROID) 50 MCG tablet   Oral   Take 50 mcg by mouth daily before breakfast.         . losartan (COZAAR) 25 MG tablet   Oral   Take 25 mg by mouth daily.         . mupirocin ointment (BACTROBAN) 2 %   Nasal   Place 1 application into the nose daily.         . ondansetron (ZOFRAN) 4 MG tablet   Oral   Take 4 mg by mouth every 8 (eight) hours as needed for nausea or vomiting.         Marland Kitchen Propylene Glycol (SYSTANE BALANCE) 0.6 % SOLN   Both Eyes   Place 1 drop into both eyes 2 (two) times daily.         . QUEtiapine (SEROQUEL) 50 MG tablet   Oral   Take 150 mg by mouth at bedtime.          . sertraline (ZOLOFT) 100 MG tablet   Oral   Take 150 mg by mouth daily.         . vitamin B-12 (CYANOCOBALAMIN)  1000 MCG tablet   Oral   Take 1,000 mcg by mouth daily.         . cephALEXin (KEFLEX) 500 MG capsule   Oral   Take 1 capsule (500 mg total) by mouth 2 (two) times daily. Patient not taking: Reported on 10/26/2015   14 capsule   0   . traMADol (ULTRAM) 50 MG tablet   Oral   Take 1 tablet (50 mg total) by mouth every 6 (six) hours as needed for moderate pain or severe pain. Patient not taking: Reported on 10/26/2015   20 tablet   0     Allergies Aspirin; Ciprofloxacin; Diovan; Fosamax; and Sulfa antibiotics  History reviewed. No pertinent family history.  Social History Social History  Substance Use Topics  . Smoking status: Never Smoker   . Smokeless tobacco: Never Used  . Alcohol Use: No    Review of Systems Constitutional: No fever/chills Eyes: No visual changes. ENT: No sore throat. Cardiovascular: Denies chest pain. Respiratory: Denies shortness of breath. Gastrointestinal: No abdominal pain.  No nausea, no vomiting.  No diarrhea.  No constipation. Genitourinary: Negative for dysuria. Musculoskeletal: Negative for back pain. Skin: Negative for rash. Neurological: Negative for focal weakness or numbness.  10-point ROS otherwise negative.  ____________________________________________   PHYSICAL EXAM:  VITAL SIGNS: ED Triage Vitals  Enc Vitals Group     BP --      Pulse --      Resp --      Temp --      Temp src --      SpO2 --      Weight --      Height --      Head Cir --      Peak Flow --      Pain Score --      Pain Loc --  Pain Edu? --      Excl. in GC? --     Constitutional: Alert and oriented. Well appearing and in no acute distress. Eyes: Conjunctivae are normal. PERRL. EOMI. Head: Right frontal abrasion which is 1 x 2 cm and ovoid over the right for a period superficial. Nose: No congestion/rhinnorhea. Mouth/Throat: Mucous membranes are moist.  Oropharynx non-erythematous. Neck: No stridor.  Very mild tenderness to the C-spine  without any step off or to 40. Patient ranging her neck freely. Cardiovascular: Normal rate, regular rhythm. Grossly normal heart sounds.  Good peripheral circulation. Respiratory: Normal respiratory effort.  No retractions. Lungs CTAB. Gastrointestinal: Soft and nontender. No distention. No CVA tenderness. Musculoskeletal: No lower extremity tenderness nor edema.  No joint effusions. Neurologic:  Normal speech and language. No gross focal neurologic deficits are appreciated.  Skin:  Skin is warm, dry and intact. No rash noted. Psychiatric: Mood and affect are normal. Speech and behavior are normal.  ____________________________________________   LABS (all labs ordered are listed, but only abnormal results are displayed)  Labs Reviewed  CBC WITH DIFFERENTIAL/PLATELET - Abnormal; Notable for the following:    RDW 16.2 (*)    All other components within normal limits  COMPREHENSIVE METABOLIC PANEL - Abnormal; Notable for the following:    BUN 29 (*)    Creatinine, Ser 1.37 (*)    ALT 13 (*)    GFR calc non Af Amer 34 (*)    GFR calc Af Amer 39 (*)    All other components within normal limits  URINALYSIS COMPLETEWITH MICROSCOPIC ([facility] ONLY) - Abnormal; Notable for the following:    Color, Urine STRAW (*)    APPearance CLEAR (*)    Squamous Epithelial / LPF 0-5 (*)    All other components within normal limits  GLUCOSE, CAPILLARY - Abnormal; Notable for the following:    Glucose-Capillary 49 (*)    All other components within normal limits  GLUCOSE, CAPILLARY - Abnormal; Notable for the following:    Glucose-Capillary 145 (*)    All other components within normal limits  GLUCOSE, CAPILLARY  TSH  TROPONIN I  LIPASE, BLOOD  GLUCOSE, CAPILLARY  CBG MONITORING, ED  CBG MONITORING, ED  CBG MONITORING, ED  CBG MONITORING, ED  CBG MONITORING, ED   ____________________________________________  EKG   ____________________________________________  RADIOLOGY     DG Pelvis  1-2 Views (Final result) Result time: 10/26/15 15:23:52   Final result by Rad Results In Interface (10/26/15 15:23:52)   Narrative:   CLINICAL DATA: Pain following fall  EXAM: PELVIS - 1-2 VIEW  COMPARISON: None.  FINDINGS: There is no evidence of pelvic fracture dislocation. There is mild symmetric narrowing of both hip joints. Bones are osteoporotic. There is widespread arterial vascular calcification.  IMPRESSION: No fracture or dislocation. Bones osteoporotic. Mild symmetric narrowing of both hip joints.   Electronically Signed By: Bretta Bang III M.D. On: 10/26/2015 15:23          CT Head Wo Contrast (Final result) Result time: 10/26/15 14:39:44   Final result by Rad Results In Interface (10/26/15 14:39:44)   Narrative:   CLINICAL DATA: Right forehead injury and neck pain after fall at nursing facility today.  EXAM: CT HEAD WITHOUT CONTRAST  CT CERVICAL SPINE WITHOUT CONTRAST  TECHNIQUE: Multidetector CT imaging of the head and cervical spine was performed following the standard protocol without intravenous contrast. Multiplanar CT image reconstructions of the cervical spine were also generated.  COMPARISON: CT scan of July 22, 2015.  FINDINGS: CT HEAD FINDINGS  Bony calvarium appears intact. Minimal right frontal scalp hematoma is noted. Mild diffuse cortical atrophy is noted. Mild chronic ischemic white matter disease is noted. No mass effect or midline shift is noted. Ventricular size is within normal limits. There is no evidence of mass lesion, hemorrhage or acute infarction.  CT CERVICAL SPINE FINDINGS  Grade 1 anterolisthesis of C6-7 is noted secondary to posterior facet joint hypertrophy. Severe degenerative disc disease is noted at C3-4, C4-5, and C5-6 with anterior and posterior osteophyte formation. No fracture is noted. Visualized lung apices are unremarkable.  IMPRESSION: Minimal right frontal scalp hematoma.  Mild diffuse cortical atrophy. Mild chronic ischemic white matter disease. No acute intracranial abnormality seen.  Multilevel degenerative disc disease. No acute abnormality seen in the cervical spine.   Electronically Signed By: Lupita Raider, M.D. On: 10/26/2015 14:39          CT Cervical Spine Wo Contrast (Final result) Result time: 10/26/15 14:39:44   Final result by Rad Results In Interface (10/26/15 14:39:44)   Narrative:   CLINICAL DATA: Right forehead injury and neck pain after fall at nursing facility today.  EXAM: CT HEAD WITHOUT CONTRAST  CT CERVICAL SPINE WITHOUT CONTRAST  TECHNIQUE: Multidetector CT imaging of the head and cervical spine was performed following the standard protocol without intravenous contrast. Multiplanar CT image reconstructions of the cervical spine were also generated.  COMPARISON: CT scan of July 22, 2015.  FINDINGS: CT HEAD FINDINGS  Bony calvarium appears intact. Minimal right frontal scalp hematoma is noted. Mild diffuse cortical atrophy is noted. Mild chronic ischemic white matter disease is noted. No mass effect or midline shift is noted. Ventricular size is within normal limits. There is no evidence of mass lesion, hemorrhage or acute infarction.  CT CERVICAL SPINE FINDINGS  Grade 1 anterolisthesis of C6-7 is noted secondary to posterior facet joint hypertrophy. Severe degenerative disc disease is noted at C3-4, C4-5, and C5-6 with anterior and posterior osteophyte formation. No fracture is noted. Visualized lung apices are unremarkable.  IMPRESSION: Minimal right frontal scalp hematoma. Mild diffuse cortical atrophy. Mild chronic ischemic white matter disease. No acute intracranial abnormality seen.  Multilevel degenerative disc disease. No acute abnormality seen in the cervical spine.   Electronically Signed By: Lupita Raider, M.D. On: 10/26/2015 14:39          DG Chest 2 View  (Final result) Result time: 10/26/15 15:23:15   Final result by Rad Results In Interface (10/26/15 15:23:15)   Narrative:   CLINICAL DATA: Larey Seat while going to the bathroom hitting RIGHT side, unwitnessed fall, hypertension, diabetes mellitus  EXAM: CHEST 2 VIEW  COMPARISON: None  FINDINGS: Upper normal heart size.  Atherosclerotic calcification aorta.  Mediastinal contours and pulmonary vascularity normal.  Bronchitic changes with minimal bibasilar atelectasis.  Calcified pulmonary granulomata bilaterally.  No acute infiltrate, pleural effusion or pneumothorax.  Bones diffusely demineralized.  IMPRESSION: Bronchitic and granulomatous disease changes with bibasilar atelectasis.   Electronically Signed By: Ulyses Southward M.D. On: 10/26/2015 15:23    ____________________________________________   PROCEDURES   ____________________________________________   INITIAL IMPRESSION / ASSESSMENT AND PLAN / ED COURSE  Pertinent labs & imaging results that were available during my care of the patient were reviewed by me and considered in my medical decision making (see chart for details).  ----------------------------------------- 4:25 PM on 10/26/2015 -----------------------------------------  Family is now the bedside and reported the patient is at her baseline mental status. They believe the patient did not eat  this morning and this is the cause of her low glucose level. They did not report the patient feeling ill lately. They said that there were no changes in the patient's insulin regimen. They did say that the patient, after being found to have a glucose of 40 was given orange juice and sugar tabs. The next time that she was here in the emergency department and she appears to be at this time sustaining her blood glucose. The patient will continue to have a 4 hour off. Signed out to Dr. Lenard LancePaduchowski. Likely disposition home with  family. ____________________________________________   FINAL CLINICAL IMPRESSION(S) / ED DIAGNOSES  Hypoglycemia    Myrna Blazeravid Matthew Gladyse Corvin, MD 10/26/15 [phone]

## 2015-10-26 NOTE — Discharge Instructions (Signed)
Make sure not to skip any meals. It is also advisable to carry with you a packet of peanut butter crackers or some hard candies that you can eat if you feel your sugar getting low. Hypoglycemia Low blood sugar (hypoglycemia) means that the level of sugar in your blood is lower than it should be. Signs of low blood sugar include:  Getting sweaty.  Feeling hungry.  Feeling dizzy or weak.  Feeling sleepier than normal.  Feeling nervous.  Headaches.  Having a fast heartbeat. Low blood sugar can happen fast and can be an emergency. Your doctor can do tests to check your blood sugar level. You can have low blood sugar and not have diabetes. HOME CARE  Check your blood sugar as told by your doctor. If it is less than 70 mg/dl or as told by your doctor, take 1 of the following:  3 to 4 glucose tablets.   cup clear juice.   cup soda pop, not diet.  1 cup milk.  5 to 6 hard candies.  Recheck blood sugar after 15 minutes. Repeat until it is at the right level.  Eat a snack if it is more than 1 hour until the next meal.  Only take medicine as told by your doctor.  Do not skip meals. Eat on time.  Do not drink alcohol except with meals.  Check your blood glucose before driving.  Check your blood glucose before and after exercise.  Always carry treatment with you, such as glucose pills.  Always wear a medical alert bracelet if you have diabetes. GET HELP RIGHT AWAY IF:   Your blood glucose goes below 70 mg/dl or as told by your doctor, and you:  Are confused.  Are not able to swallow.  Pass out (faint).  You cannot treat yourself. You may need someone to help you.  You have low blood sugar problems often.  You have problems from your medicines.  You are not feeling better after 3 to 4 days.  You have vision changes. MAKE SURE YOU:   Understand these instructions.  Will watch this condition.  Will get help right away if you are not doing well or get  worse.   This information is not intended to replace advice given to you by your health care provider. Make sure you discuss any questions you have with your health care provider.   Document Released: 09/20/2009 Document Revised: 07/17/2014 Document Reviewed: 03/02/2015 Elsevier Interactive Patient Education [employer]2016 [employer]

## 2015-12-16 ENCOUNTER — Other Ambulatory Visit
Admission: RE | Admit: 2015-12-16 | Discharge: 2015-12-16 | Disposition: A | Discharge status: Home / Self Care | Payer: Medicare Other | Source: Skilled Nursing Facility | Attending: Internal Medicine | Admitting: Internal Medicine

## 2015-12-16 DIAGNOSIS — R4182 Altered mental status, unspecified: Secondary | ICD-10-CM | POA: Insufficient documentation

## 2015-12-16 LAB — URINALYSIS COMPLETE WITH MICROSCOPIC (ARMC ONLY)
BILIRUBIN URINE: NEGATIVE
Glucose, UA: NEGATIVE mg/dL
Hgb urine dipstick: NEGATIVE
KETONES UR: NEGATIVE mg/dL
NITRITE: NEGATIVE
PROTEIN: NEGATIVE mg/dL
RBC / HPF: NONE SEEN RBC/hpf (ref 0–5)
SPECIFIC GRAVITY, URINE: 1.01 (ref 1.005–1.030)
pH: 5.5 (ref 5.0–8.0)

## 2015-12-19 LAB — URINE CULTURE

## 2016-01-27 ENCOUNTER — Other Ambulatory Visit
Admission: RE | Admit: 2016-01-27 | Discharge: 2016-01-27 | Disposition: A | Discharge status: Home / Self Care | Payer: Medicare Other | Source: Skilled Nursing Facility | Attending: Internal Medicine | Admitting: Internal Medicine

## 2016-01-27 DIAGNOSIS — R7981 Abnormal blood-gas level: Secondary | ICD-10-CM | POA: Insufficient documentation

## 2016-01-27 LAB — URINALYSIS COMPLETE WITH MICROSCOPIC (ARMC ONLY)
Bilirubin Urine: NEGATIVE
Glucose, UA: NEGATIVE mg/dL
HGB URINE DIPSTICK: NEGATIVE
Ketones, ur: NEGATIVE mg/dL
Nitrite: POSITIVE — AB
PH: 5 (ref 5.0–8.0)
RBC / HPF: NONE SEEN RBC/hpf (ref 0–5)
SPECIFIC GRAVITY, URINE: 1.025 (ref 1.005–1.030)

## 2016-01-30 LAB — URINE CULTURE

## 2016-02-05 ENCOUNTER — Encounter: Payer: Self-pay | Admitting: Emergency Medicine

## 2016-02-05 ENCOUNTER — Emergency Department: Payer: Medicare Other

## 2016-02-05 ENCOUNTER — Emergency Department
Admission: EM | Admit: 2016-02-05 | Discharge: 2016-02-06 | Disposition: A | Discharge status: Home / Self Care | Payer: Medicare Other | Attending: Student | Admitting: Student

## 2016-02-05 DIAGNOSIS — E119 Type 2 diabetes mellitus without complications: Secondary | ICD-10-CM | POA: Diagnosis not present

## 2016-02-05 DIAGNOSIS — W19XXXA Unspecified fall, initial encounter: Secondary | ICD-10-CM

## 2016-02-05 DIAGNOSIS — Z8673 Personal history of transient ischemic attack (TIA), and cerebral infarction without residual deficits: Secondary | ICD-10-CM | POA: Diagnosis not present

## 2016-02-05 DIAGNOSIS — Z794 Long term (current) use of insulin: Secondary | ICD-10-CM | POA: Diagnosis not present

## 2016-02-05 DIAGNOSIS — Z792 Long term (current) use of antibiotics: Secondary | ICD-10-CM | POA: Diagnosis not present

## 2016-02-05 DIAGNOSIS — Z87891 Personal history of nicotine dependence: Secondary | ICD-10-CM | POA: Insufficient documentation

## 2016-02-05 DIAGNOSIS — Y999 Unspecified external cause status: Secondary | ICD-10-CM | POA: Insufficient documentation

## 2016-02-05 DIAGNOSIS — Z79899 Other long term (current) drug therapy: Secondary | ICD-10-CM | POA: Diagnosis not present

## 2016-02-05 DIAGNOSIS — Z791 Long term (current) use of non-steroidal anti-inflammatories (NSAID): Secondary | ICD-10-CM | POA: Insufficient documentation

## 2016-02-05 DIAGNOSIS — W1800XA Striking against unspecified object with subsequent fall, initial encounter: Secondary | ICD-10-CM | POA: Insufficient documentation

## 2016-02-05 DIAGNOSIS — S0990XA Unspecified injury of head, initial encounter: Secondary | ICD-10-CM | POA: Insufficient documentation

## 2016-02-05 DIAGNOSIS — F329 Major depressive disorder, single episode, unspecified: Secondary | ICD-10-CM | POA: Diagnosis not present

## 2016-02-05 DIAGNOSIS — I10 Essential (primary) hypertension: Secondary | ICD-10-CM | POA: Insufficient documentation

## 2016-02-05 DIAGNOSIS — Y9289 Other specified places as the place of occurrence of the external cause: Secondary | ICD-10-CM | POA: Insufficient documentation

## 2016-02-05 DIAGNOSIS — Y9389 Activity, other specified: Secondary | ICD-10-CM | POA: Diagnosis not present

## 2016-02-05 NOTE — ED Notes (Signed)
C collar applied by Celine Mans RN and this RN.

## 2016-02-05 NOTE — ED Provider Notes (Signed)
[facility] Emergency Department Provider Note   ____________________________________________   First MD Initiated Contact with Patient 02/05/16 2312     (approximate)  I have reviewed the triage vital signs and the nursing notes.   HISTORY  Chief Complaint Fall  Caveat-history of present illness and review of systems is limited due to the patient's dementia. All information is obtained from her daughter at bedside.  HPI Julie Burch is a 82 y.o. female with history of dementia, hypertension, TIAs who presents for evaluation of fall with head injury that occurred at [facility] assisted living this evening. According to her daughter at bedside, the patient is supposed to require help to get ready for bed. Apparently the patient had been trying to change into her pajamas by herself when she likely leaned over to pick up a sock that had fallen and she fell and hit her head on the nightstand. She was found seated on the floor between the bed and the nightstand. No loss of consciousness. Her blood sugar was 62 which improved to 92 with orange juice.  No vomiting, diarrhea, fevers or chills. No cough. She has chronic urinary tract infections and was recently treated for a UTI but she has otherwise been in her usual state of health without illness.   Past Medical History:  Diagnosis Date  . Depression   . Diabetes mellitus without complication (HCC)   . Hypertension   . Stroke [facility])    TIA    There are no active problems to display for this patient.   Past Surgical History:  Procedure Laterality Date  . TONSILLECTOMY      Prior to Admission medications   Medication Sig Start Date End Date Taking? Authorizing Provider  acetaminophen (TYLENOL) 500 MG tablet Take 1,000 mg by mouth every 4 (four) hours as needed for mild pain or headache.   Yes Historical Provider, MD  amLODipine (NORVASC) 2.5 MG tablet Take 2.5 mg by mouth daily.   Yes Historical Provider,  MD  cholecalciferol (VITAMIN D) 1000 units tablet Take 2,000 Units by mouth daily.   Yes Historical Provider, MD  Coenzyme Q10 (COQ-10) 100 MG CAPS Take 100 mg by mouth daily.   Yes Historical Provider, MD  donepezil (ARICEPT) 5 MG tablet Take 5 mg by mouth at bedtime.   Yes Historical Provider, MD  esomeprazole (NEXIUM) 40 MG capsule Take 40 mg by mouth daily before breakfast.   Yes Historical Provider, MD  fosfomycin (MONUROL) 3 g PACK Take 3 g by mouth See admin instructions. Pt is to take once every ten days.   Yes Historical Provider, MD  glucose 4 GM chewable tablet Chew 4 tablets by mouth as needed (for blood sugar less than 80).   Yes Historical Provider, MD  hydrALAZINE (APRESOLINE) 50 MG tablet Take 50 mg by mouth 2 (two) times daily.   Yes Historical Provider, MD  insulin glargine (LANTUS) 100 UNIT/ML injection Inject 17 Units into the skin at bedtime.    Yes Historical Provider, MD  insulin lispro (HUMALOG) 100 UNIT/ML injection Inject 4-5 Units into the skin 3 (three) times daily. Pt uses 5 units after breakfast, 5 units before lunch, and 4 units before dinner.   Yes Historical Provider, MD  levothyroxine (SYNTHROID, LEVOTHROID) 50 MCG tablet Take 50 mcg by mouth daily before breakfast.   Yes Historical Provider, MD  losartan (COZAAR) 25 MG tablet Take 25 mg by mouth daily.   Yes Historical Provider, MD  ondansetron [facility] 4  MG tablet Take 4 mg by mouth every 8 (eight) hours as needed for nausea or vomiting.   Yes Historical Provider, MD  QUEtiapine (SEROQUEL) 50 MG tablet Take 150 mg by mouth at bedtime.    Yes Historical Provider, MD  sertraline (ZOLOFT) 100 MG tablet Take 150 mg by mouth daily.   Yes Historical Provider, MD  vitamin B-12 (CYANOCOBALAMIN) 1000 MCG tablet Take 1,000 mcg by mouth daily.   Yes Historical Provider, MD    Allergies Aspirin; Ciprofloxacin; Diovan [valsartan]; Fosamax [alendronate sodium]; and Sulfa antibiotics  No family history on file.  Social  History Social History  Substance Use Topics  . Smoking status: Never Smoker  . Smokeless tobacco: Never Used  . Alcohol use No    Review of Systems  Caveat-history of present illness and review of systems is limited due to the patient's dementia. All information is obtained from her daughter at bedside. ____________________________________________   PHYSICAL EXAM:  Vitals:   02/05/16 2300 02/06/16 0000 02/06/16 0100 02/06/16 0133  BP: (!) 178/48 (!) 188/62 (!) 163/49 (!) 165/48  Pulse: 65 73 (!) 58 62  Resp: (!) 21 20 14 18   Temp:      TempSrc:      SpO2: 97% 96% 98% 98%  Weight:      Height:        VITAL SIGNS: ED Triage Vitals  Enc Vitals Group     BP 02/05/16 2202 (!) 182/58     Pulse Rate 02/05/16 2202 63     Resp 02/05/16 2202 (!) 22     Temp 02/05/16 2202 98.5 F (36.9 C)     Temp Source 02/05/16 2202 Oral     SpO2 02/05/16 2202 96 %     Weight 02/05/16 2202 122 lb (55.3 kg)     Height 02/05/16 2202 5' (1.524 m)     Head Circumference --      Peak Flow --      Pain Score 02/05/16 2213 10     Pain Loc --      Pain Edu? --      Excl. in GC? --     Constitutional:Awake and alert, oriented to self, pleasantly demented. Well appearing and in no acute distress. Eyes: Conjunctivae are normal. PERRL. EOMI. Head: Tiny 0.5 cm superficial laceration at the midline of the hairline on the forehead. Nose: No congestion/rhinnorhea. Mouth/Throat: Mucous membranes are moist.  Oropharynx non-erythematous. Neck: No stridor.  No cervical spine tenderness to palpation. Cardiovascular: Normal rate, regular rhythm. Grossly normal heart sounds.  Good peripheral circulation. Respiratory: Normal respiratory effort.  No retractions. Lungs CTAB. Gastrointestinal: Soft and nontender. No distention.  No CVA tenderness. Genitourinary: deferred Musculoskeletal: No lower extremity tenderness nor edema.  No joint effusions. No midline T or L-spine tenderness to palpation, pelvis is  stable to rock and compression, full active painless range of motion of bilateral hip joints. Neurologic:  Normal speech and language. No gross focal neurologic deficits are appreciated. 5 out of 5 strength bilateral upper and lower extremity, sensation intact to light touch throughout. Skin:  Skin is warm, dry and intact. No rash noted. Psychiatric: Mood and affect are normal. Speech and behavior are normal.  ____________________________________________   LABS (all labs ordered are listed, but only abnormal results are displayed)  Labs Reviewed  CBG MONITORING, ED   ____________________________________________  EKG  ED ECG REPORT I, Gayla Doss, the attending physician, personally viewed and interpreted this ECG.   Date: 02/06/2016  EKG Time: 22:11  Rate: 64  Rhythm: normal sinus rhythm  Axis: normal  Intervals:none  ST&T Change: No acute ST elevation or acute ST depression.  ____________________________________________  RADIOLOGY  CT head and c-spine IMPRESSION: No acute intracranial abnormality. Advanced brain parenchymal atrophy and chronic microvascular disease. No evidence of acute traumatic injury to the cervical spine. Advanced multilevel osteoarthritic changes with stable anterolisthesis of C6 on C7. Discogenic dystrophic processes at T1-T2 also stable, and may represent degenerative changes versus chronic discitis.  ____________________________________________   PROCEDURES  Procedure(s) performed:   LACERATION REPAIR Performed by: Gayla Doss Authorized by: Toney Rakes A Consent: Verbal consent obtained. Risks and benefits: risks, benefits and alternatives were discussed Consent given by: patient Patient identity confirmed: provided demographic data Prepped and Draped in normal sterile fashion Wound explored  Laceration Location: forehead  Laceration Length: 0.5 cm  No Foreign Bodies seen or palpated   Irrigation method:  syringe Amount of cleaning: standard  Skin closure: tissue adhesive   Patient tolerance: Patient tolerated the procedure well with no immediate complications.  Procedures  Critical Care performed: No  ____________________________________________   INITIAL IMPRESSION / ASSESSMENT AND PLAN / ED COURSE  Pertinent labs & imaging results that were available during my care of the patient were reviewed by me and considered in my medical decision making (see chart for details).  Zyrah Morath is a 82 y.o. female with history of dementia, hypertension, TIAs who presents for evaluation of fall with head injury that occurred at [facility] this evening after what sounds like a mechanical fall. On exam, the patient is well-appearing and in no acute distress. Her vital signs are stable, she is afebrile. She is at her baseline in terms of mental status. She has a tiny duration at the hairline which I have repaired with Dermabond. The remainder of her examination is atraumatic. Her tetanus is up-to-date. CT head and C-spine are negative for any acute intracranial or cervical spine pathology. Per my discussion with her daughter at bedside, this sounds like a mechanical fall however we discussed that it cannot be confirmed completely because of the patient's severe dementia. I have offered screening labs and urinalysis however the patient's daughter has declined stating that this is her baseline, these falls happen often, and the patient has not been ill. I feel this is reasonable. We'll discharge with return precautions and close PCP follow-up. Family at the bedside are comfortable with this plan. Glucose at the time of discharge is 154.  Clinical Course     ____________________________________________   FINAL CLINICAL IMPRESSION(S) / ED DIAGNOSES  Final diagnoses:  Fall, initial encounter  Minor head injury, initial encounter      NEW MEDICATIONS STARTED DURING THIS  VISIT:  New Prescriptions   No medications on file     Note:  This document was prepared using Dragon voice recognition software and may include unintentional dictation errors.    Gayla Doss, MD 02/06/16 [phone]

## 2016-02-05 NOTE — ED Triage Notes (Signed)
Per [facility], pt had unwitnessed fall at [facility] assisted living. EMS reports that facility stated that pt was found on the floor. EMS reports CBG of 68 then after four glasses of OJ CBG went up to 92. Pt has laceration to right side of head. Pt is alert and oriented at this time.

## 2016-02-05 NOTE — ED Notes (Signed)
Patient left for CT scan. 

## 2016-02-06 DIAGNOSIS — S0990XA Unspecified injury of head, initial encounter: Secondary | ICD-10-CM | POA: Diagnosis not present

## 2016-02-06 LAB — GLUCOSE, CAPILLARY: GLUCOSE-CAPILLARY: 154 mg/dL — AB (ref 65–99)

## 2016-02-06 MED ORDER — ACETAMINOPHEN 500 MG PO TABS
ORAL_TABLET | ORAL | Status: AC
  Administered 2016-02-06: 1000 mg via ORAL
  Filled 2016-02-06: qty 2

## 2016-02-06 MED ORDER — ACETAMINOPHEN 500 MG PO TABS
1000.0000 mg | ORAL_TABLET | Freq: Once | ORAL | Status: AC
  Administered 2016-02-06: 1000 mg via ORAL

## 2016-02-08 ENCOUNTER — Emergency Department: Payer: Medicare Other

## 2016-02-08 ENCOUNTER — Observation Stay
Admission: EM | Admit: 2016-02-08 | Discharge: 2016-02-10 | Payer: Medicare Other | Attending: Specialist | Admitting: Specialist

## 2016-02-08 DIAGNOSIS — B9620 Unspecified Escherichia coli [E. coli] as the cause of diseases classified elsewhere: Secondary | ICD-10-CM | POA: Diagnosis not present

## 2016-02-08 DIAGNOSIS — N390 Urinary tract infection, site not specified: Principal | ICD-10-CM

## 2016-02-08 DIAGNOSIS — E860 Dehydration: Secondary | ICD-10-CM | POA: Diagnosis present

## 2016-02-08 DIAGNOSIS — R451 Restlessness and agitation: Secondary | ICD-10-CM | POA: Diagnosis present

## 2016-02-08 DIAGNOSIS — N189 Chronic kidney disease, unspecified: Secondary | ICD-10-CM | POA: Insufficient documentation

## 2016-02-08 DIAGNOSIS — I129 Hypertensive chronic kidney disease with stage 1 through stage 4 chronic kidney disease, or unspecified chronic kidney disease: Secondary | ICD-10-CM | POA: Diagnosis not present

## 2016-02-08 DIAGNOSIS — F0390 Unspecified dementia without behavioral disturbance: Secondary | ICD-10-CM | POA: Diagnosis not present

## 2016-02-08 DIAGNOSIS — R109 Unspecified abdominal pain: Secondary | ICD-10-CM | POA: Insufficient documentation

## 2016-02-08 DIAGNOSIS — E119 Type 2 diabetes mellitus without complications: Secondary | ICD-10-CM

## 2016-02-08 DIAGNOSIS — I10 Essential (primary) hypertension: Secondary | ICD-10-CM | POA: Diagnosis present

## 2016-02-08 DIAGNOSIS — F32A Depression, unspecified: Secondary | ICD-10-CM | POA: Diagnosis present

## 2016-02-08 DIAGNOSIS — Z888 Allergy status to other drugs, medicaments and biological substances status: Secondary | ICD-10-CM | POA: Diagnosis not present

## 2016-02-08 DIAGNOSIS — Z794 Long term (current) use of insulin: Secondary | ICD-10-CM | POA: Insufficient documentation

## 2016-02-08 DIAGNOSIS — R739 Hyperglycemia, unspecified: Secondary | ICD-10-CM | POA: Diagnosis present

## 2016-02-08 DIAGNOSIS — Z87891 Personal history of nicotine dependence: Secondary | ICD-10-CM | POA: Insufficient documentation

## 2016-02-08 DIAGNOSIS — F329 Major depressive disorder, single episode, unspecified: Secondary | ICD-10-CM | POA: Diagnosis present

## 2016-02-08 DIAGNOSIS — E039 Hypothyroidism, unspecified: Secondary | ICD-10-CM | POA: Insufficient documentation

## 2016-02-08 DIAGNOSIS — K219 Gastro-esophageal reflux disease without esophagitis: Secondary | ICD-10-CM | POA: Insufficient documentation

## 2016-02-08 DIAGNOSIS — M8580 Other specified disorders of bone density and structure, unspecified site: Secondary | ICD-10-CM | POA: Insufficient documentation

## 2016-02-08 DIAGNOSIS — Z8673 Personal history of transient ischemic attack (TIA), and cerebral infarction without residual deficits: Secondary | ICD-10-CM | POA: Insufficient documentation

## 2016-02-08 DIAGNOSIS — Z882 Allergy status to sulfonamides status: Secondary | ICD-10-CM | POA: Diagnosis not present

## 2016-02-08 DIAGNOSIS — Z79899 Other long term (current) drug therapy: Secondary | ICD-10-CM | POA: Insufficient documentation

## 2016-02-08 DIAGNOSIS — Z87442 Personal history of urinary calculi: Secondary | ICD-10-CM | POA: Insufficient documentation

## 2016-02-08 DIAGNOSIS — Z87440 Personal history of urinary (tract) infections: Secondary | ICD-10-CM | POA: Insufficient documentation

## 2016-02-08 DIAGNOSIS — E1165 Type 2 diabetes mellitus with hyperglycemia: Secondary | ICD-10-CM | POA: Insufficient documentation

## 2016-02-08 DIAGNOSIS — E1122 Type 2 diabetes mellitus with diabetic chronic kidney disease: Secondary | ICD-10-CM | POA: Diagnosis not present

## 2016-02-08 DIAGNOSIS — N179 Acute kidney failure, unspecified: Secondary | ICD-10-CM | POA: Insufficient documentation

## 2016-02-08 DIAGNOSIS — Z886 Allergy status to analgesic agent status: Secondary | ICD-10-CM | POA: Diagnosis not present

## 2016-02-08 HISTORY — DX: Gastro-esophageal reflux disease without esophagitis: K21.9

## 2016-02-08 HISTORY — DX: Urinary tract infection, site not specified: N39.0

## 2016-02-08 LAB — URINALYSIS COMPLETE WITH MICROSCOPIC (ARMC ONLY)
BACTERIA UA: NONE SEEN
Bilirubin Urine: NEGATIVE
Nitrite: NEGATIVE
Protein, ur: 30 mg/dL — AB
Specific Gravity, Urine: 1.022 (ref 1.005–1.030)
pH: 5 (ref 5.0–8.0)

## 2016-02-08 LAB — GLUCOSE, CAPILLARY
GLUCOSE-CAPILLARY: 116 mg/dL — AB (ref 65–99)
GLUCOSE-CAPILLARY: 361 mg/dL — AB (ref 65–99)
GLUCOSE-CAPILLARY: 425 mg/dL — AB (ref 65–99)
GLUCOSE-CAPILLARY: 470 mg/dL — AB (ref 65–99)

## 2016-02-08 LAB — CBC
HEMATOCRIT: 35.1 % (ref 35.0–47.0)
Hemoglobin: 11.5 g/dL — ABNORMAL LOW (ref 12.0–16.0)
MCH: 31.8 pg (ref 26.0–34.0)
MCHC: 32.7 g/dL (ref 32.0–36.0)
MCV: 97.2 fL (ref 80.0–100.0)
PLATELETS: 191 10*3/uL (ref 150–440)
RBC: 3.61 MIL/uL — ABNORMAL LOW (ref 3.80–5.20)
RDW: 15.6 % — AB (ref 11.5–14.5)
WBC: 11.6 10*3/uL — ABNORMAL HIGH (ref 3.6–11.0)

## 2016-02-08 LAB — BASIC METABOLIC PANEL
Anion gap: 14 (ref 5–15)
BUN: 43 mg/dL — ABNORMAL HIGH (ref 6–20)
CALCIUM: 8.8 mg/dL — AB (ref 8.9–10.3)
CO2: 21 mmol/L — AB (ref 22–32)
CREATININE: 1.78 mg/dL — AB (ref 0.44–1.00)
Chloride: 95 mmol/L — ABNORMAL LOW (ref 101–111)
GFR calc non Af Amer: 25 mL/min — ABNORMAL LOW (ref >60)
GFR, EST AFRICAN AMERICAN: 29 mL/min — AB (ref >60)
GLUCOSE: 539 mg/dL — AB (ref 65–99)
Potassium: 4.5 mmol/L (ref 3.5–5.1)
Sodium: 130 mmol/L — ABNORMAL LOW (ref 135–145)

## 2016-02-08 MED ORDER — SODIUM CHLORIDE 0.9 % IV BOLUS (SEPSIS): 1000.0000 mL | Freq: Once | INTRAVENOUS | Status: DC

## 2016-02-08 MED ORDER — SODIUM CHLORIDE 0.9 % IV BOLUS (SEPSIS)
1000.0000 mL | Freq: Once | INTRAVENOUS | Status: AC
Start: 2016-02-08 — End: 2016-02-08
  Administered 2016-02-08: 1000 mL via INTRAVENOUS

## 2016-02-08 MED ORDER — ACETAMINOPHEN 500 MG PO TABS
1000.0000 mg | ORAL_TABLET | ORAL | Status: AC
  Administered 2016-02-08: 1000 mg via ORAL
  Filled 2016-02-08: qty 2

## 2016-02-08 MED ORDER — INSULIN ASPART 100 UNIT/ML ~~LOC~~ SOLN
7.0000 [IU] | Freq: Once | SUBCUTANEOUS | Status: AC
  Administered 2016-02-08: 7 [IU] via SUBCUTANEOUS
  Filled 2016-02-08: qty 7

## 2016-02-08 MED ORDER — DEXTROSE 5 % IV SOLN
1.0000 g | Freq: Once | INTRAVENOUS | Status: AC
  Administered 2016-02-08: 1 g via INTRAVENOUS
  Filled 2016-02-08: qty 10

## 2016-02-08 NOTE — ED Triage Notes (Signed)
Pt was seen here Saturday after fall had negative CT scan at that time.  Has had lower back pain since the fall but worsening symptoms today. Pt has frequent UTI's and foul smelling urine.

## 2016-02-08 NOTE — ED Notes (Signed)
Pt was given tylenol pills in apple sauce. Tolerated well. Ate about 1/3 of apple sauce cup.

## 2016-02-08 NOTE — ED Notes (Signed)
Attempted to gain IV access to start fluids. Pt vein blew once and catheter would not thread the second time.

## 2016-02-08 NOTE — ED Notes (Signed)
Pt placed on bedpan, urinated. Pt was cleaned and brief/pants placed back on. Pt wrapped in blankets. Verbalized comfort.

## 2016-02-08 NOTE — ED Provider Notes (Signed)
[facility] Emergency Department Provider Note   ____________________________________________   First MD Initiated Contact with Patient 02/08/16 2142     (approximate)  I have reviewed the triage vital signs and the nursing notes.   HISTORY  Chief Complaint Back Pain    HPI Julie Burch is a 82 y.o. female presents for evaluation of back pain and slight agitation and elevated blood sugar. Patient's daughter and son-in-law are with her report that she's had similar symptoms whenever she developed urinary tract infections.  Today they went to check on her at the assisted living and they noticed that she seemed a little bit more agitated than usual. Her blood sugar was noted be high, and after receiving insulin did not improve much.  Family reports they believe she has no urinary tract infection.  The patient has been reporting some mild discomfort in her right lower back, and they do report she had a history kidney stone the past as well. She felt warm but no fevers been measured. No nausea or vomiting. She is  otherwise been okay.  No cough, chest pain or shortness of breath. No weakness or facial droop or headache.  Past Medical History:  Diagnosis Date  . Depression   . Diabetes mellitus without complication (HCC)   . Hypertension   . Stroke [facility])    TIA    There are no active problems to display for this patient.   Past Surgical History:  Procedure Laterality Date  . TONSILLECTOMY      Prior to Admission medications   Medication Sig Start Date End Date Taking? Authorizing Provider  acetaminophen (TYLENOL) 500 MG tablet Take 1,000 mg by mouth every 4 (four) hours as needed for mild pain or headache.   Yes Historical Provider, MD  amLODipine (NORVASC) 2.5 MG tablet Take 2.5 mg by mouth daily.   Yes Historical Provider, MD  cholecalciferol (VITAMIN D) 1000 units tablet Take 2,000 Units by mouth daily.   Yes Historical Provider, MD  Coenzyme  Q10 (COQ-10) 100 MG CAPS Take 100 mg by mouth daily.   Yes Historical Provider, MD  donepezil (ARICEPT) 5 MG tablet Take 5 mg by mouth at bedtime.   Yes Historical Provider, MD  esomeprazole (NEXIUM) 40 MG capsule Take 40 mg by mouth daily before breakfast.   Yes Historical Provider, MD  fosfomycin (MONUROL) 3 g PACK Take 3 g by mouth See admin instructions. Pt is to take once every ten days.   Yes Historical Provider, MD  glucose 4 GM chewable tablet Chew 4 tablets by mouth as needed (for blood sugar less than 80).   Yes Historical Provider, MD  hydrALAZINE (APRESOLINE) 50 MG tablet Take 50 mg by mouth 2 (two) times daily.   Yes Historical Provider, MD  insulin glargine (LANTUS) 100 UNIT/ML injection Inject 17 Units into the skin at bedtime.    Yes Historical Provider, MD  insulin lispro (HUMALOG) 100 UNIT/ML injection Inject 4-5 Units into the skin 3 (three) times daily. Pt uses 5 units after breakfast, 5 units before lunch, and 4 units before dinner.   Yes Historical Provider, MD  levothyroxine (SYNTHROID, LEVOTHROID) 50 MCG tablet Take 50 mcg by mouth daily before breakfast.   Yes Historical Provider, MD  losartan (COZAAR) 25 MG tablet Take 25 mg by mouth daily.   Yes Historical Provider, MD  ondansetron (ZOFRAN) 4 MG tablet Take 4 mg by mouth every 8 (eight) hours as needed for nausea or vomiting.   Yes  Historical Provider, MD  QUEtiapine (SEROQUEL) 50 MG tablet Take 150 mg by mouth at bedtime.    Yes Historical Provider, MD  sertraline (ZOLOFT) 100 MG tablet Take 150 mg by mouth daily.   Yes Historical Provider, MD  vitamin B-12 (CYANOCOBALAMIN) 1000 MCG tablet Take 1,000 mcg by mouth daily.   Yes Historical Provider, MD    Allergies Aspirin; Ciprofloxacin; Diovan [valsartan]; Fosamax [alendronate sodium]; and Sulfa antibiotics  No family history on file.  Social History Social History  Substance Use Topics  . Smoking status: Never Smoker  . Smokeless tobacco: Never Used  . Alcohol  use No    Review of Systems Constitutional: No fever/chills Eyes: No visual changes. ENT: No sore throat. Cardiovascular: Denies chest pain. Respiratory: Denies shortness of breath. Gastrointestinal: No abdominal pain.  No nausea, no vomiting.  Moderate pain in her right lower back. Genitourinary: Negative for dysuria. Musculoskeletal: Negative for swelling or weakness. Skin: Negative for rash. Neurological: Negative for headaches, focal weakness or numbness.  10-point ROS otherwise negative.  ____________________________________________   PHYSICAL EXAM:  VITAL SIGNS: ED Triage Vitals [02/08/16 1920]  Enc Vitals Group     BP (!) 153/56     Pulse Rate 78     Resp 18     Temp 98.8 F (37.1 C)     Temp Source Oral     SpO2 94 %     Weight 113 lb (51.3 kg)     Height      Head Circumference      Peak Flow      Pain Score      Pain Loc      Pain Edu?      Excl. in GC?     Constitutional: Alert and orientedTo self and daughter, not to date. Fatigued and generally weak appearing but in no distress. Eyes: Conjunctivae are normal. PERRL. EOMI. Head: Atraumatic. Nose: No congestion/rhinnorhea. Mouth/Throat: Mucous membranes are notably dry.  Oropharynx non-erythematous. Neck: No stridor.   Cardiovascular: Normal rate, regular rhythm. Grossly normal heart sounds.  Good peripheral circulation. Respiratory: Normal respiratory effort.  No retractions. Lungs CTAB. Gastrointestinal: Soft and nontender. No distention. No abdominal bruits. There is mild right-sided CVA tenderness. There is a slight ecchymosis over the left lower back and tenderness, family reports this is from her fall a few days ago. Musculoskeletal: No lower extremity tenderness nor edema.  Neurologic:  Normal speech and language. No gross focal neurologic deficits are appreciated.  Skin:  Skin is warm, dry and intact. No rash noted. Psychiatric: Mood and affect are normal. Speech and behavior are  normal.  ____________________________________________   LABS (all labs ordered are listed, but only abnormal results are displayed)  Labs Reviewed  CBC - Abnormal; Notable for the following:       Result Value   WBC 11.6 (*)    RBC 3.61 (*)    Hemoglobin 11.5 (*)    RDW 15.6 (*)    All other components within normal limits  BASIC METABOLIC PANEL - Abnormal; Notable for the following:    Sodium 130 (*)    Chloride 95 (*)    CO2 21 (*)    Glucose, Bld 539 (*)    BUN 43 (*)    Creatinine, Ser 1.78 (*)    Calcium 8.8 (*)    GFR calc non Af Amer 25 (*)    GFR calc Af Amer 29 (*)    All other components within normal limits  URINALYSIS COMPLETEWITH  MICROSCOPIC ([facility] ONLY) - Abnormal; Notable for the following:    Color, Urine YELLOW (*)    APPearance CLOUDY (*)    Glucose, UA >500 (*)    Ketones, ur 1+ (*)    Hgb urine dipstick 1+ (*)    Protein, ur 30 (*)    Leukocytes, UA 3+ (*)    Squamous Epithelial / LPF 0-5 (*)    All other components within normal limits  GLUCOSE, CAPILLARY - Abnormal; Notable for the following:    Glucose-Capillary 470 (*)    All other components within normal limits  GLUCOSE, CAPILLARY - Abnormal; Notable for the following:    Glucose-Capillary 425 (*)    All other components within normal limits  URINE CULTURE  CBG MONITORING, ED  CBG MONITORING, ED  CBG MONITORING, ED  CBG MONITORING, ED   ____________________________________________  EKG  Reviewed and interpreted by me at 2330 Heart rate 65 QRS 80 QTc 480 Normal sinus rhythm, mild prolongation of QT interval, and no evidence of acute ischemic abnormality noted. ____________________________________________  RADIOLOGY  Ct Renal Stone Study  Result Date: 02/08/2016 CLINICAL DATA:  82 year old female with recent fall with worsening right lower back pain. EXAM: CT ABDOMEN AND PELVIS WITHOUT CONTRAST TECHNIQUE: Multidetector CT imaging of the abdomen and pelvis was performed following  the standard protocol without IV contrast. COMPARISON:  CT dated 04/30/2015 and radiograph dated 10/26/2015 FINDINGS: Evaluation of this exam is limited in the absence of intravenous contrast. Minimal bibasilar atelectatic changes of the lungs with bibasilar interstitial prominence, likely mild interstitial edema. No focal consolidation. There is coronary vascular calcification and calcification of the mitral annulus. There is hypoattenuation of the cardiac blood pool suggestive of a degree of anemia. Clinical correlation is recommended. No intra-abdominal free air or free fluid. A 1 cm right hepatic hypodense lesion is not characterized but may represent cysts or hemangioma. The liver is otherwise unremarkable. The gallbladder, pancreas, spleen, and the adrenal glands appear unremarkable with bilateral renal vascular calcification noted. There is no hydronephrosis or nephrolithiasis on either side. The visualized ureters and urinary bladder appear unremarkable with small pockets of gas within the urinary bladder likely iatrogenic. Correlation with history of recent instrumentation recommended. The uterus and ovaries are grossly unremarkable. Evaluation of the bowel is limited in the absence of oral contrast. There is no evidence of bowel obstruction or active inflammation. The appendix is not visualized with certainty. No inflammatory changes identified in the right lower quadrant. There is advanced aortoiliac atherosclerotic disease. There is calcification of splenic artery as well as mesentery vasculature. Evaluation of the aorta and vasculature is limited on this noncontrast study. No portal venous gas identified. There is no adenopathy. The abdominal wall soft tissues appear unremarkable with no fluid collection or hematoma. There is advanced osteopenia with degenerative changes of the spine. Multiple old right posterior rib fractures. Chronic appearing L1 and L4 compression fractures of the posterior corner of  the superior endplates measuring approximately 7 mm at the level of L1. There is associated mild to moderate focal narrowing of the central canal similar to prior study. No definite acute fracture. IMPRESSION: No acute/traumatic intra-abdominal pelvic pathology. Advanced osteopenia with degenerative changes of the spine. L1 and L4 compression deformities as seen on the prior study with slight retropulsion of the posterior corner of the superior endplates and mild focal central canal narrowing. No definite acute fracture. Electronically Signed   By: Elgie Collard M.D.   On: 02/08/2016 23:27    ____________________________________________  PROCEDURES  Procedure(s) performed: None  Procedures  Critical Care performed: No  ____________________________________________   INITIAL IMPRESSION / ASSESSMENT AND PLAN / ED COURSE  Pertinent labs & imaging results that were available during my care of the patient were reviewed by me and considered in my medical decision making (see chart for details).  Patient presents for slight agitation, hyperglycemia, and discomfort in her right lower back. Mild CVA tenderness, and urinalysis with multiple leukocytes and white cells. Concerning for probable recurrent UTI or possible pyelonephritis.  No evidence of acute neurologic deficit. No cardiac or pulmonary symptoms. Discussed with the family controlling her blood sugar, hydrating, and outpatient antibiotics versus observation as she is advanced in age, and her glucose was quite elevated she does appear to have mild dehydration. After discussion with the family including daughter, shared medical decision making we will admit the patient for observation and further treatment to control her glucose, hydrate her, and treat her urinary tract infection.  Clinical Course     ____________________________________________   FINAL CLINICAL IMPRESSION(S) / ED DIAGNOSES  Final diagnoses:  Acute urinary tract  infection  Dehydration  Agitation  Hyperglycemia      NEW MEDICATIONS STARTED DURING THIS VISIT:  New Prescriptions   No medications on file     Note:  This document was prepared using Dragon voice recognition software and may include unintentional dictation errors.     Sharyn Creamer, MD 02/08/16 [id number]

## 2016-02-08 NOTE — ED Notes (Signed)
Pt stated she needed to urinate, was placed on bedpan and cleaned. Virl Diamond was replaced under pt. With all the movement, pt's IV was torn out. Pressure held to site.

## 2016-02-08 NOTE — ED Notes (Signed)
Pt was given Tylenol at facility before coming to ED.

## 2016-02-08 NOTE — ED Notes (Signed)
Pt transported to CT via stretcher.  

## 2016-02-08 NOTE — ED Notes (Signed)
Pt states lower back pain that began yesterday. Daughter states she fell on Saturday night, was seen here, states bruise on L lower back, pt c/o of R lower back pain.   Family informed about glucose on blood work being high.   Pt knees elevated for comfort, warm blankets covering her. Pillow behind head.

## 2016-02-08 NOTE — ED Notes (Signed)
Pt returned from CT via stretcher.

## 2016-02-09 ENCOUNTER — Encounter: Payer: Self-pay | Admitting: Internal Medicine

## 2016-02-09 DIAGNOSIS — E119 Type 2 diabetes mellitus without complications: Secondary | ICD-10-CM

## 2016-02-09 DIAGNOSIS — N189 Chronic kidney disease, unspecified: Secondary | ICD-10-CM

## 2016-02-09 DIAGNOSIS — N390 Urinary tract infection, site not specified: Secondary | ICD-10-CM | POA: Diagnosis present

## 2016-02-09 DIAGNOSIS — E039 Hypothyroidism, unspecified: Secondary | ICD-10-CM | POA: Diagnosis present

## 2016-02-09 DIAGNOSIS — N179 Acute kidney failure, unspecified: Secondary | ICD-10-CM | POA: Diagnosis present

## 2016-02-09 DIAGNOSIS — F329 Major depressive disorder, single episode, unspecified: Secondary | ICD-10-CM | POA: Diagnosis present

## 2016-02-09 DIAGNOSIS — I10 Essential (primary) hypertension: Secondary | ICD-10-CM | POA: Diagnosis present

## 2016-02-09 DIAGNOSIS — F32A Depression, unspecified: Secondary | ICD-10-CM | POA: Diagnosis present

## 2016-02-09 DIAGNOSIS — K219 Gastro-esophageal reflux disease without esophagitis: Secondary | ICD-10-CM | POA: Diagnosis present

## 2016-02-09 LAB — BASIC METABOLIC PANEL
ANION GAP: 5 (ref 5–15)
BUN: 37 mg/dL — ABNORMAL HIGH (ref 6–20)
CHLORIDE: 105 mmol/L (ref 101–111)
CO2: 25 mmol/L (ref 22–32)
CREATININE: 1.34 mg/dL — AB (ref 0.44–1.00)
Calcium: 8.3 mg/dL — ABNORMAL LOW (ref 8.9–10.3)
GFR calc non Af Amer: 35 mL/min — ABNORMAL LOW (ref >60)
GFR, EST AFRICAN AMERICAN: 40 mL/min — AB (ref >60)
Glucose, Bld: 116 mg/dL — ABNORMAL HIGH (ref 65–99)
POTASSIUM: 4.1 mmol/L (ref 3.5–5.1)
SODIUM: 135 mmol/L (ref 135–145)

## 2016-02-09 LAB — CBC
HEMATOCRIT: 28 % — AB (ref 35.0–47.0)
HEMOGLOBIN: 9.6 g/dL — AB (ref 12.0–16.0)
MCH: 32.4 pg (ref 26.0–34.0)
MCHC: 34.2 g/dL (ref 32.0–36.0)
MCV: 94.5 fL (ref 80.0–100.0)
PLATELETS: 137 10*3/uL — AB (ref 150–440)
RBC: 2.96 MIL/uL — AB (ref 3.80–5.20)
RDW: 15.4 % — ABNORMAL HIGH (ref 11.5–14.5)
WBC: 7.2 10*3/uL (ref 3.6–11.0)

## 2016-02-09 LAB — GLUCOSE, CAPILLARY
GLUCOSE-CAPILLARY: 235 mg/dL — AB (ref 65–99)
GLUCOSE-CAPILLARY: 255 mg/dL — AB (ref 65–99)
Glucose-Capillary: 227 mg/dL — ABNORMAL HIGH (ref 65–99)
Glucose-Capillary: 229 mg/dL — ABNORMAL HIGH (ref 65–99)
Glucose-Capillary: 233 mg/dL — ABNORMAL HIGH (ref 65–99)

## 2016-02-09 LAB — HEMOGLOBIN A1C: Hgb A1c MFr Bld: 8.3 % — ABNORMAL HIGH (ref 4.0–6.0)

## 2016-02-09 MED ORDER — AMLODIPINE BESYLATE 5 MG PO TABS
2.5000 mg | ORAL_TABLET | Freq: Every day | ORAL | Status: DC
  Administered 2016-02-09 – 2016-02-10 (×2): 2.5 mg via ORAL
  Filled 2016-02-09 (×2): qty 1

## 2016-02-09 MED ORDER — DEXTROSE 5 % IV SOLN
1.0000 g | INTRAVENOUS | Status: DC
  Administered 2016-02-09: 1 g via INTRAVENOUS
  Filled 2016-02-09 (×2): qty 10

## 2016-02-09 MED ORDER — PANTOPRAZOLE SODIUM 40 MG PO TBEC
40.0000 mg | DELAYED_RELEASE_TABLET | Freq: Every day | ORAL | Status: DC
  Administered 2016-02-09 – 2016-02-10 (×2): 40 mg via ORAL
  Filled 2016-02-09 (×2): qty 1

## 2016-02-09 MED ORDER — ONDANSETRON HCL 4 MG/2ML IJ SOLN: 4.0000 mg | Freq: Four times a day (QID) | INTRAMUSCULAR | Status: DC | PRN

## 2016-02-09 MED ORDER — INSULIN ASPART 100 UNIT/ML ~~LOC~~ SOLN
0.0000 [IU] | Freq: Three times a day (TID) | SUBCUTANEOUS | Status: DC
  Administered 2016-02-09: 3 [IU] via SUBCUTANEOUS
  Administered 2016-02-09 (×2): 5 [IU] via SUBCUTANEOUS
  Administered 2016-02-09: 3 [IU] via SUBCUTANEOUS
  Administered 2016-02-10: 1 [IU] via SUBCUTANEOUS
  Filled 2016-02-09 (×3): qty 5
  Filled 2016-02-09: qty 3
  Filled 2016-02-09: qty 1

## 2016-02-09 MED ORDER — INSULIN GLARGINE 100 UNIT/ML ~~LOC~~ SOLN
10.0000 [IU] | Freq: Every day | SUBCUTANEOUS | Status: DC
  Administered 2016-02-09: 10 [IU] via SUBCUTANEOUS
  Filled 2016-02-09 (×3): qty 0.1

## 2016-02-09 MED ORDER — HYDRALAZINE HCL 50 MG PO TABS
50.0000 mg | ORAL_TABLET | Freq: Two times a day (BID) | ORAL | Status: DC
  Administered 2016-02-09 – 2016-02-10 (×3): 50 mg via ORAL
  Filled 2016-02-09 (×4): qty 1

## 2016-02-09 MED ORDER — ACETAMINOPHEN 650 MG RE SUPP: 650.0000 mg | Freq: Four times a day (QID) | RECTAL | Status: DC | PRN

## 2016-02-09 MED ORDER — LEVOTHYROXINE SODIUM 50 MCG PO TABS
50.0000 ug | ORAL_TABLET | Freq: Every day | ORAL | Status: DC
  Administered 2016-02-09 – 2016-02-10 (×2): 50 ug via ORAL
  Filled 2016-02-09 (×2): qty 1

## 2016-02-09 MED ORDER — HEPARIN SODIUM (PORCINE) 5000 UNIT/ML IJ SOLN
5000.0000 [IU] | Freq: Three times a day (TID) | INTRAMUSCULAR | Status: DC
  Administered 2016-02-09 (×3): 5000 [IU] via SUBCUTANEOUS
  Filled 2016-02-09 (×3): qty 1

## 2016-02-09 MED ORDER — DONEPEZIL HCL 5 MG PO TABS
5.0000 mg | ORAL_TABLET | Freq: Every day | ORAL | Status: DC
  Administered 2016-02-09 (×2): 5 mg via ORAL
  Filled 2016-02-09 (×2): qty 1

## 2016-02-09 MED ORDER — ONDANSETRON HCL 4 MG PO TABS: 4.0000 mg | ORAL_TABLET | Freq: Four times a day (QID) | ORAL | Status: DC | PRN

## 2016-02-09 MED ORDER — QUETIAPINE FUMARATE 25 MG PO TABS: 150.0000 mg | ORAL_TABLET | Freq: Every day | ORAL | Status: DC

## 2016-02-09 MED ORDER — ACETAMINOPHEN 325 MG PO TABS: 650.0000 mg | ORAL_TABLET | Freq: Four times a day (QID) | ORAL | Status: DC | PRN

## 2016-02-09 MED ORDER — SERTRALINE HCL 100 MG PO TABS
150.0000 mg | ORAL_TABLET | Freq: Every day | ORAL | Status: DC
  Administered 2016-02-09 – 2016-02-10 (×2): 150 mg via ORAL
  Filled 2016-02-09 (×2): qty 1

## 2016-02-09 MED ORDER — SODIUM CHLORIDE 0.9 % IV SOLN
INTRAVENOUS | Status: AC
  Administered 2016-02-09: 02:00:00 via INTRAVENOUS

## 2016-02-09 NOTE — Progress Notes (Signed)
Pharmacy Antibiotic Note  Julie Burch is a 82 y.o. female admitted on 02/08/2016 with UTI.  Pharmacy has been consulted for ceftriaxone dosing.  Plan: Ceftriaxone 1 gm IV Q24H  Weight: 113 lb (51.3 kg)  Temp (24hrs), Avg:98.3 F (36.8 C), Min:97.8 F (36.6 C), Max:98.8 F (37.1 C)   Recent Labs Lab 02/08/16 1925  WBC 11.6*  CREATININE 1.78*    Estimated Creatinine Clearance: 16.3 mL/min (by C-G formula based on SCr of 1.78 mg/dL).    Allergies  Allergen Reactions  . Aspirin Other (See Comments)    Reaction:  Intracranial hemorrhage   . Ciprofloxacin Other (See Comments)    Reaction:  Hallucinations/severe agitation    . Diovan [Valsartan] Other (See Comments)    Reaction:  High potassium levels   . Fosamax [Alendronate Sodium] Other (See Comments)    Reaction:  GI upset   . Sulfa Antibiotics Hives    Thank you for allowing pharmacy to be a part of this patient's care.  Carola Frost, Pharm.D., BCPS Clinical Pharmacist 02/09/2016 1:08 AM

## 2016-02-09 NOTE — Progress Notes (Signed)
Patient has been incontinent most of shift. Daughter states that she can stand and pivot from wheelchair. 2 person max assist.

## 2016-02-09 NOTE — Clinical Social Work Note (Signed)
Clinical Social Work Assessment  Patient Details  Name: Julie Burch MRN: [id number] Date of Birth: Jan 20, 1927  Date of referral:  02/09/16               Reason for consult:  Other (Comment Required) (From [facility] )                Permission sought to share information with:  Chartered certified accountant granted to share information::  Yes, Verbal Permission Granted  Name::      [facility]   Agency::     Relationship::     Contact Information:     Housing/Transportation Living arrangements for the past 2 months:  [place] of Information:  Adult Children, Facility Patient Interpreter Needed:  None Criminal Activity/Legal Involvement Pertinent to Current Situation/Hospitalization:  No - Comment as needed Significant Relationships:  Adult Children Lives with:  Facility Resident Do you feel safe going back to the place where you live?  Yes Need for family participation in patient care:  Yes (Comment)  Care giving concerns:  Patient is a long term care resident at [facility] (fax: [phone])    Social Worker assessment / plan:  Clinical Social Worker (CSW) received verbal consult from RN Case Manager that patient is from [facility]. CSW contacted [facility] to get additional information. Per Med Tech patient is a private pay resident and has been there since 03/20/14. Per Med Tech patient is on room air, wheel chair bound and is on the regular ALF side. Per Med Tech patient does not have home health and can return as long as she does not have an extensive hospital stay.  CSW met with patient and her daughter Julie Burch [phone]). Per daughter patient does not walk however she can pivot with assistance. Per daughter patient has tried PT and it did not help. Daughter is agreeable for patient to return to [facility] and reported that she can transport.    Employment status:  Retired Forensic scientist:   Medicare PT Recommendations:  Not assessed at this time [place] / Referral to community resources:  Other (Comment Required) (Patient will return to ALF )  Patient/Family's Response to care:  Patient's daughter is agreeable for patient to return to [facility].   Patient/Family's Understanding of and Emotional Response to Diagnosis, Current Treatment, and Prognosis: Patient's daughter understands that patient is under observation and will likely D/C in 24-48 hours. Daughter was very pleasant and reported that she is use to taking care of patient and this is nothing new.   Emotional Assessment Appearance:  Appears stated age Attitude/Demeanor/Rapport:  Unable to Assess Affect (typically observed):  Unable to Assess Orientation:  Oriented to Self, Oriented to Place, Oriented to  Time, Oriented to Situation Alcohol / Substance use:  Not Applicable Psych involvement (Current and /or in the community):  No (Comment)  Discharge Needs  Concerns to be addressed:  Discharge Planning Concerns Readmission within the last 30 days:  No Current discharge risk:  Chronically ill, Dependent with Mobility Barriers to Discharge:  Continued Medical Work up   [employer], Veronia Beets, LCSW 02/09/2016, 10:51 AM

## 2016-02-09 NOTE — Care Management Obs Status (Signed)
MEDICARE OBSERVATION STATUS NOTIFICATION   Patient Details  Name: Julie Burch MRN: [id number] Date of Birth: [birth year]   Medicare Observation Status Notification Given:  Yes    Adonis Huguenin, RN 02/09/2016, 8:55 AM

## 2016-02-09 NOTE — Progress Notes (Signed)
[employer] [facility] [facility] [place] [facility]   PATIENT NAME: Julie Burch    MR#:  [id number]  DATE OF BIRTH:  01-Jul-1927  SUBJECTIVE:   Patient here due to worsening mental status, right flank pain and noted to have a urinary tract infection. Family at bedside mental status much improved, no longer having any abdominal or flank pain.  REVIEW OF SYSTEMS:    Review of Systems  Constitutional: Negative for chills and fever.  HENT: Negative for congestion and tinnitus.   Eyes: Negative for blurred vision and double vision.  Respiratory: Negative for cough, shortness of breath and wheezing.   Cardiovascular: Negative for chest pain, orthopnea and PND.  Gastrointestinal: Negative for abdominal pain, diarrhea, nausea and vomiting.  Genitourinary: Negative for dysuria and hematuria.  Neurological: Negative for dizziness, sensory change and focal weakness.  All other systems reviewed and are negative.   Nutrition: Heart Healthy/Carb control Tolerating Diet: Yes Tolerating PT: Ambulatory.    DRUG ALLERGIES:   Allergies  Allergen Reactions  . Aspirin Other (See Comments)    Reaction:  Intracranial hemorrhage   . Ciprofloxacin Other (See Comments)    Reaction:  Hallucinations/severe agitation    . Diovan [Valsartan] Other (See Comments)    Reaction:  High potassium levels   . Fosamax [Alendronate Sodium] Other (See Comments)    Reaction:  GI upset   . Sulfa Antibiotics Hives    VITALS:  Blood pressure (!) 89/69, pulse 78, temperature 98.9 F (37.2 C), temperature source Oral, resp. rate 16, height 5' (1.524 m), weight 57.4 kg (126 lb 9.6 oz), SpO2 95 %.  PHYSICAL EXAMINATION:   Physical Exam  GENERAL:  82 y.o.-year-old patient lying in the bed with no acute distress.  EYES: Pupils equal, round, reactive to light and accommodation. No scleral icterus. Extraocular muscles intact.  HEENT: Head atraumatic, normocephalic. Oropharynx and nasopharynx clear.  NECK:   Supple, no jugular venous distention. No thyroid enlargement, no tenderness.  LUNGS: Normal breath sounds bilaterally, no wheezing, rales, rhonchi. No use of accessory muscles of respiration.  CARDIOVASCULAR: S1, S2 normal. II/VI SEM at LSB, No rubs, or gallops.  ABDOMEN: Soft, nontender, nondistended. Bowel sounds present. No organomegaly or mass.  EXTREMITIES: No cyanosis, clubbing or edema b/l.    NEUROLOGIC: Cranial nerves II through XII are intact. No focal Motor or sensory deficits b/l.  Globally weak. PSYCHIATRIC: The patient is alert and oriented x 1.  SKIN: No obvious rash, lesion, or ulcer.    LABORATORY PANEL:   CBC  Recent Labs Lab 02/09/16 0449  WBC 7.2  HGB 9.6*  HCT 28.0*  PLT 137*   ------------------------------------------------------------------------------------------------------------------  Chemistries   Recent Labs Lab 02/09/16 0449  NA 135  K 4.1  CL 105  CO2 25  GLUCOSE 116*  BUN 37*  CREATININE 1.34*  CALCIUM 8.3*   ------------------------------------------------------------------------------------------------------------------  Cardiac Enzymes No results for input(s): TROPONINI in the last 168 hours. ------------------------------------------------------------------------------------------------------------------  RADIOLOGY:  Ct Renal Stone Study  Result Date: 02/08/2016 CLINICAL DATA:  82 year old female with recent fall with worsening right lower back pain. EXAM: CT ABDOMEN AND PELVIS WITHOUT CONTRAST TECHNIQUE: Multidetector CT imaging of the abdomen and pelvis was performed following the standard protocol without IV contrast. COMPARISON:  CT dated 04/30/2015 and radiograph dated 10/26/2015 FINDINGS: Evaluation of this exam is limited in the absence of intravenous contrast. Minimal bibasilar atelectatic changes of the lungs with bibasilar interstitial prominence, likely mild interstitial edema. No focal consolidation. There is coronary  vascular calcification  and calcification of the mitral annulus. There is hypoattenuation of the cardiac blood pool suggestive of a degree of anemia. Clinical correlation is recommended. No intra-abdominal free air or free fluid. A 1 cm right hepatic hypodense lesion is not characterized but may represent cysts or hemangioma. The liver is otherwise unremarkable. The gallbladder, pancreas, spleen, and the adrenal glands appear unremarkable with bilateral renal vascular calcification noted. There is no hydronephrosis or nephrolithiasis on either side. The visualized ureters and urinary bladder appear unremarkable with small pockets of gas within the urinary bladder likely iatrogenic. Correlation with history of recent instrumentation recommended. The uterus and ovaries are grossly unremarkable. Evaluation of the bowel is limited in the absence of oral contrast. There is no evidence of bowel obstruction or active inflammation. The appendix is not visualized with certainty. No inflammatory changes identified in the right lower quadrant. There is advanced aortoiliac atherosclerotic disease. There is calcification of splenic artery as well as mesentery vasculature. Evaluation of the aorta and vasculature is limited on this noncontrast study. No portal venous gas identified. There is no adenopathy. The abdominal wall soft tissues appear unremarkable with no fluid collection or hematoma. There is advanced osteopenia with degenerative changes of the spine. Multiple old right posterior rib fractures. Chronic appearing L1 and L4 compression fractures of the posterior corner of the superior endplates measuring approximately 7 mm at the level of L1. There is associated mild to moderate focal narrowing of the central canal similar to prior study. No definite acute fracture. IMPRESSION: No acute/traumatic intra-abdominal pelvic pathology. Advanced osteopenia with degenerative changes of the spine. L1 and L4 compression deformities  as seen on the prior study with slight retropulsion of the posterior corner of the superior endplates and mild focal central canal narrowing. No definite acute fracture. Electronically Signed   By: Elgie Collard M.D.   On: 02/08/2016 23:27     ASSESSMENT AND PLAN:   82 year old female with history of previous CVA, dementia, hypertension, GERD, diabetes, history of recurrent UTIs who presented to the hospital due to altered mental status abdominal pain and noted to have urinary tract infection.  1. Urinary tract infection-patient has had a previous history of Escherichia coli UTI. Continue IV ceftriaxone, follow urine cultures. Currently afebrile, white cell count improved.  2. Acute kidney injury-secondary to dehydration. Improved with IV fluids. Will monitor.  3. Dementia-continue Aricept.  4. Essential hypertension-continue Norvasc, hydralazine  5. Hypothyroidism-continue Synthroid.  6. Diabetes type 2 without compensation-continue Lantus, insulin sliding scale.  7. GERD-continue Protonix.  8. Depression - cont. Seroquel, Zoloft.       All the records are reviewed and case discussed with Care Management/Social Workerr. Management plans discussed with the patient, family and they are in agreement.  CODE STATUS: Full  DVT Prophylaxis: Hep. SQ  TOTAL TIME TAKING CARE OF THIS PATIENT: 30 minutes.   POSSIBLE D/C IN 1-2 DAYS, DEPENDING ON CLINICAL CONDITION.   Houston Siren M.D on 02/09/2016 at 2:54 PM  Between 7am to 6pm - Pager - [phone]  After 6pm go to [email or web address] - password [email or web address] [facility]  [place]  [facility]  Office  [phone]  CC: Primary care physician; Mariam Dollar, MD

## 2016-02-09 NOTE — Progress Notes (Signed)
Noted one loose BM this am, no other documented BMs at this time

## 2016-02-09 NOTE — [place] FL2 (Signed)
[place] MEDICAID FL2 LEVEL OF CARE SCREENING TOOL     IDENTIFICATION  Patient Name: Julie Burch Birthdate: [birth year] Sex: female Admission Date (Current Location): 02/08/2016  [facility] and [place] Number:  Chiropodist and Address:  [facility], [address], [place], [place] [address]      Provider Number: [id number]  Attending Physician Name and Address:  Houston Siren, MD  Relative Name and Phone Number:       Current Level of Care: Hospital Recommended Level of Care: Assisted Living Facility Prior Approval Number:    Date Approved/Denied:   PASRR Number:  [id number])  Discharge Plan: Domiciliary (Rest home)    Current Diagnoses: Patient Active Problem List   Diagnosis Date Noted  . Recurrent UTI 02/09/2016  . GERD (gastroesophageal reflux disease) 02/09/2016  . HTN (hypertension) 02/09/2016  . Hypothyroidism 02/09/2016  . Depression 02/09/2016  . Acute on chronic kidney failure (HCC) 02/09/2016  . Diabetes (HCC) 02/09/2016    Orientation RESPIRATION BLADDER Height & Weight     Self, Time, Situation, Place  Normal Incontinent Weight: 126 lb 9.6 oz (57.4 kg) Height:  5' (152.4 cm)  BEHAVIORAL SYMPTOMS/MOOD NEUROLOGICAL BOWEL NUTRITION STATUS   (none )  (none ) Continent Diet (Diet: Heart Healthy/ Carb Modified )  AMBULATORY STATUS COMMUNICATION OF NEEDS Skin   Extensive Assist (Wheel Chair Bound ) Verbally Normal                       Personal Care Assistance Level of Assistance  Bathing, Feeding, Dressing Bathing Assistance: Limited assistance Feeding assistance: Independent Dressing Assistance: Limited assistance     Functional Limitations Info  Sight, Hearing, Speech Sight Info: Adequate Hearing Info: Impaired Speech Info: Adequate    SPECIAL CARE FACTORS FREQUENCY                       Contractures      Additional Factors Info  Code Status, Allergies Code Status Info:  (Full Code.  ) Allergies Info:  (Aspirin, Ciprofloxacin, Diovan Valsartan, Fosamax Alendronate Sodium, Sulfa Antibiotics)           Current Medications (02/09/2016):  This is the current hospital active medication list Current Facility-Administered Medications  Medication Dose Route Frequency Provider Last Rate Last Dose  . 0.9 %  sodium chloride infusion   Intravenous Continuous Oralia Manis, MD 75 mL/hr at 02/09/16 0141    . acetaminophen (TYLENOL) tablet 650 mg  650 mg Oral Q6H PRN Oralia Manis, MD       Or  . acetaminophen (TYLENOL) suppository 650 mg  650 mg Rectal Q6H PRN Oralia Manis, MD      . amLODipine (NORVASC) tablet 2.5 mg  2.5 mg Oral Daily Oralia Manis, MD      . cefTRIAXone (ROCEPHIN) 1 g in dextrose 5 % 50 mL IVPB  1 g Intravenous Q24H Oralia Manis, MD      . donepezil (ARICEPT) tablet 5 mg  5 mg Oral QHS Oralia Manis, MD   5 mg at 02/09/16 0156  . heparin injection 5,000 Units  5,000 Units Subcutaneous Q8H Oralia Manis, MD   5,000 Units at 02/09/16 0825  . hydrALAZINE (APRESOLINE) tablet 50 mg  50 mg Oral BID Oralia Manis, MD      . insulin aspart (novoLOG) injection 0-9 Units  0-9 Units Subcutaneous TID [facility] Oralia Manis, MD   5 Units at 02/09/16 [phone]  . insulin glargine (  LANTUS) injection 10 Units  10 Units Subcutaneous QHS Oralia Manis, MD      . levothyroxine (SYNTHROID, LEVOTHROID) tablet 50 mcg  50 mcg Oral QAC breakfast Oralia Manis, MD   50 mcg at 02/09/16 [phone]  . ondansetron (ZOFRAN) tablet 4 mg  4 mg Oral Q6H PRN Oralia Manis, MD       Or  . ondansetron [facility]) injection 4 mg  4 mg Intravenous Q6H PRN Oralia Manis, MD      . pantoprazole (PROTONIX) EC tablet 40 mg  40 mg Oral QAC breakfast Oralia Manis, MD   40 mg at 02/09/16 0827  . [START ON 02/10/2016] QUEtiapine (SEROQUEL) tablet 150 mg  150 mg Oral QHS Oralia Manis, MD      . sertraline (ZOLOFT) tablet 150 mg  150 mg Oral Daily Oralia Manis, MD   150 mg at 02/09/16 0825     Discharge Medications: Please see  discharge summary for a list of discharge medications.  Relevant Imaging Results:  Relevant Lab Results:   Additional Information  ([id number])  Khizar Fiorella, Darleen Crocker, LCSW

## 2016-02-09 NOTE — ED Notes (Signed)
Pt transported to Room 149

## 2016-02-09 NOTE — Care Management Note (Signed)
Case Management Note  Patient Details  Name: Julie Burch MRN: [id number] Date of Birth: [birth year]  Subjective/Objective:   Spoke with patient and daughterDois Burch) at the bedside. Patient is from [facility] living , she is wheelchair bound per daughter.    Patient gets her medications filled at [facility]. PCP is Julie Burch.   Notified CSW Julie Burch of patient being from assisted living facility. Daughter Julie Burch is POA and resides in [place]. Discussed Observation status and Medicare rules concerning this including 3 night stay rule for SNF. Daughter given my contact information.   Action/Plan: Return to assisted living at time of discharge.   Expected Discharge Date:                  Expected Discharge Plan:  Assisted Living / Rest Home  In-House Referral:     Discharge planning Services  CM Consult  Post Acute Care Choice:  NA Choice offered to:  Julie Burch POA / Guardian  DME Arranged:  N/A DME Agency:  NA  HH Arranged:    HH Agency:     Status of Service:  In process, will continue to follow  If discussed at Long Length of Stay Meetings, dates discussed:    Additional Comments:  Julie Huguenin, RN 02/09/2016, 8:56 AM

## 2016-02-09 NOTE — H&P (Signed)
[facility] [place] [facility]   PATIENT NAME: Julie Burch    MR#:  [id number]  DATE OF BIRTH:  1926-12-01  DATE OF ADMISSION:  02/08/2016  PRIMARY CARE PHYSICIAN: Mariam Dollar, MD   REQUESTING/REFERRING PHYSICIAN: Fanny Bien, MD  CHIEF COMPLAINT:   Chief Complaint  Patient presents with  . Back Pain    HISTORY OF PRESENT ILLNESS:  Julie Burch  is a 82 y.o. female who presents with A couple days of increased irritability and dysuria with flank pain. Here in the ED she was found to have likely UTI. CT study for renal stones did not show any nephrolithiasis, and did not show pyelonephritis. However, patient does have complaint of flank pain along with her UTI. She does not have an elevated white blood cell count or systemic symptoms of infection. IV antibiotics were started and hospitalists were called for admission.  PAST MEDICAL HISTORY:   Past Medical History:  Diagnosis Date  . Depression   . Diabetes mellitus without complication (HCC)   . GERD (gastroesophageal reflux disease)   . Hypertension   . Recurrent UTI   . Stroke [facility])    TIA    PAST SURGICAL HISTORY:   Past Surgical History:  Procedure Laterality Date  . TONSILLECTOMY      SOCIAL HISTORY:   Social History  Substance Use Topics  . Smoking status: Former Games developer  . Smokeless tobacco: Never Used  . Alcohol use No    FAMILY HISTORY:  No family history on file.  DRUG ALLERGIES:   Allergies  Allergen Reactions  . Aspirin Other (See Comments)    Reaction:  Intracranial hemorrhage   . Ciprofloxacin Other (See Comments)    Reaction:  Hallucinations/severe agitation    . Diovan [Valsartan] Other (See Comments)    Reaction:  High potassium levels   . Fosamax [Alendronate Sodium] Other (See Comments)    Reaction:  GI upset   . Sulfa Antibiotics Hives    MEDICATIONS AT HOME:   Prior to Admission medications   Medication Sig Start Date End Date Taking? Authorizing  Provider  acetaminophen (TYLENOL) 500 MG tablet Take 1,000 mg by mouth every 4 (four) hours as needed for mild pain or headache.   Yes Historical Provider, MD  amLODipine (NORVASC) 2.5 MG tablet Take 2.5 mg by mouth daily.   Yes Historical Provider, MD  cholecalciferol (VITAMIN D) 1000 units tablet Take 2,000 Units by mouth daily.   Yes Historical Provider, MD  Coenzyme Q10 (COQ-10) 100 MG CAPS Take 100 mg by mouth daily.   Yes Historical Provider, MD  donepezil (ARICEPT) 5 MG tablet Take 5 mg by mouth at bedtime.   Yes Historical Provider, MD  esomeprazole (NEXIUM) 40 MG capsule Take 40 mg by mouth daily before breakfast.   Yes Historical Provider, MD  fosfomycin (MONUROL) 3 g PACK Take 3 g by mouth See admin instructions. Pt is to take once every ten days.   Yes Historical Provider, MD  glucose 4 GM chewable tablet Chew 4 tablets by mouth as needed (for blood sugar less than 80).   Yes Historical Provider, MD  hydrALAZINE (APRESOLINE) 50 MG tablet Take 50 mg by mouth 2 (two) times daily.   Yes Historical Provider, MD  insulin glargine (LANTUS) 100 UNIT/ML injection Inject 17 Units into the skin at bedtime.    Yes Historical Provider, MD  insulin lispro (HUMALOG) 100 UNIT/ML injection Inject 4-5 Units into the skin 3 (three) times daily. Pt  uses 5 units after breakfast, 5 units before lunch, and 4 units before dinner.   Yes Historical Provider, MD  levothyroxine (SYNTHROID, LEVOTHROID) 50 MCG tablet Take 50 mcg by mouth daily before breakfast.   Yes Historical Provider, MD  losartan (COZAAR) 25 MG tablet Take 25 mg by mouth daily.   Yes Historical Provider, MD  ondansetron (ZOFRAN) 4 MG tablet Take 4 mg by mouth every 8 (eight) hours as needed for nausea or vomiting.   Yes Historical Provider, MD  QUEtiapine (SEROQUEL) 50 MG tablet Take 150 mg by mouth at bedtime.    Yes Historical Provider, MD  sertraline (ZOLOFT) 100 MG tablet Take 150 mg by mouth daily.   Yes Historical Provider, MD  vitamin  B-12 (CYANOCOBALAMIN) 1000 MCG tablet Take 1,000 mcg by mouth daily.   Yes Historical Provider, MD    REVIEW OF SYSTEMS:  Review of Systems  Constitutional: Negative for chills, fever, malaise/fatigue and weight loss.  HENT: Negative for ear pain, hearing loss and tinnitus.   Eyes: Negative for blurred vision, double vision, pain and redness.  Respiratory: Negative for cough, hemoptysis and shortness of breath.   Cardiovascular: Negative for chest pain, palpitations, orthopnea and leg swelling.  Gastrointestinal: Negative for abdominal pain, constipation, diarrhea, nausea and vomiting.  Genitourinary: Positive for dysuria and flank pain. Negative for frequency and hematuria.  Musculoskeletal: Negative for back pain, joint pain and neck pain.  Skin:       No acne, rash, or lesions  Neurological: Negative for dizziness, tremors, focal weakness and weakness.  Endo/Heme/Allergies: Negative for polydipsia. Does not bruise/bleed easily.  Psychiatric/Behavioral: Negative for depression. The patient is not nervous/anxious and does not have insomnia.        Irritability     VITAL SIGNS:   Vitals:   02/08/16 2106 02/08/16 2130 02/08/16 2214 02/08/16 2345  BP: (!) 151/47 (!) 146/53 (!) 136/51   Pulse: 73 72 73   Resp: 16 18 20    Temp:    97.8 F (36.6 C)  TempSrc:    Oral  SpO2: 95% 96% 95%   Weight:       Wt Readings from Last 3 Encounters:  02/08/16 51.3 kg (113 lb)  02/05/16 55.3 kg (122 lb)  07/22/15 56 kg (123 lb 8 oz)    PHYSICAL EXAMINATION:  Physical Exam  Vitals reviewed. Constitutional: She is oriented to person, place, and time. She appears well-developed and well-nourished. No distress.  HENT:  Head: Normocephalic and atraumatic.  Mouth/Throat: Oropharynx is clear and moist.  Eyes: Conjunctivae and EOM are normal. Pupils are equal, round, and reactive to light. No scleral icterus.  Neck: Normal range of motion. Neck supple. No JVD present. No thyromegaly present.   Cardiovascular: Normal rate, regular rhythm and intact distal pulses.  Exam reveals no gallop and no friction rub.   Murmur (2/6 systolic murmur) heard. Respiratory: Effort normal and breath sounds normal. No respiratory distress. She has no wheezes. She has no rales.  GI: Soft. Bowel sounds are normal. She exhibits no distension. There is tenderness (low abdomen).  Musculoskeletal: Normal range of motion. She exhibits no edema.  No arthritis, no gout  Lymphadenopathy:    She has no cervical adenopathy.  Neurological: She is alert and oriented to person, place, and time. No cranial nerve deficit.  No dysarthria, no aphasia  Skin: Skin is warm and dry. No rash noted. No erythema.  Psychiatric: She has a normal mood and affect. Her behavior is normal. Judgment and  thought content normal.    LABORATORY PANEL:   CBC  Recent Labs Lab 02/08/16 1925  WBC 11.6*  HGB 11.5*  HCT 35.1  PLT 191   ------------------------------------------------------------------------------------------------------------------  Chemistries   Recent Labs Lab 02/08/16 1925  NA 130*  K 4.5  CL 95*  CO2 21*  GLUCOSE 539*  BUN 43*  CREATININE 1.78*  CALCIUM 8.8*   ------------------------------------------------------------------------------------------------------------------  Cardiac Enzymes No results for input(s): TROPONINI in the last 168 hours. ------------------------------------------------------------------------------------------------------------------  RADIOLOGY:  Ct Renal Stone Study  Result Date: 02/08/2016 CLINICAL DATA:  82 year old female with recent fall with worsening right lower back pain. EXAM: CT ABDOMEN AND PELVIS WITHOUT CONTRAST TECHNIQUE: Multidetector CT imaging of the abdomen and pelvis was performed following the standard protocol without IV contrast. COMPARISON:  CT dated 04/30/2015 and radiograph dated 10/26/2015 FINDINGS: Evaluation of this exam is limited in the  absence of intravenous contrast. Minimal bibasilar atelectatic changes of the lungs with bibasilar interstitial prominence, likely mild interstitial edema. No focal consolidation. There is coronary vascular calcification and calcification of the mitral annulus. There is hypoattenuation of the cardiac blood pool suggestive of a degree of anemia. Clinical correlation is recommended. No intra-abdominal free air or free fluid. A 1 cm right hepatic hypodense lesion is not characterized but may represent cysts or hemangioma. The liver is otherwise unremarkable. The gallbladder, pancreas, spleen, and the adrenal glands appear unremarkable with bilateral renal vascular calcification noted. There is no hydronephrosis or nephrolithiasis on either side. The visualized ureters and urinary bladder appear unremarkable with small pockets of gas within the urinary bladder likely iatrogenic. Correlation with history of recent instrumentation recommended. The uterus and ovaries are grossly unremarkable. Evaluation of the bowel is limited in the absence of oral contrast. There is no evidence of bowel obstruction or active inflammation. The appendix is not visualized with certainty. No inflammatory changes identified in the right lower quadrant. There is advanced aortoiliac atherosclerotic disease. There is calcification of splenic artery as well as mesentery vasculature. Evaluation of the aorta and vasculature is limited on this noncontrast study. No portal venous gas identified. There is no adenopathy. The abdominal wall soft tissues appear unremarkable with no fluid collection or hematoma. There is advanced osteopenia with degenerative changes of the spine. Multiple old right posterior rib fractures. Chronic appearing L1 and L4 compression fractures of the posterior corner of the superior endplates measuring approximately 7 mm at the level of L1. There is associated mild to moderate focal narrowing of the central canal similar to  prior study. No definite acute fracture. IMPRESSION: No acute/traumatic intra-abdominal pelvic pathology. Advanced osteopenia with degenerative changes of the spine. L1 and L4 compression deformities as seen on the prior study with slight retropulsion of the posterior corner of the superior endplates and mild focal central canal narrowing. No definite acute fracture. Electronically Signed   By: Elgie Collard M.D.   On: 02/08/2016 23:27    EKG:   Orders placed or performed during the hospital encounter of 02/08/16  . ED EKG  . ED EKG    IMPRESSION AND PLAN:  Principal Problem:   Recurrent UTI - IV antibiotics started in the ED, we'll continue these on admission. Urine culture sent. Active Problems:   Acute on chronic kidney failure (HCC) - likely related to UTI and recent poor by mouth intake. Gentle IV hydration and monitor for expected improvement, avoid nephrotoxins   HTN (hypertension) - stable, continue home meds   Depression - continue home meds   Diabetes (  HCC) - sliding scale insulin with corresponding glucose checks and carb modified diet   GERD (gastroesophageal reflux disease) - home dose PPI   Hypothyroidism - home dose thyroid replacement  All the records are reviewed and case discussed with ED provider. Management plans discussed with the patient and/or family.  DVT PROPHYLAXIS: SubQ heparin  GI PROPHYLAXIS: PPI  ADMISSION STATUS: Observation  CODE STATUS: Full Code Status History    This patient does not have a recorded code status. Please follow your organizational policy for patients in this situation.      TOTAL TIME TAKING CARE OF THIS PATIENT: 40 minutes.    Shakisha Abend FIELDING 02/09/2016, 12:43 AM  [facility]  Office  [phone]  CC: Primary care physician; Mariam Dollar, MD

## 2016-02-09 NOTE — Progress Notes (Signed)
Per MD conversation give schedule am meds that were held this am, and recheck in one hour.

## 2016-02-10 DIAGNOSIS — N390 Urinary tract infection, site not specified: Secondary | ICD-10-CM | POA: Diagnosis not present

## 2016-02-10 LAB — GLUCOSE, CAPILLARY: GLUCOSE-CAPILLARY: 142 mg/dL — AB (ref 65–99)

## 2016-02-10 LAB — URINE CULTURE: Special Requests: NORMAL

## 2016-02-10 MED ORDER — CEFUROXIME AXETIL 500 MG PO TABS: 500.0000 mg | ORAL_TABLET | Freq: Two times a day (BID) | ORAL | 0 refills | Status: DC

## 2016-02-10 NOTE — Care Management Note (Signed)
Case Management Note  Patient Details  Name: Julie Burch MRN: [id number] Date of Birth: January 23, 1927  Subjective/Objective:                    Action/Plan: return to [facility] assisted living. CSW following for return placement. Signed off.   Expected Discharge Date:                  Expected Discharge Plan:  Assisted Living / Rest Home  In-House Referral:     Discharge planning Services  CM Consult  Post Acute Care Choice:  NA Choice offered to:  [facility] POA / Guardian  DME Arranged:  N/A DME Agency:  NA  HH Arranged:    HH Agency:     Status of Service:  Completed, signed off  If discussed at Long Length of Stay Meetings, dates discussed:    Additional Comments:  Adonis Huguenin, RN 02/10/2016, 9:37 AM

## 2016-02-10 NOTE — NC FL2 (Signed)
[place] MEDICAID [id number] LEVEL OF CARE SCREENING TOOL     IDENTIFICATION  Patient Name: Julie Burch Birthdate: 06/11/1927 Sex: female Admission Date (Current Location): 02/08/2016  [facility] and [place] Number:  Chiropodist and Address:  [facility], [address] [place], [place] [address]      Provider Number: [id number]  Attending Physician Name and Address:  Houston Siren, MD  Relative Name and Phone Number:       Current Level of Care: Hospital Recommended Level of Care: Assisted Living Facility Prior Approval Number:    Date Approved/Denied:   PASRR Number:  [id number])  Discharge Plan: Domiciliary (Rest home)    Current Diagnoses: Patient Active Problem List   Diagnosis Date Noted  . Recurrent UTI 02/09/2016  . GERD (gastroesophageal reflux disease) 02/09/2016  . HTN (hypertension) 02/09/2016  . Hypothyroidism 02/09/2016  . Depression 02/09/2016  . Acute on chronic kidney failure (HCC) 02/09/2016  . Diabetes (HCC) 02/09/2016    Orientation RESPIRATION BLADDER Height & Weight     Self, Time, Situation, Place  Normal Incontinent Weight: 126 lb 9.6 oz (57.4 kg) Height:  5' (152.4 cm)  BEHAVIORAL SYMPTOMS/MOOD NEUROLOGICAL BOWEL NUTRITION STATUS   (none )  (none ) Continent Diet (Diet: Heart Healthy/ Carb Modified )  AMBULATORY STATUS COMMUNICATION OF NEEDS Skin   Extensive Assist (Wheel Chair Bound ) Verbally Normal                       Personal Care Assistance Level of Assistance  Bathing, Feeding, Dressing Bathing Assistance: Limited assistance Feeding assistance: Independent Dressing Assistance: Limited assistance     Functional Limitations Info  Sight, Hearing, Speech Sight Info: Adequate Hearing Info: Impaired Speech Info: Adequate    SPECIAL CARE FACTORS FREQUENCY                       Contractures      Additional Factors Info  Code Status, Allergies Code Status Info:  (Full Code.  ) Allergies Info:  (Aspirin, Ciprofloxacin, Diovan Valsartan, Fosamax Alendronate Sodium, Sulfa Antibiotics)          Discharge Medications: Please see discharge summary for a list of discharge medications. DISCHARGE MEDICATIONS:     Medication List    TAKE these medications   acetaminophen 500 MG tablet Commonly known as:  TYLENOL Take 1,000 mg by mouth every 4 (four) hours as needed for mild pain or headache.  amLODipine 2.5 MG tablet Commonly known as:  NORVASC Take 2.5 mg by mouth daily.  cefUROXime 500 MG tablet Commonly known as:  CEFTIN Take 1 tablet (500 mg total) by mouth 2 (two) times daily with a meal.  cholecalciferol 1000 units tablet Commonly known as:  VITAMIN D Take 2,000 Units by mouth daily.  CoQ-10 100 MG Caps Take 100 mg by mouth daily.  donepezil 5 MG tablet Commonly known as:  ARICEPT Take 5 mg by mouth at bedtime.  esomeprazole 40 MG capsule Commonly known as:  NEXIUM Take 40 mg by mouth daily before breakfast.  fosfomycin 3 g Pack Commonly known as:  MONUROL Take 3 g by mouth See admin instructions. Pt is to take once every ten days.  glucose 4 GM chewable tablet Chew 4 tablets by mouth as needed (for blood sugar less than 80).  hydrALAZINE 50 MG tablet Commonly known as:  APRESOLINE Take 50 mg by mouth 2 (two) times daily.  insulin glargine 100 UNIT/ML injection  Commonly known as:  LANTUS Inject 17 Units into the skin at bedtime.  insulin lispro 100 UNIT/ML injection Commonly known as:  HUMALOG Inject 4-5 Units into the skin 3 (three) times daily. Pt uses 5 units after breakfast, 5 units before lunch, and 4 units before dinner.  levothyroxine 50 MCG tablet Commonly known as:  SYNTHROID, LEVOTHROID Take 50 mcg by mouth daily before breakfast.  losartan 25 MG tablet Commonly known as:  COZAAR Take 25 mg by mouth daily.  ondansetron 4 MG tablet Commonly known as:  ZOFRAN Take 4 mg by mouth every 8 (eight) hours as needed for nausea or  vomiting.  QUEtiapine 50 MG tablet Commonly known as:  SEROQUEL Take 150 mg by mouth at bedtime.  sertraline 100 MG tablet Commonly known as:  ZOLOFT Take 150 mg by mouth daily.  vitamin B-12 1000 MCG tablet Commonly known as:  CYANOCOBALAMIN Take 1,000 mcg by mouth daily.    Relevant Imaging Results: Relevant Lab Results: Additional Information  ([id number])  Nayab Aten, Darleen Crocker, LCSW

## 2016-02-10 NOTE — Progress Notes (Signed)
Patient is alert, hard of hearing, forgetfulness, on room air, denies pain, fair appetite, daughter at bedside, pt is d/c to [facility] assisted [facility], will continue on po antibiotics for 5 days, rx provided to patients daughter, pt recommended to f/u with facility within 1-2 weeks. D/c instructions reviewed with patient and daughter, daughter verbalizes understanding of d/c instructions, patient pushed to visitor entrance via Psychiatrist, transportation provided via family.

## 2016-02-10 NOTE — Discharge Summary (Signed)
[employer] - [place] [facility]   PATIENT NAME: Julie Burch    MR#:  [id number]  DATE OF BIRTH:  01-14-1927  DATE OF ADMISSION:  02/08/2016 ADMITTING PHYSICIAN: Oralia Manis, MD  DATE OF DISCHARGE: 02/10/2016  PRIMARY CARE PHYSICIAN: Mariam Dollar, MD    ADMISSION DIAGNOSIS:  Dehydration [E86.0] Agitation [R45.1] Hyperglycemia [R73.9] Acute urinary tract infection [N39.0]  DISCHARGE DIAGNOSIS:  Principal Problem:   Recurrent UTI Active Problems:   GERD (gastroesophageal reflux disease)   HTN (hypertension)   Hypothyroidism   Depression   Acute on chronic kidney failure (HCC)   Diabetes (HCC)   SECONDARY DIAGNOSIS:   Past Medical History:  Diagnosis Date  . Depression   . Diabetes mellitus without complication (HCC)   . GERD (gastroesophageal reflux disease)   . Hypertension   . Recurrent UTI   . Stroke [facility])    TIA    HOSPITAL COURSE:   82 year old female with history of previous CVA, dementia, hypertension, GERD, diabetes, history of recurrent UTIs who presented to the hospital due to altered mental status abdominal pain and noted to have urinary tract infection.  1. Urinary tract infection-patient has had a previous history of Escherichia coli UTI.  - while in the hospital pt. Was treated with IV Ceftriaxone and now being discharged on Oral Ceftin.  - she has been afebrile, hemodynamically stable and her mental status has improved.  Urine cultures are still not growing anything but she has improved.   2. Acute kidney injury-secondary to dehydration/UTI.  - pt. Was given gentle IV fluids and this has improved and resolved now.    3. Dementia-she will continue Aricept.  4. Essential hypertension- she will continue Norvasc, hydralazine  5. Hypothyroidism- she will continue Synthroid.  6. Diabetes type 2 without compensation- she will continue Lantus, Lispro with meals.   7. GERD- she will resume her Nexium upon discharge.   8.  Depression - she will cont. Seroquel, Zoloft.    DISCHARGE CONDITIONS:   Stable  CONSULTS OBTAINED:    DRUG ALLERGIES:   Allergies  Allergen Reactions  . Aspirin Other (See Comments)    Reaction:  Intracranial hemorrhage   . Ciprofloxacin Other (See Comments)    Reaction:  Hallucinations/severe agitation    . Diovan [Valsartan] Other (See Comments)    Reaction:  High potassium levels   . Fosamax [Alendronate Sodium] Other (See Comments)    Reaction:  GI upset   . Sulfa Antibiotics Hives    DISCHARGE MEDICATIONS:     Medication List    TAKE these medications   acetaminophen 500 MG tablet Commonly known as:  TYLENOL Take 1,000 mg by mouth every 4 (four) hours as needed for mild pain or headache.   amLODipine 2.5 MG tablet Commonly known as:  NORVASC Take 2.5 mg by mouth daily.   cefUROXime 500 MG tablet Commonly known as:  CEFTIN Take 1 tablet (500 mg total) by mouth 2 (two) times daily with a meal.   cholecalciferol 1000 units tablet Commonly known as:  VITAMIN D Take 2,000 Units by mouth daily.   CoQ-10 100 MG Caps Take 100 mg by mouth daily.   donepezil 5 MG tablet Commonly known as:  ARICEPT Take 5 mg by mouth at bedtime.   esomeprazole 40 MG capsule Commonly known as:  NEXIUM Take 40 mg by mouth daily before breakfast.   fosfomycin 3 g Pack Commonly known as:  MONUROL Take 3 g by mouth See admin instructions. Pt  is to take once every ten days.   glucose 4 GM chewable tablet Chew 4 tablets by mouth as needed (for blood sugar less than 80).   hydrALAZINE 50 MG tablet Commonly known as:  APRESOLINE Take 50 mg by mouth 2 (two) times daily.   insulin glargine 100 UNIT/ML injection Commonly known as:  LANTUS Inject 17 Units into the skin at bedtime.   insulin lispro 100 UNIT/ML injection Commonly known as:  HUMALOG Inject 4-5 Units into the skin 3 (three) times daily. Pt uses 5 units after breakfast, 5 units before lunch, and 4 units before  dinner.   levothyroxine 50 MCG tablet Commonly known as:  SYNTHROID, LEVOTHROID Take 50 mcg by mouth daily before breakfast.   losartan 25 MG tablet Commonly known as:  COZAAR Take 25 mg by mouth daily.   ondansetron 4 MG tablet Commonly known as:  ZOFRAN Take 4 mg by mouth every 8 (eight) hours as needed for nausea or vomiting.   QUEtiapine 50 MG tablet Commonly known as:  SEROQUEL Take 150 mg by mouth at bedtime.   sertraline 100 MG tablet Commonly known as:  ZOLOFT Take 150 mg by mouth daily.   vitamin B-12 1000 MCG tablet Commonly known as:  CYANOCOBALAMIN Take 1,000 mcg by mouth daily.         DISCHARGE INSTRUCTIONS:   DIET:  Cardiac diet  DISCHARGE CONDITION:  Stable  ACTIVITY:  Activity as tolerated  OXYGEN:  Home Oxygen: No.   Oxygen Delivery: room air  DISCHARGE LOCATION:  Assisted Living.    If you experience worsening of your admission symptoms, develop shortness of breath, life threatening emergency, suicidal or homicidal thoughts you must seek medical attention immediately by calling 911 or calling your MD immediately  if symptoms less severe.  You Must read complete instructions/literature along with all the possible adverse reactions/side effects for all the Medicines you take and that have been prescribed to you. Take any new Medicines after you have completely understood and accpet all the possible adverse reactions/side effects.   Please note  You were cared for by a hospitalist during your hospital stay. If you have any questions about your discharge medications or the care you received while you were in the hospital after you are discharged, you can call the unit and asked to speak with the hospitalist on call if the hospitalist that took care of you is not available. Once you are discharged, your primary care physician will handle any further medical issues. Please note that NO REFILLS for any discharge medications will be authorized once  you are discharged, as it is imperative that you return to your primary care physician (or establish a relationship with a primary care physician if you do not have one) for your aftercare needs so that they can reassess your need for medications and monitor your lab values.     Today   No fever overnight. Mental status improved. No complaints.  Daughter at bedside.   VITAL SIGNS:  Blood pressure 115/74, pulse (!) 124, temperature 98.1 F (36.7 C), temperature source Oral, resp. rate 20, height 5' (1.524 m), weight 57.4 kg (126 lb 9.6 oz), SpO2 97 %.  I/O:   Intake/Output Summary (Last 24 hours) at 02/10/16 0931 Last data filed at 02/10/16 0500  Gross per 24 hour  Intake              650 ml  Output  0 ml  Net              650 ml    PHYSICAL EXAMINATION:   GENERAL:  82 y.o.-year-old patient lying in the bed with no acute distress.  EYES: Pupils equal, round, reactive to light and accommodation. No scleral icterus. Extraocular muscles intact.  HEENT: Head atraumatic, normocephalic. Oropharynx and nasopharynx clear.  NECK:  Supple, no jugular venous distention. No thyroid enlargement, no tenderness.  LUNGS: Normal breath sounds bilaterally, no wheezing, rales, rhonchi. No use of accessory muscles of respiration.  CARDIOVASCULAR: S1, S2 normal. II/VI SEM at LSB, No rubs, or gallops.  ABDOMEN: Soft, nontender, nondistended. Bowel sounds present. No organomegaly or mass.  EXTREMITIES: No cyanosis, clubbing or edema b/l.    NEUROLOGIC: Cranial nerves II through XII are intact. No focal Motor or sensory deficits b/l.   PSYCHIATRIC: The patient is alert and oriented x 2.  SKIN: No obvious rash, lesion, or ulcer.   DATA REVIEW:   CBC  Recent Labs Lab 02/09/16 0449  WBC 7.2  HGB 9.6*  HCT 28.0*  PLT 137*    Chemistries   Recent Labs Lab 02/09/16 0449  NA 135  K 4.1  CL 105  CO2 25  GLUCOSE 116*  BUN 37*  CREATININE 1.34*  CALCIUM 8.3*    Cardiac  Enzymes No results for input(s): TROPONINI in the last 168 hours.    RADIOLOGY:  Ct Renal Stone Study  Result Date: 02/08/2016 CLINICAL DATA:  82 year old female with recent fall with worsening right lower back pain. EXAM: CT ABDOMEN AND PELVIS WITHOUT CONTRAST TECHNIQUE: Multidetector CT imaging of the abdomen and pelvis was performed following the standard protocol without IV contrast. COMPARISON:  CT dated 04/30/2015 and radiograph dated 10/26/2015 FINDINGS: Evaluation of this exam is limited in the absence of intravenous contrast. Minimal bibasilar atelectatic changes of the lungs with bibasilar interstitial prominence, likely mild interstitial edema. No focal consolidation. There is coronary vascular calcification and calcification of the mitral annulus. There is hypoattenuation of the cardiac blood pool suggestive of a degree of anemia. Clinical correlation is recommended. No intra-abdominal free air or free fluid. A 1 cm right hepatic hypodense lesion is not characterized but may represent cysts or hemangioma. The liver is otherwise unremarkable. The gallbladder, pancreas, spleen, and the adrenal glands appear unremarkable with bilateral renal vascular calcification noted. There is no hydronephrosis or nephrolithiasis on either side. The visualized ureters and urinary bladder appear unremarkable with small pockets of gas within the urinary bladder likely iatrogenic. Correlation with history of recent instrumentation recommended. The uterus and ovaries are grossly unremarkable. Evaluation of the bowel is limited in the absence of oral contrast. There is no evidence of bowel obstruction or active inflammation. The appendix is not visualized with certainty. No inflammatory changes identified in the right lower quadrant. There is advanced aortoiliac atherosclerotic disease. There is calcification of splenic artery as well as mesentery vasculature. Evaluation of the aorta and vasculature is limited on this  noncontrast study. No portal venous gas identified. There is no adenopathy. The abdominal wall soft tissues appear unremarkable with no fluid collection or hematoma. There is advanced osteopenia with degenerative changes of the spine. Multiple old right posterior rib fractures. Chronic appearing L1 and L4 compression fractures of the posterior corner of the superior endplates measuring approximately 7 mm at the level of L1. There is associated mild to moderate focal narrowing of the central canal similar to prior study. No definite acute fracture. IMPRESSION: No acute/traumatic  intra-abdominal pelvic pathology. Advanced osteopenia with degenerative changes of the spine. L1 and L4 compression deformities as seen on the prior study with slight retropulsion of the posterior corner of the superior endplates and mild focal central canal narrowing. No definite acute fracture. Electronically Signed   By: Elgie Collard M.D.   On: 02/08/2016 23:27      Management plans discussed with the patient, family and they are in agreement.  CODE STATUS:     Code Status Orders        Start     Ordered   02/09/16 0059  Full code  Continuous     02/09/16 0058    Code Status History    Date Active Date Inactive Code Status Order ID Comments User Context   This patient has a current code status but no historical code status.    Advance Directive Documentation   Flowsheet Row Most Recent Value  Type of Advance Directive  Healthcare Power of Attorney, Out of facility DNR (pink MOST or yellow form)  Pre-existing out of facility DNR order (yellow form or pink MOST form)  Yellow form placed in chart (order not valid for inpatient use)  "MOST" Form in Place?  No data      TOTAL TIME TAKING CARE OF THIS PATIENT: 40 minutes.    Houston Siren M.D on 02/10/2016 at 9:31 AM  Between 7am to 6pm - Pager - [phone]  After 6pm go to [email or web address] - password [email or web address] [facility]  [facility]  Office   [phone]  CC: Primary care physician; Mariam Dollar, MD

## 2016-02-10 NOTE — Progress Notes (Signed)
Patient is medically stable for D/C back to [facility] today. Per [facility] at [facility] patient can return today. Clinical Child psychotherapist (CSW) sent D/C orders to [facility]. Patient's daughter Dois Davenport will transport patient.RN aware of above. Please reconsult if future social work needs arise. CSW signing off.   [employer], LCSW [phone]

## 2016-05-06 ENCOUNTER — Emergency Department
Admission: EM | Admit: 2016-05-06 | Discharge: 2016-05-06 | Disposition: A | Discharge status: Home / Self Care | Payer: Medicare Other | Attending: Emergency Medicine | Admitting: Emergency Medicine

## 2016-05-06 ENCOUNTER — Emergency Department: Payer: Medicare Other

## 2016-05-06 DIAGNOSIS — E119 Type 2 diabetes mellitus without complications: Secondary | ICD-10-CM | POA: Diagnosis not present

## 2016-05-06 DIAGNOSIS — I10 Essential (primary) hypertension: Secondary | ICD-10-CM | POA: Diagnosis not present

## 2016-05-06 DIAGNOSIS — M545 Low back pain, unspecified: Secondary | ICD-10-CM

## 2016-05-06 DIAGNOSIS — Z87891 Personal history of nicotine dependence: Secondary | ICD-10-CM | POA: Insufficient documentation

## 2016-05-06 DIAGNOSIS — F0390 Unspecified dementia without behavioral disturbance: Secondary | ICD-10-CM | POA: Diagnosis not present

## 2016-05-06 DIAGNOSIS — N390 Urinary tract infection, site not specified: Secondary | ICD-10-CM | POA: Insufficient documentation

## 2016-05-06 DIAGNOSIS — M542 Cervicalgia: Secondary | ICD-10-CM | POA: Insufficient documentation

## 2016-05-06 DIAGNOSIS — R0789 Other chest pain: Secondary | ICD-10-CM | POA: Insufficient documentation

## 2016-05-06 DIAGNOSIS — Y999 Unspecified external cause status: Secondary | ICD-10-CM | POA: Insufficient documentation

## 2016-05-06 DIAGNOSIS — Z79899 Other long term (current) drug therapy: Secondary | ICD-10-CM | POA: Diagnosis not present

## 2016-05-06 DIAGNOSIS — Y929 Unspecified place or not applicable: Secondary | ICD-10-CM | POA: Insufficient documentation

## 2016-05-06 DIAGNOSIS — W19XXXA Unspecified fall, initial encounter: Secondary | ICD-10-CM | POA: Insufficient documentation

## 2016-05-06 DIAGNOSIS — Z794 Long term (current) use of insulin: Secondary | ICD-10-CM | POA: Insufficient documentation

## 2016-05-06 DIAGNOSIS — E039 Hypothyroidism, unspecified: Secondary | ICD-10-CM | POA: Diagnosis not present

## 2016-05-06 DIAGNOSIS — Y939 Activity, unspecified: Secondary | ICD-10-CM | POA: Diagnosis not present

## 2016-05-06 LAB — BASIC METABOLIC PANEL
Anion gap: 6 (ref 5–15)
BUN: 25 mg/dL — ABNORMAL HIGH (ref 6–20)
CALCIUM: 8.5 mg/dL — AB (ref 8.9–10.3)
CO2: 26 mmol/L (ref 22–32)
CREATININE: 1.29 mg/dL — AB (ref 0.44–1.00)
Chloride: 103 mmol/L (ref 101–111)
GFR calc non Af Amer: 36 mL/min — ABNORMAL LOW (ref >60)
GFR, EST AFRICAN AMERICAN: 42 mL/min — AB (ref >60)
GLUCOSE: 320 mg/dL — AB (ref 65–99)
Potassium: 4.7 mmol/L (ref 3.5–5.1)
Sodium: 135 mmol/L (ref 135–145)

## 2016-05-06 LAB — URINALYSIS COMPLETE WITH MICROSCOPIC (ARMC ONLY)
BILIRUBIN URINE: NEGATIVE
GLUCOSE, UA: NEGATIVE mg/dL
Hgb urine dipstick: NEGATIVE
Ketones, ur: NEGATIVE mg/dL
Nitrite: NEGATIVE
Protein, ur: 30 mg/dL — AB
Specific Gravity, Urine: 1.016 (ref 1.005–1.030)
pH: 5 (ref 5.0–8.0)

## 2016-05-06 LAB — CBC
HCT: 34.4 % — ABNORMAL LOW (ref 35.0–47.0)
Hemoglobin: 11.5 g/dL — ABNORMAL LOW (ref 12.0–16.0)
MCH: 31.4 pg (ref 26.0–34.0)
MCHC: 33.6 g/dL (ref 32.0–36.0)
MCV: 93.4 fL (ref 80.0–100.0)
PLATELETS: 159 10*3/uL (ref 150–440)
RBC: 3.68 MIL/uL — ABNORMAL LOW (ref 3.80–5.20)
RDW: 16.4 % — ABNORMAL HIGH (ref 11.5–14.5)
WBC: 4.4 10*3/uL (ref 3.6–11.0)

## 2016-05-06 LAB — GLUCOSE, CAPILLARY: Glucose-Capillary: 248 mg/dL — ABNORMAL HIGH (ref 65–99)

## 2016-05-06 MED ORDER — CEFTRIAXONE SODIUM 1 G IJ SOLR: 1.0000 g | Freq: Once | INTRAMUSCULAR | Status: DC

## 2016-05-06 MED ORDER — TRAMADOL HCL 50 MG PO TABS
50.0000 mg | ORAL_TABLET | Freq: Once | ORAL | Status: AC
  Administered 2016-05-06: 50 mg via ORAL
  Filled 2016-05-06: qty 1

## 2016-05-06 MED ORDER — CEPHALEXIN 500 MG PO CAPS: 500.0000 mg | ORAL_CAPSULE | Freq: Three times a day (TID) | ORAL | 0 refills | Status: AC

## 2016-05-06 MED ORDER — CEFTRIAXONE SODIUM-DEXTROSE 1-3.74 GM-% IV SOLR
1.0000 g | Freq: Once | INTRAVENOUS | Status: AC
  Administered 2016-05-06: 1 g via INTRAVENOUS
  Filled 2016-05-06: qty 50

## 2016-05-06 NOTE — ED Triage Notes (Signed)
[facility] Pt had fall x 1 week ago, unsure if fall was witnessed. Pt c/o back pain and neck pain. History of Alzheimer dementia. On blood thinners.

## 2016-05-06 NOTE — ED Notes (Signed)
Pt leaving with EMS. Nurse and daughter finished cleaning up pt and placing clothes on her. Pt in NAD

## 2016-05-06 NOTE — ED Provider Notes (Addendum)
[facility] Emergency Department Provider Note  ____________________________________________   I have reviewed the triage vital signs and the nursing notes.   HISTORY  Chief Complaint Fall and Back Pain    HPI Julie DikeMary Burch is a 82 y.o. female who has a history of recurrent falls. Apparently she fell last night. She herself cannot give a history. History is mostly per family. Patient herself has no complaints at this time. Family is concerned about her back pain. She does have pain when she moves she states. She is at her baseline according to family. CLevel 5 chart caveat; no further history available due to patient status.         Past Medical History:  Diagnosis Date  . Depression   . Diabetes mellitus without complication (HCC)   . GERD (gastroesophageal reflux disease)   . Hypertension   . Recurrent UTI   . Stroke [facility](HCC)    TIA    Patient Active Problem List   Diagnosis Date Noted  . Recurrent UTI 02/09/2016  . GERD (gastroesophageal reflux disease) 02/09/2016  . HTN (hypertension) 02/09/2016  . Hypothyroidism 02/09/2016  . Depression 02/09/2016  . Acute on chronic kidney failure (HCC) 02/09/2016  . Diabetes (HCC) 02/09/2016    Past Surgical History:  Procedure Laterality Date  . TONSILLECTOMY      Prior to Admission medications   Medication Sig Start Date End Date Taking? Authorizing Provider  acetaminophen (TYLENOL) 500 MG tablet Take 1,000 mg by mouth every 4 (four) hours as needed for mild pain or headache.    Historical Provider, MD  amLODipine (NORVASC) 2.5 MG tablet Take 2.5 mg by mouth daily.    Historical Provider, MD  cefUROXime (CEFTIN) 500 MG tablet Take 1 tablet (500 mg total) by mouth 2 (two) times daily with a meal. 02/10/16   Houston SirenVivek J Sainani, MD  cholecalciferol (VITAMIN D) 1000 units tablet Take 2,000 Units by mouth daily.    Historical Provider, MD  Coenzyme Q10 (COQ-10) 100 MG CAPS Take 100 mg by mouth daily.     Historical Provider, MD  donepezil (ARICEPT) 5 MG tablet Take 5 mg by mouth at bedtime.    Historical Provider, MD  esomeprazole (NEXIUM) 40 MG capsule Take 40 mg by mouth daily before breakfast.    Historical Provider, MD  fosfomycin (MONUROL) 3 g PACK Take 3 g by mouth See admin instructions. Pt is to take once every ten days.    Historical Provider, MD  glucose 4 GM chewable tablet Chew 4 tablets by mouth as needed (for blood sugar less than 80).    Historical Provider, MD  hydrALAZINE (APRESOLINE) 50 MG tablet Take 50 mg by mouth 2 (two) times daily.    Historical Provider, MD  insulin glargine (LANTUS) 100 UNIT/ML injection Inject 17 Units into the skin at bedtime.     Historical Provider, MD  insulin lispro (HUMALOG) 100 UNIT/ML injection Inject 4-5 Units into the skin 3 (three) times daily. Pt uses 5 units after breakfast, 5 units before lunch, and 4 units before dinner.    Historical Provider, MD  levothyroxine (SYNTHROID, LEVOTHROID) 50 MCG tablet Take 50 mcg by mouth daily before breakfast.    Historical Provider, MD  losartan (COZAAR) 25 MG tablet Take 25 mg by mouth daily.    Historical Provider, MD  ondansetron (ZOFRAN) 4 MG tablet Take 4 mg by mouth every 8 (eight) hours as needed for nausea or vomiting.    Historical Provider, MD  QUEtiapine (SEROQUEL)  50 MG tablet Take 150 mg by mouth at bedtime.     Historical Provider, MD  sertraline (ZOLOFT) 100 MG tablet Take 150 mg by mouth daily.    Historical Provider, MD  vitamin B-12 (CYANOCOBALAMIN) 1000 MCG tablet Take 1,000 mcg by mouth daily.    Historical Provider, MD    Allergies Aspirin; Ciprofloxacin; Diovan [valsartan]; Fosamax [alendronate sodium]; and Sulfa antibiotics  No family history on file.  Social History Social History  Substance Use Topics  . Smoking status: Former Games developer  . Smokeless tobacco: Never Used  . Alcohol use No    Review of Systems Level 5 chart caveat; no further history available due to patient  status.  ____________________________________________   PHYSICAL EXAM:  VITAL SIGNS: ED Triage Vitals  Enc Vitals Group     BP 05/06/16 0853 (!) 161/47     Pulse Rate 05/06/16 0853 60     Resp 05/06/16 0853 20     Temp 05/06/16 0853 97.6 F (36.4 C)     Temp Source 05/06/16 0853 Oral     SpO2 05/06/16 0853 97 %     Weight --      Height 05/06/16 0854 5\' 2"  (1.575 m)     Head Circumference --      Peak Flow --      Pain Score --      Pain Loc --      Pain Edu? --      Excl. in GC? --     Constitutional: Alert and orientedTo name, in no acute distress. Well appearing and in no acute distress. Eyes: Conjunctivae are normal. PERRL. EOMI. Head: Atraumatic. Nose: No congestion/rhinnorhea. Mouth/Throat: Mucous membranes are moist.  Oropharynx non-erythematous. Neck: No stridor.   Nontender with no meningismus Cardiovascular: Normal rate, regular rhythm. Grossly normal heart sounds.  Good peripheral circulation. Respiratory: Normal respiratory effort.  No retractions. Lungs CTAB. Chest: Tenderness to palpation left chest wall no flail chest and no crepitus Abdominal: Soft and nontender. No distention. No guarding no rebound Back:  There is palpation around L2-L3 there are no lesions noted. there is no CVA tenderness Musculoskeletal: No lower extremity tenderness, no upper extremity tenderness. No joint effusions, no DVT signs strong distal pulses no edema Neurologic:  Normal speech and language. No gross focal neurologic deficits are appreciated.  Skin:  Skin is warm, dry and intact. No rash noted. Psychiatric: Mood and affect are normal. Speech and behavior are normal.  ____________________________________________   LABS (all labs ordered are listed, but only abnormal results are displayed)  Labs Reviewed  BASIC METABOLIC PANEL - Abnormal; Notable for the following:       Result Value   Glucose, Bld 320 (*)    BUN 25 (*)    Creatinine, Ser 1.29 (*)    Calcium 8.5 (*)     GFR calc non Af Amer 36 (*)    GFR calc Af Amer 42 (*)    All other components within normal limits  CBC - Abnormal; Notable for the following:    RBC 3.68 (*)    Hemoglobin 11.5 (*)    HCT 34.4 (*)    RDW 16.4 (*)    All other components within normal limits  URINALYSIS COMPLETEWITH MICROSCOPIC ([facility] ONLY) - Abnormal; Notable for the following:    Color, Urine YELLOW (*)    APPearance HAZY (*)    Protein, ur 30 (*)    Leukocytes, UA 2+ (*)    Bacteria, UA RARE (*)  Squamous Epithelial / LPF 0-5 (*)    All other components within normal limits  GLUCOSE, CAPILLARY - Abnormal; Notable for the following:    Glucose-Capillary 248 (*)    All other components within normal limits  URINE CULTURE   ____________________________________________  EKG  I personally interpreted any EKGs ordered by me or triage ______  Sinus rhythm rate 60 bpm, nonspecific ST changes noted, no acute ischemia noted. ________________________________  RADIOLOGY  I reviewed any imaging ordered by me or triage that were performed during my shift and, if possible, patient and/or family made aware of any abnormal findings. ____________________________________________   PROCEDURES  Procedure(s) performed: None  Procedures  Critical Care performed: None  ____________________________________________   INITIAL IMPRESSION / ASSESSMENT AND PLAN / ED COURSE  Pertinent labs & imaging results that were available during my care of the patient were reviewed by me and considered in my medical decision making (see chart for details). Patient had a fall, with no known loss of consciousness. She is at her baseline today. She does have some reversal chest wall pain with a negative chest x-ray. Lumbar film shows what are probably chronic mild compression issues with and the family states she does have chronic low back pain and this is not apparently different. CT head and neck are negative. The patient is well  appearing at this time. Family did request that she have a tramadol for her various pains which we have given her. Otherwise, reassuring workup with no evidence of syncope. She does have what appears to be urinary tract infection. We'll start her on antibiotics. Her last culture does show that there is a sensitive to Rocephin. We have also sent a culture this time. She does not appear to be uroseptic. Family would prefer to go home if possible.  ----------------------------------------- 2:27 PM on 05/06/2016 -----------------------------------------  Patient no acute distress, feels much better after her pain medication, we discussed with the family admitting the patient to the hospital. Family feel very strongly that she would do better on discharge. We will send her home with antibiotics. Urine culture is pending. Return precautions given and understood  Clinical Course   ____________________________________________   FINAL CLINICAL IMPRESSION(S) / ED DIAGNOSES  Final diagnoses:  None      This chart was dictated using voice recognition software.  Despite best efforts to proofread,  errors can occur which can change meaning.      Jeanmarie PlantJames A Keara Pagliarulo, MD 05/06/16 1408    Jeanmarie PlantJames A Irean Kendricks, MD 05/06/16 1428    Jeanmarie PlantJames A Jasmaine Rochel, MD 05/06/16 1440

## 2016-05-09 LAB — URINE CULTURE

## 2016-06-05 ENCOUNTER — Encounter: Payer: Self-pay | Admitting: Emergency Medicine

## 2016-06-05 ENCOUNTER — Emergency Department: Payer: Medicare Other

## 2016-06-05 ENCOUNTER — Emergency Department
Admission: EM | Admit: 2016-06-05 | Discharge: 2016-06-05 | Disposition: A | Discharge status: Home / Self Care | Payer: Medicare Other | Source: Home / Self Care | Attending: Emergency Medicine | Admitting: Emergency Medicine

## 2016-06-05 DIAGNOSIS — S0083XA Contusion of other part of head, initial encounter: Secondary | ICD-10-CM | POA: Insufficient documentation

## 2016-06-05 DIAGNOSIS — Y929 Unspecified place or not applicable: Secondary | ICD-10-CM

## 2016-06-05 DIAGNOSIS — Z794 Long term (current) use of insulin: Secondary | ICD-10-CM

## 2016-06-05 DIAGNOSIS — E119 Type 2 diabetes mellitus without complications: Secondary | ICD-10-CM

## 2016-06-05 DIAGNOSIS — Z79899 Other long term (current) drug therapy: Secondary | ICD-10-CM

## 2016-06-05 DIAGNOSIS — W19XXXA Unspecified fall, initial encounter: Secondary | ICD-10-CM | POA: Insufficient documentation

## 2016-06-05 DIAGNOSIS — N3000 Acute cystitis without hematuria: Secondary | ICD-10-CM

## 2016-06-05 DIAGNOSIS — Z87891 Personal history of nicotine dependence: Secondary | ICD-10-CM

## 2016-06-05 DIAGNOSIS — Y939 Activity, unspecified: Secondary | ICD-10-CM

## 2016-06-05 DIAGNOSIS — E039 Hypothyroidism, unspecified: Secondary | ICD-10-CM

## 2016-06-05 DIAGNOSIS — S0990XA Unspecified injury of head, initial encounter: Secondary | ICD-10-CM

## 2016-06-05 DIAGNOSIS — Y999 Unspecified external cause status: Secondary | ICD-10-CM | POA: Insufficient documentation

## 2016-06-05 DIAGNOSIS — A419 Sepsis, unspecified organism: Secondary | ICD-10-CM | POA: Diagnosis not present

## 2016-06-05 DIAGNOSIS — I10 Essential (primary) hypertension: Secondary | ICD-10-CM | POA: Insufficient documentation

## 2016-06-05 LAB — BASIC METABOLIC PANEL
ANION GAP: 9 (ref 5–15)
BUN: 25 mg/dL — ABNORMAL HIGH (ref 6–20)
CALCIUM: 9 mg/dL (ref 8.9–10.3)
CO2: 24 mmol/L (ref 22–32)
Chloride: 104 mmol/L (ref 101–111)
Creatinine, Ser: 1.34 mg/dL — ABNORMAL HIGH (ref 0.44–1.00)
GFR calc Af Amer: 40 mL/min — ABNORMAL LOW (ref >60)
GFR, EST NON AFRICAN AMERICAN: 35 mL/min — AB (ref >60)
Glucose, Bld: 62 mg/dL — ABNORMAL LOW (ref 65–99)
POTASSIUM: 5.7 mmol/L — AB (ref 3.5–5.1)
SODIUM: 137 mmol/L (ref 135–145)

## 2016-06-05 LAB — URINALYSIS COMPLETE WITH MICROSCOPIC (ARMC ONLY)
BILIRUBIN URINE: NEGATIVE
Bacteria, UA: NONE SEEN
GLUCOSE, UA: NEGATIVE mg/dL
HGB URINE DIPSTICK: NEGATIVE
Ketones, ur: NEGATIVE mg/dL
LEUKOCYTES UA: NEGATIVE
NITRITE: POSITIVE — AB
Protein, ur: NEGATIVE mg/dL
SPECIFIC GRAVITY, URINE: 1.01 (ref 1.005–1.030)
pH: 7 (ref 5.0–8.0)

## 2016-06-05 LAB — TROPONIN I: TROPONIN I: 0.03 ng/mL — AB (ref <0.03)

## 2016-06-05 LAB — CBC WITH DIFFERENTIAL/PLATELET
BASOS ABS: 0 10*3/uL (ref 0–0.1)
BASOS PCT: 1 %
EOS ABS: 0.2 10*3/uL (ref 0–0.7)
EOS PCT: 3 %
HCT: 37.6 % (ref 35.0–47.0)
Hemoglobin: 12.5 g/dL (ref 12.0–16.0)
Lymphocytes Relative: 19 %
Lymphs Abs: 1.2 10*3/uL (ref 1.0–3.6)
MCH: 30.9 pg (ref 26.0–34.0)
MCHC: 33.2 g/dL (ref 32.0–36.0)
MCV: 93.1 fL (ref 80.0–100.0)
Monocytes Absolute: 0.6 10*3/uL (ref 0.2–0.9)
Monocytes Relative: 9 %
Neutro Abs: 4.4 10*3/uL (ref 1.4–6.5)
Neutrophils Relative %: 68 %
PLATELETS: 167 10*3/uL (ref 150–440)
RBC: 4.03 MIL/uL (ref 3.80–5.20)
RDW: 16.7 % — AB (ref 11.5–14.5)
WBC: 6.4 10*3/uL (ref 3.6–11.0)

## 2016-06-05 LAB — GLUCOSE, CAPILLARY: GLUCOSE-CAPILLARY: 99 mg/dL (ref 65–99)

## 2016-06-05 MED ORDER — ACETAMINOPHEN 325 MG PO TABS
650.0000 mg | ORAL_TABLET | Freq: Once | ORAL | Status: AC
  Administered 2016-06-05: 650 mg via ORAL

## 2016-06-05 MED ORDER — CEPHALEXIN 500 MG PO CAPS: 500.0000 mg | ORAL_CAPSULE | Freq: Three times a day (TID) | ORAL | 0 refills | Status: DC

## 2016-06-05 MED ORDER — CEPHALEXIN 500 MG PO CAPS
500.0000 mg | ORAL_CAPSULE | Freq: Once | ORAL | Status: AC
  Administered 2016-06-05: 500 mg via ORAL
  Filled 2016-06-05: qty 1

## 2016-06-05 MED ORDER — ACETAMINOPHEN 325 MG PO TABS
ORAL_TABLET | ORAL | Status: AC
  Filled 2016-06-05: qty 2

## 2016-06-05 NOTE — ED Notes (Signed)
Pt returned from CT scan.

## 2016-06-05 NOTE — ED Triage Notes (Signed)
Unwitnessed fall today per EMS. Pt has knot and laceration to head. From [facility]. Has dementia.

## 2016-06-05 NOTE — ED Notes (Signed)
Dr Alphonzo Lemmingsmcshane notified troponin 0.03

## 2016-06-05 NOTE — ED Provider Notes (Signed)
[facility] Emergency Department Provider Note  ____________________________________________   I have reviewed the triage vital signs and the nursing notes.   HISTORY  Chief Complaint Fall    HPI Julie Burch is a 82 y.o. female history of hypertension and frequent falls, had a non-witnessed fall. She's been seen here many times before for fall. Most recent was October 28.As a history of dementia. She is at her baseline from the last time she was seen here. Level 5 chart caveat; no further history available due to patient status. Patient is no longer on blood thinners. At baseline per family.    Past Medical History:  Diagnosis Date  . Depression   . Diabetes mellitus without complication (HCC)   . GERD (gastroesophageal reflux disease)   . Hypertension   . Recurrent UTI   . Stroke [facility](HCC)    TIA    Patient Active Problem List   Diagnosis Date Noted  . Recurrent UTI 02/09/2016  . GERD (gastroesophageal reflux disease) 02/09/2016  . HTN (hypertension) 02/09/2016  . Hypothyroidism 02/09/2016  . Depression 02/09/2016  . Acute on chronic kidney failure (HCC) 02/09/2016  . Diabetes (HCC) 02/09/2016    Past Surgical History:  Procedure Laterality Date  . TONSILLECTOMY      Prior to Admission medications   Medication Sig Start Date End Date Taking? Authorizing Provider  acetaminophen (TYLENOL) 500 MG tablet Take 1,000 mg by mouth every 4 (four) hours as needed for mild pain or headache.    Historical Provider, MD  amLODipine (NORVASC) 2.5 MG tablet Take 2.5 mg by mouth daily.    Historical Provider, MD  cefUROXime (CEFTIN) 500 MG tablet Take 1 tablet (500 mg total) by mouth 2 (two) times daily with a meal. 02/10/16   Houston SirenVivek J Sainani, MD  cholecalciferol (VITAMIN D) 1000 units tablet Take 2,000 Units by mouth daily.    Historical Provider, MD  Coenzyme Q10 (COQ-10) 100 MG CAPS Take 100 mg by mouth daily.    Historical Provider, MD  donepezil (ARICEPT)  5 MG tablet Take 5 mg by mouth at bedtime.    Historical Provider, MD  esomeprazole (NEXIUM) 40 MG capsule Take 40 mg by mouth daily before breakfast.    Historical Provider, MD  fosfomycin (MONUROL) 3 g PACK Take 3 g by mouth See admin instructions. Pt is to take once every ten days.    Historical Provider, MD  glucose 4 GM chewable tablet Chew 4 tablets by mouth as needed (for blood sugar less than 80).    Historical Provider, MD  hydrALAZINE (APRESOLINE) 50 MG tablet Take 50 mg by mouth 2 (two) times daily.    Historical Provider, MD  insulin glargine (LANTUS) 100 UNIT/ML injection Inject 17 Units into the skin at bedtime.     Historical Provider, MD  insulin lispro (HUMALOG) 100 UNIT/ML injection Inject 4-5 Units into the skin 3 (three) times daily. Pt uses 5 units after breakfast, 5 units before lunch, and 4 units before dinner.    Historical Provider, MD  levothyroxine (SYNTHROID, LEVOTHROID) 50 MCG tablet Take 50 mcg by mouth daily before breakfast.    Historical Provider, MD  losartan (COZAAR) 25 MG tablet Take 25 mg by mouth daily.    Historical Provider, MD  ondansetron (ZOFRAN) 4 MG tablet Take 4 mg by mouth every 8 (eight) hours as needed for nausea or vomiting.    Historical Provider, MD  QUEtiapine (SEROQUEL) 50 MG tablet Take 150 mg by mouth at bedtime.  Historical Provider, MD  sertraline (ZOLOFT) 100 MG tablet Take 150 mg by mouth daily.    Historical Provider, MD  vitamin B-12 (CYANOCOBALAMIN) 1000 MCG tablet Take 1,000 mcg by mouth daily.    Historical Provider, MD    Allergies Aspirin; Ciprofloxacin; Diovan [valsartan]; Fosamax [alendronate sodium]; and Sulfa antibiotics  History reviewed. No pertinent family history.  Social History Social History  Substance Use Topics  . Smoking status: Former Games developermoker  . Smokeless tobacco: Never Used  . Alcohol use No    Review of Systems Level 5 chart caveat; no further history available due to patient  status. ____________________________________________   PHYSICAL EXAM:  VITAL SIGNS: ED Triage Vitals  Enc Vitals Group     BP 06/05/16 1743 (!) 212/65     Pulse Rate 06/05/16 1743 65     Resp 06/05/16 1743 16     Temp 06/05/16 1743 97.8 F (36.6 C)     Temp Source 06/05/16 1743 Oral     SpO2 06/05/16 1743 98 %     Weight 06/05/16 1729 125 lb (56.7 kg)     Height --      Head Circumference --      Peak Flow --      Pain Score --      Pain Loc --      Pain Edu? --      Excl. in GC? --     Constitutional: Alert and orientedTo name only. Well appearing and in no acute distress. Eyes: Conjunctivae are normal. PERRL. EOMI. Head: Large hematoma to right forehead. Nose: No congestion/rhinnorhea. Mouth/Throat: Mucous membranes are moist.  Oropharynx non-erythematous. Neck: No stridor.   Nontender with no meningismus Cardiovascular: Normal rate, regular rhythm. Grossly normal heart sounds.  Good peripheral circulation. Respiratory: Normal respiratory effort.  No retractions. Lungs CTAB. Abdominal: Soft and nontender. No distention. No guarding no rebound Back:  There is no focal tenderness or step off.  there is no midline tenderness there are no lesions noted. there is no CVA tenderness Musculoskeletal: No lower extremity tenderness, no upper extremity tenderness. No joint effusions, no DVT signs strong distal pulses no edema Neurologic:  Normal speech and language. No gross focal neurologic deficits are appreciated.  Skin:  Skin is warm, dry and intact. No rash noted. Psychiatric: Mood and affect are normal. Speech and behavior are normal.  ____________________________________________   LABS (all labs ordered are listed, but only abnormal results are displayed)  Labs Reviewed  CBC WITH DIFFERENTIAL/PLATELET  BASIC METABOLIC PANEL  TROPONIN I  URINALYSIS COMPLETEWITH MICROSCOPIC ([facility] ONLY)   ____________________________________________  EKG  I personally interpreted  any EKGs ordered by me or triage Sinus rhythm rate 64 bpm no acute ST elevation or acute ST depression nonspecific ST changes ____________________________________________  RADIOLOGY  I reviewed any imaging ordered by me or triage that were performed during my shift and, if possible, patient and/or family made aware of any abnormal findings. ____________________________________________   PROCEDURES  Procedure(s) performed: None  Procedures  Critical Care performed: None  ____________________________________________   INITIAL IMPRESSION / ASSESSMENT AND PLAN / ED COURSE  Pertinent labs & imaging results that were available during my care of the patient were reviewed by me and considered in my medical decision making (see chart for details).  Patient here with a fall, again, unclear syncopal so we'll check basic blood work and urine. CT head and neck are reassuring. We'll give her Tylenol for pain. Remains neurologic intact. No evidence of hip fracture  low blood pressure.  Clinical Course    ____________________________________________   FINAL CLINICAL IMPRESSION(S) / ED DIAGNOSES  Final diagnoses:  None      This chart was dictated using voice recognition software.  Despite best efforts to proofread,  errors can occur which can change meaning.      Jeanmarie Plant, MD 06/05/16 [phone]

## 2016-06-05 NOTE — ED Notes (Signed)
Pt had unwitnessed fall. NAD. Quiet. Has dementia. Only c/o pain to head. Moving extremities. Denies neck pain.

## 2016-06-08 ENCOUNTER — Emergency Department: Payer: Medicare Other

## 2016-06-08 ENCOUNTER — Other Ambulatory Visit: Payer: Self-pay

## 2016-06-08 ENCOUNTER — Inpatient Hospital Stay
Admission: EM | Admit: 2016-06-08 | Discharge: 2016-06-15 | DRG: 871 | Disposition: A | Discharge status: Skilled Nursing Facility | Payer: Medicare Other | Attending: Internal Medicine | Admitting: Internal Medicine

## 2016-06-08 ENCOUNTER — Encounter: Payer: Self-pay | Admitting: Emergency Medicine

## 2016-06-08 DIAGNOSIS — R6521 Severe sepsis with septic shock: Secondary | ICD-10-CM

## 2016-06-08 DIAGNOSIS — Z886 Allergy status to analgesic agent status: Secondary | ICD-10-CM

## 2016-06-08 DIAGNOSIS — Z8673 Personal history of transient ischemic attack (TIA), and cerebral infarction without residual deficits: Secondary | ICD-10-CM | POA: Diagnosis not present

## 2016-06-08 DIAGNOSIS — R197 Diarrhea, unspecified: Secondary | ICD-10-CM | POA: Diagnosis present

## 2016-06-08 DIAGNOSIS — N3000 Acute cystitis without hematuria: Secondary | ICD-10-CM | POA: Diagnosis present

## 2016-06-08 DIAGNOSIS — Z882 Allergy status to sulfonamides status: Secondary | ICD-10-CM | POA: Diagnosis not present

## 2016-06-08 DIAGNOSIS — H9190 Unspecified hearing loss, unspecified ear: Secondary | ICD-10-CM | POA: Diagnosis present

## 2016-06-08 DIAGNOSIS — I4892 Unspecified atrial flutter: Secondary | ICD-10-CM | POA: Diagnosis present

## 2016-06-08 DIAGNOSIS — G9340 Encephalopathy, unspecified: Secondary | ICD-10-CM

## 2016-06-08 DIAGNOSIS — K219 Gastro-esophageal reflux disease without esophagitis: Secondary | ICD-10-CM | POA: Diagnosis present

## 2016-06-08 DIAGNOSIS — Z794 Long term (current) use of insulin: Secondary | ICD-10-CM | POA: Diagnosis not present

## 2016-06-08 DIAGNOSIS — I959 Hypotension, unspecified: Secondary | ICD-10-CM | POA: Diagnosis present

## 2016-06-08 DIAGNOSIS — A419 Sepsis, unspecified organism: Principal | ICD-10-CM | POA: Diagnosis present

## 2016-06-08 DIAGNOSIS — E875 Hyperkalemia: Secondary | ICD-10-CM | POA: Diagnosis present

## 2016-06-08 DIAGNOSIS — E11649 Type 2 diabetes mellitus with hypoglycemia without coma: Secondary | ICD-10-CM | POA: Diagnosis present

## 2016-06-08 DIAGNOSIS — Z79899 Other long term (current) drug therapy: Secondary | ICD-10-CM | POA: Diagnosis not present

## 2016-06-08 DIAGNOSIS — R1319 Other dysphagia: Secondary | ICD-10-CM | POA: Diagnosis present

## 2016-06-08 DIAGNOSIS — I10 Essential (primary) hypertension: Secondary | ICD-10-CM | POA: Diagnosis present

## 2016-06-08 DIAGNOSIS — N170 Acute kidney failure with tubular necrosis: Secondary | ICD-10-CM | POA: Diagnosis present

## 2016-06-08 DIAGNOSIS — Z87891 Personal history of nicotine dependence: Secondary | ICD-10-CM | POA: Diagnosis not present

## 2016-06-08 DIAGNOSIS — E1140 Type 2 diabetes mellitus with diabetic neuropathy, unspecified: Secondary | ICD-10-CM | POA: Diagnosis present

## 2016-06-08 DIAGNOSIS — I639 Cerebral infarction, unspecified: Secondary | ICD-10-CM | POA: Diagnosis present

## 2016-06-08 DIAGNOSIS — B9620 Unspecified Escherichia coli [E. coli] as the cause of diseases classified elsewhere: Secondary | ICD-10-CM | POA: Diagnosis present

## 2016-06-08 DIAGNOSIS — Z87440 Personal history of urinary (tract) infections: Secondary | ICD-10-CM | POA: Diagnosis not present

## 2016-06-08 DIAGNOSIS — E872 Acidosis: Secondary | ICD-10-CM | POA: Diagnosis not present

## 2016-06-08 DIAGNOSIS — G9341 Metabolic encephalopathy: Secondary | ICD-10-CM | POA: Diagnosis not present

## 2016-06-08 DIAGNOSIS — Z888 Allergy status to other drugs, medicaments and biological substances status: Secondary | ICD-10-CM | POA: Diagnosis not present

## 2016-06-08 DIAGNOSIS — Z789 Other specified health status: Secondary | ICD-10-CM

## 2016-06-08 DIAGNOSIS — Z66 Do not resuscitate: Secondary | ICD-10-CM | POA: Diagnosis present

## 2016-06-08 DIAGNOSIS — F0390 Unspecified dementia without behavioral disturbance: Secondary | ICD-10-CM | POA: Diagnosis present

## 2016-06-08 DIAGNOSIS — E1110 Type 2 diabetes mellitus with ketoacidosis without coma: Secondary | ICD-10-CM

## 2016-06-08 DIAGNOSIS — M6281 Muscle weakness (generalized): Secondary | ICD-10-CM

## 2016-06-08 LAB — COMPREHENSIVE METABOLIC PANEL
ALBUMIN: 3.3 g/dL — AB (ref 3.5–5.0)
ALT: 19 U/L (ref 14–54)
AST: 52 U/L — AB (ref 15–41)
Alkaline Phosphatase: 65 U/L (ref 38–126)
Anion gap: 25 — ABNORMAL HIGH (ref 5–15)
BUN: 46 mg/dL — AB (ref 6–20)
CHLORIDE: 93 mmol/L — AB (ref 101–111)
CO2: 10 mmol/L — AB (ref 22–32)
CREATININE: 2.61 mg/dL — AB (ref 0.44–1.00)
Calcium: 8.5 mg/dL — ABNORMAL LOW (ref 8.9–10.3)
GFR calc Af Amer: 18 mL/min — ABNORMAL LOW (ref >60)
GFR, EST NON AFRICAN AMERICAN: 16 mL/min — AB (ref >60)
GLUCOSE: 856 mg/dL — AB (ref 65–99)
Potassium: 6.5 mmol/L (ref 3.5–5.1)
Sodium: 128 mmol/L — ABNORMAL LOW (ref 135–145)
Total Bilirubin: 1.4 mg/dL — ABNORMAL HIGH (ref 0.3–1.2)
Total Protein: 6.1 g/dL — ABNORMAL LOW (ref 6.5–8.1)

## 2016-06-08 LAB — CBC WITH DIFFERENTIAL/PLATELET
Basophils Absolute: 0.1 10*3/uL (ref 0–0.1)
Basophils Relative: 0 %
EOS ABS: 0 10*3/uL (ref 0–0.7)
EOS PCT: 0 %
HCT: 36 % (ref 35.0–47.0)
Hemoglobin: 10.8 g/dL — ABNORMAL LOW (ref 12.0–16.0)
LYMPHS ABS: 1.6 10*3/uL (ref 1.0–3.6)
Lymphocytes Relative: 14 %
MCH: 31.4 pg (ref 26.0–34.0)
MCHC: 30.1 g/dL — AB (ref 32.0–36.0)
MCV: 104 fL — AB (ref 80.0–100.0)
MONO ABS: 0.4 10*3/uL (ref 0.2–0.9)
MONOS PCT: 3 %
Neutro Abs: 9.5 10*3/uL — ABNORMAL HIGH (ref 1.4–6.5)
Neutrophils Relative %: 83 %
PLATELETS: 183 10*3/uL (ref 150–440)
RBC: 3.46 MIL/uL — ABNORMAL LOW (ref 3.80–5.20)
RDW: 16.7 % — ABNORMAL HIGH (ref 11.5–14.5)
WBC: 11.5 10*3/uL — AB (ref 3.6–11.0)

## 2016-06-08 LAB — GLUCOSE, CAPILLARY
GLUCOSE-CAPILLARY: 463 mg/dL — AB (ref 65–99)
GLUCOSE-CAPILLARY: 355 mg/dL — AB (ref 65–99)
GLUCOSE-CAPILLARY: 103 mg/dL — AB (ref 65–99)
Glucose-Capillary: >600 mg/dL (ref 65–99)
Glucose-Capillary: 561 mg/dL (ref 65–99)
Glucose-Capillary: 421 mg/dL — ABNORMAL HIGH (ref 65–99)
Glucose-Capillary: 303 mg/dL — ABNORMAL HIGH (ref 65–99)
Glucose-Capillary: 239 mg/dL — ABNORMAL HIGH (ref 65–99)
Glucose-Capillary: 157 mg/dL — ABNORMAL HIGH (ref 65–99)
Glucose-Capillary: 67 mg/dL (ref 65–99)
Glucose-Capillary: 94 mg/dL (ref 65–99)
Glucose-Capillary: 108 mg/dL — ABNORMAL HIGH (ref 65–99)
Glucose-Capillary: 126 mg/dL — ABNORMAL HIGH (ref 65–99)

## 2016-06-08 LAB — URINALYSIS COMPLETE WITH MICROSCOPIC (ARMC ONLY)
BACTERIA UA: NONE SEEN
BILIRUBIN URINE: NEGATIVE
Glucose, UA: >500 mg/dL — AB
Hgb urine dipstick: NEGATIVE
NITRITE: NEGATIVE
PH: 5 (ref 5.0–8.0)
PROTEIN: 100 mg/dL — AB
Specific Gravity, Urine: 1.02 (ref 1.005–1.030)

## 2016-06-08 LAB — MAGNESIUM: Magnesium: 2 mg/dL (ref 1.7–2.4)

## 2016-06-08 LAB — BASIC METABOLIC PANEL
Anion gap: 14 (ref 5–15)
BUN: 43 mg/dL — ABNORMAL HIGH (ref 6–20)
CO2: 18 mmol/L — ABNORMAL LOW (ref 22–32)
Calcium: 9.5 mg/dL (ref 8.9–10.3)
Chloride: 104 mmol/L (ref 101–111)
Creatinine, Ser: 2.48 mg/dL — ABNORMAL HIGH (ref 0.44–1.00)
GFR calc Af Amer: 19 mL/min — ABNORMAL LOW (ref >60)
GFR calc non Af Amer: 17 mL/min — ABNORMAL LOW (ref >60)
Glucose, Bld: 463 mg/dL — ABNORMAL HIGH (ref 65–99)
Potassium: 4 mmol/L (ref 3.5–5.1)
Sodium: 136 mmol/L (ref 135–145)

## 2016-06-08 LAB — URINE CULTURE

## 2016-06-08 LAB — LACTIC ACID, PLASMA
LACTIC ACID, VENOUS: 9.7 mmol/L — AB (ref 0.5–1.9)
LACTIC ACID, VENOUS: 1.6 mmol/L (ref 0.5–1.9)
Lactic Acid, Venous: 5.6 mmol/L (ref 0.5–1.9)

## 2016-06-08 LAB — MRSA PCR SCREENING: MRSA by PCR: NEGATIVE

## 2016-06-08 MED ORDER — ORAL CARE MOUTH RINSE
15.0000 mL | Freq: Two times a day (BID) | OROMUCOSAL | Status: DC
  Administered 2016-06-08 – 2016-06-15 (×12): 15 mL via OROMUCOSAL

## 2016-06-08 MED ORDER — DEXTROSE-NACL 5-0.45 % IV SOLN
INTRAVENOUS | Status: DC
  Administered 2016-06-08: 19:00:00 via INTRAVENOUS

## 2016-06-08 MED ORDER — INSULIN GLARGINE 100 UNIT/ML ~~LOC~~ SOLN
10.0000 [IU] | SUBCUTANEOUS | Status: DC
  Administered 2016-06-08: 10 [IU] via SUBCUTANEOUS
  Filled 2016-06-08: qty 0.1

## 2016-06-08 MED ORDER — LABETALOL HCL 5 MG/ML IV SOLN
10.0000 mg | INTRAVENOUS | Status: DC | PRN
  Administered 2016-06-09 – 2016-06-10 (×4): 20 mg via INTRAVENOUS
  Filled 2016-06-08 (×7): qty 4

## 2016-06-08 MED ORDER — SODIUM BICARBONATE 8.4 % IV SOLN
50.0000 meq | Freq: Once | INTRAVENOUS | Status: AC
  Administered 2016-06-08: 50 meq via INTRAVENOUS
  Filled 2016-06-08: qty 50

## 2016-06-08 MED ORDER — LABETALOL HCL 5 MG/ML IV SOLN
10.0000 mg | INTRAVENOUS | Status: DC | PRN
  Administered 2016-06-08: 10 mg via INTRAVENOUS
  Filled 2016-06-08: qty 4

## 2016-06-08 MED ORDER — SODIUM CHLORIDE 0.9 % IV SOLN
INTRAVENOUS | Status: DC
  Administered 2016-06-08: 10 [IU]/h via INTRAVENOUS
  Administered 2016-06-08: 5.8 [IU]/h via INTRAVENOUS
  Filled 2016-06-08: qty 2.5

## 2016-06-08 MED ORDER — ENOXAPARIN SODIUM 40 MG/0.4ML ~~LOC~~ SOLN
30.0000 mg | SUBCUTANEOUS | Status: DC
  Administered 2016-06-08 – 2016-06-09 (×2): 30 mg via SUBCUTANEOUS
  Filled 2016-06-08 (×3): qty 0.4

## 2016-06-08 MED ORDER — INSULIN ASPART 100 UNIT/ML ~~LOC~~ SOLN
10.0000 [IU] | Freq: Once | SUBCUTANEOUS | Status: AC
  Administered 2016-06-08: 10 [IU] via INTRAVENOUS
  Filled 2016-06-08: qty 10

## 2016-06-08 MED ORDER — SODIUM CHLORIDE 0.9 % IV SOLN: 1.0000 g | Freq: Once | INTRAVENOUS | Status: DC

## 2016-06-08 MED ORDER — SODIUM CHLORIDE 0.9 % IV SOLN: 250.0000 mL | INTRAVENOUS | Status: DC | PRN

## 2016-06-08 MED ORDER — LABETALOL HCL 5 MG/ML IV SOLN: 10.0000 mg | INTRAVENOUS | Status: DC | PRN

## 2016-06-08 MED ORDER — SODIUM CHLORIDE 0.9 % IV BOLUS (SEPSIS)
250.0000 mL | Freq: Once | INTRAVENOUS | Status: AC
  Administered 2016-06-08: 250 mL via INTRAVENOUS

## 2016-06-08 MED ORDER — SODIUM CHLORIDE 0.9 % IV BOLUS (SEPSIS): 1000.0000 mL | Freq: Once | INTRAVENOUS | Status: DC

## 2016-06-08 MED ORDER — SODIUM CHLORIDE 0.9% FLUSH: 3.0000 mL | INTRAVENOUS | Status: DC | PRN

## 2016-06-08 MED ORDER — VANCOMYCIN HCL IN DEXTROSE 1-5 GM/200ML-% IV SOLN
1000.0000 mg | Freq: Once | INTRAVENOUS | Status: AC
  Administered 2016-06-08: 1000 mg via INTRAVENOUS

## 2016-06-08 MED ORDER — AMLODIPINE BESYLATE 5 MG PO TABS
2.5000 mg | ORAL_TABLET | Freq: Every day | ORAL | Status: DC
  Administered 2016-06-10: 2.5 mg via ORAL
  Filled 2016-06-08: qty 1

## 2016-06-08 MED ORDER — SODIUM CHLORIDE 0.9 % IV BOLUS (SEPSIS)
1000.0000 mL | Freq: Once | INTRAVENOUS | Status: AC
  Administered 2016-06-08: 1000 mL via INTRAVENOUS

## 2016-06-08 MED ORDER — ONDANSETRON HCL 4 MG/2ML IJ SOLN: 4.0000 mg | Freq: Four times a day (QID) | INTRAMUSCULAR | Status: DC | PRN

## 2016-06-08 MED ORDER — DEXTROSE 50 % IV SOLN
INTRAVENOUS | Status: AC
  Administered 2016-06-08: 13 mL
  Filled 2016-06-08: qty 50

## 2016-06-08 MED ORDER — VANCOMYCIN HCL IN DEXTROSE 1-5 GM/200ML-% IV SOLN
INTRAVENOUS | Status: AC
  Filled 2016-06-08: qty 200

## 2016-06-08 MED ORDER — CALCIUM CHLORIDE 10 % IV SOLN
1.0000 g | Freq: Once | INTRAVENOUS | Status: AC
  Administered 2016-06-08: 1 g via INTRAVENOUS
  Filled 2016-06-08: qty 10

## 2016-06-08 MED ORDER — INSULIN ASPART 100 UNIT/ML ~~LOC~~ SOLN
1.0000 [IU] | SUBCUTANEOUS | Status: DC
  Administered 2016-06-09: 3 [IU] via SUBCUTANEOUS
  Filled 2016-06-08: qty 4
  Filled 2016-06-08: qty 3

## 2016-06-08 MED ORDER — SODIUM CHLORIDE 0.9% FLUSH
3.0000 mL | Freq: Two times a day (BID) | INTRAVENOUS | Status: DC
  Administered 2016-06-08 – 2016-06-11 (×6): 3 mL via INTRAVENOUS

## 2016-06-08 MED ORDER — DEXTROSE 10 % IV SOLN: INTRAVENOUS | Status: DC | PRN

## 2016-06-08 MED ORDER — ACETAMINOPHEN 325 MG PO TABS
650.0000 mg | ORAL_TABLET | ORAL | Status: DC | PRN
  Administered 2016-06-12 – 2016-06-15 (×3): 650 mg via ORAL
  Filled 2016-06-08 (×3): qty 2

## 2016-06-08 MED ORDER — HYDRALAZINE HCL 50 MG PO TABS: 50.0000 mg | ORAL_TABLET | Freq: Two times a day (BID) | ORAL | Status: DC

## 2016-06-08 MED ORDER — PIPERACILLIN-TAZOBACTAM 3.375 G IVPB
3.3750 g | Freq: Once | INTRAVENOUS | Status: AC
  Administered 2016-06-08: 3.375 g via INTRAVENOUS

## 2016-06-08 MED ORDER — PIPERACILLIN-TAZOBACTAM 3.375 G IVPB
INTRAVENOUS | Status: AC
  Filled 2016-06-08: qty 50

## 2016-06-08 MED ORDER — FAMOTIDINE IN NACL 20-0.9 MG/50ML-% IV SOLN
20.0000 mg | Freq: Two times a day (BID) | INTRAVENOUS | Status: DC
Start: 2016-06-08 — End: 2016-06-09
  Administered 2016-06-08 (×2): 20 mg via INTRAVENOUS
  Filled 2016-06-08 (×2): qty 50

## 2016-06-08 MED ORDER — CEFTRIAXONE SODIUM IN DEXTROSE 40 MG/ML IV SOLN
2.0000 g | INTRAVENOUS | Status: DC
  Administered 2016-06-08 – 2016-06-12 (×5): 2 g via INTRAVENOUS
  Filled 2016-06-08 (×6): qty 50

## 2016-06-08 MED ORDER — METOPROLOL TARTRATE 5 MG/5ML IV SOLN
5.0000 mg | Freq: Once | INTRAVENOUS | Status: AC
  Administered 2016-06-08: 5 mg via INTRAVENOUS
  Filled 2016-06-08: qty 5

## 2016-06-08 NOTE — ED Notes (Signed)
ICU provider returned call and gave order for stat EKG and she would come to look at pt

## 2016-06-08 NOTE — ED Notes (Signed)
FSBS 355 - per glucose stabilizer changed insulin rate to 11.8

## 2016-06-08 NOTE — ED Notes (Signed)
Pt had a 30 sec run of what appeared to be v-tach - she then converted back to what appeared to be a-fib at a rate of 150's-160's - ICU provider paged

## 2016-06-08 NOTE — ED Notes (Signed)
ED Provider at bedside. 

## 2016-06-08 NOTE — ED Notes (Signed)
fsbs 303 - per glucose stabilizer increased insulin drip to 12.2

## 2016-06-08 NOTE — Progress Notes (Signed)
Chaplain responded to a consult from a nurse. Family members made a request to see a Chaplain because they were concerned with the situation of their loved one. When Chaplain arrived there he met with the sister of the patient and her husband. They said that Dr. Owens Shark prayed with them and they appreciated that very much. Chaplain told them to reach out to him any time they needed to see him.

## 2016-06-08 NOTE — ED Notes (Signed)
FSBS 421 - per glucose stabilizer insulin rate changed to 10.8 - verified by Lenise ArenaKate RN

## 2016-06-08 NOTE — ED Notes (Signed)
ICU provider with pt - ordered K+ and Mag level and will be placing cardizem - Dr Shaune PollackLord aware that pt will not have ICU bed for approx one hour

## 2016-06-08 NOTE — ED Notes (Signed)
Foley inserted

## 2016-06-08 NOTE — ED Notes (Signed)
E-Link calling to update and remind of lactic acid that is needed

## 2016-06-08 NOTE — Progress Notes (Signed)
[facility]'s insulin gtt protocol, notified of previous fsbs and current gtt rate of 2.1, also notified of patient's hypertensive requesting PRN bp meds.  MD states "I'll take a look at everything and put in some orders for you"

## 2016-06-08 NOTE — ED Notes (Signed)
Family questioning why pt has not been moved to the ICU - secretary checked on rooming status and was told they were about to swap the room over to a ready status

## 2016-06-08 NOTE — Progress Notes (Signed)
eLink Physician-Brief Progress Note Patient Name: Lynnell DikeMary Slone DOB: 09-Oct-1926 MRN: [id number]   Date of Service  06/08/2016  HPI/Events of Note  Blood sugar improved. Blood pressure up.   eICU Interventions  Transition to hyperglycemia 3 protocol.  Add prn labetalol.      Intervention Category Major Interventions: Other:  Arihaan Bellucci 06/08/2016, 6:22 PM

## 2016-06-08 NOTE — ED Provider Notes (Signed)
[facility] Emergency [facility] Provider Note   First MD Initiated Contact with Patient 06/08/16 [phone]     (approximate)  I have reviewed the triage vital signs and the nursing notes.  History Limited secondary to altered mental status. HISTORY  Chief Complaint Code Sepsis   HPI Julie Burch is a 82 y.o. female presents via EMS with decreased level of consciousness with concern for possible sepsis. Per EMS on their arrival patient noted to be in a junctional rhythm with a heart rate of 46 hypotensive with a systolic blood pressure 70. In addition EMS noted that the patient's glucose read high on the glucometer. Review of the patient's sister revealed that she was recently seen in the emergency department second or 2 urinary tract infection. Further review of the patient's chart revealed multiple visits for urinary tract infection as well as a recent visit for a fall with a negative CT scan of the head performed at that time.   Past Medical History:  Diagnosis Date  . Depression   . Diabetes mellitus without complication (HCC)   . GERD (gastroesophageal reflux disease)   . Hypertension   . Recurrent UTI   . Stroke [facility](HCC)    TIA    Patient Active Problem List   Diagnosis Date Noted  . Recurrent UTI 02/09/2016  . GERD (gastroesophageal reflux disease) 02/09/2016  . HTN (hypertension) 02/09/2016  . Hypothyroidism 02/09/2016  . Depression 02/09/2016  . Acute on chronic kidney failure (HCC) 02/09/2016  . Diabetes (HCC) 02/09/2016    Past Surgical History:  Procedure Laterality Date  . TONSILLECTOMY      Prior to Admission medications   Medication Sig Start Date End Date Taking? Authorizing Provider  acetaminophen (TYLENOL) 500 MG tablet Take 1,000 mg by mouth every 4 (four) hours as needed for mild pain or headache.    Historical Provider, MD  amLODipine (NORVASC) 2.5 MG tablet Take 2.5 mg by mouth daily.    Historical Provider, MD  cephALEXin  (KEFLEX) 500 MG capsule Take 1 capsule (500 mg total) by mouth 3 (three) times daily. 06/05/16 06/15/16  Jeanmarie PlantJames A McShane, MD  cholecalciferol (VITAMIN D) 1000 units tablet Take 2,000 Units by mouth daily.    Historical Provider, MD  Coenzyme Q10 (COQ-10) 100 MG CAPS Take 100 mg by mouth daily.    Historical Provider, MD  donepezil (ARICEPT) 5 MG tablet Take 5 mg by mouth at bedtime.    Historical Provider, MD  esomeprazole (NEXIUM) 40 MG capsule Take 40 mg by mouth daily before breakfast.    Historical Provider, MD  fosfomycin (MONUROL) 3 g PACK Take 3 g by mouth See admin instructions. Pt is to take once every ten days.    Historical Provider, MD  glucose 4 GM chewable tablet Chew 4 tablets by mouth as needed (for blood sugar less than 80).    Historical Provider, MD  hydrALAZINE (APRESOLINE) 50 MG tablet Take 50 mg by mouth 2 (two) times daily.    Historical Provider, MD  insulin glargine (LANTUS) 100 UNIT/ML injection Inject 17 Units into the skin at bedtime.     Historical Provider, MD  insulin lispro (HUMALOG) 100 UNIT/ML injection Inject 3-5 Units into the skin 3 (three) times daily with meals. Pt injects 5 units before breakfast at 0900, 5 units before lunch at 1130, and 3 units before dinner at 1700.    Historical Provider, MD  levothyroxine (SYNTHROID, LEVOTHROID) 50 MCG tablet Take 50 mcg by mouth daily before  breakfast.    Historical Provider, MD  losartan (COZAAR) 25 MG tablet Take 25 mg by mouth daily.    Historical Provider, MD  ondansetron (ZOFRAN) 4 MG tablet Take 4 mg by mouth every 8 (eight) hours as needed for nausea or vomiting.    Historical Provider, MD  QUEtiapine (SEROQUEL) 50 MG tablet Take 150 mg by mouth at bedtime.     Historical Provider, MD  sertraline (ZOLOFT) 100 MG tablet Take 150 mg by mouth daily.    Historical Provider, MD  vitamin B-12 (CYANOCOBALAMIN) 1000 MCG tablet Take 1,000 mcg by mouth daily.    Historical Provider, MD    Allergies Aspirin; Ciprofloxacin;  Diovan [valsartan]; Fosamax [alendronate sodium]; and Sulfa antibiotics  No family history on file.  Social History Social History  Substance Use Topics  . Smoking status: Former Games developer  . Smokeless tobacco: Never Used  . Alcohol use No    Review of Systems Constitutional: No fever/chills Eyes: No visual changes. ENT: No sore throat. Cardiovascular: Denies chest pain. Respiratory: Denies shortness of breath. Gastrointestinal: No abdominal pain.  No nausea, no vomiting.  No diarrhea.  No constipation. Genitourinary: Negative for dysuria. Musculoskeletal: Negative for back pain. Skin: Negative for rash. Neurological: Positive for decreased level of consciousness.  10-point ROS otherwise negative.  ____________________________________________   PHYSICAL EXAM:  VITAL SIGNS: ED Triage Vitals  Enc Vitals Group     BP 06/08/16 0635 (!) 116/59     Pulse Rate 06/08/16 0635 92     Resp --      Temp --      Temp src --      SpO2 06/08/16 0635 100 %     Weight 06/08/16 0644 113 lb 8.6 oz (51.5 kg)     Height --      Head Circumference --      Peak Flow --      Pain Score --      Pain Loc --      Pain Edu? --      Excl. in GC? --     Constitutional: Responds to verbal stimuli Eyes: Conjunctivae are normal. PERRL. EOMI. Head: Atraumatic. Ears:  Healthy appearing ear canals and TMs bilaterally Nose: No congestion/rhinnorhea. Mouth/Throat: Mucous membranes are moist.  Oropharynx non-erythematous. Positive or gag reflex Neck: No stridor.  No meningeal signs.  No cervical spine tenderness to palpation. Cardiovascular: Normal rate, regular rhythm. Good peripheral circulation. Grossly normal heart sounds. Respiratory: Normal respiratory effort.  No retractions. Lungs CTAB. Gastrointestinal: Soft and nontender. No distention.  Musculoskeletal: No lower extremity tenderness nor edema. No gross deformities of extremities. Neurologic:  Nonverbal, draws to noxious stimuli all  extremities Skin:  Right frontal temporal ecchymoses with central abrasion Psychiatric: Mood and affect are normal. Speech and behavior are normal.  ____________________________________________   LABS (all labs ordered are listed, but only abnormal results are displayed)  Labs Reviewed  COMPREHENSIVE METABOLIC PANEL - Abnormal; Notable for the following:       Result Value   Sodium 128 (*)    Potassium 6.5 (*)    Chloride 93 (*)    CO2 10 (*)    Glucose, Bld 856 (*)    BUN 46 (*)    Creatinine, Ser 2.61 (*)    Calcium 8.5 (*)    Total Protein 6.1 (*)    Albumin 3.3 (*)    AST 52 (*)    Total Bilirubin 1.4 (*)    GFR calc non Af [place]  16 (*)    GFR calc Af Amer 18 (*)    Anion gap 25 (*)    All other components within normal limits  CBC WITH DIFFERENTIAL/PLATELET - Abnormal; Notable for the following:    WBC 11.5 (*)    RBC 3.46 (*)    Hemoglobin 10.8 (*)    MCV 104.0 (*)    MCHC 30.1 (*)    RDW 16.7 (*)    Neutro Abs 9.5 (*)    All other components within normal limits  LACTIC ACID, PLASMA - Abnormal; Notable for the following:    Lactic Acid, Venous 9.7 (*)    All other components within normal limits  URINALYSIS COMPLETEWITH MICROSCOPIC ([facility] ONLY) - Abnormal; Notable for the following:    Color, Urine YELLOW (*)    APPearance HAZY (*)    Glucose, UA >500 (*)    Ketones, ur TRACE (*)    Protein, ur 100 (*)    Leukocytes, UA 1+ (*)    Squamous Epithelial / LPF 0-5 (*)    All other components within normal limits  CULTURE, BLOOD (ROUTINE X 2)  CULTURE, BLOOD (ROUTINE X 2)  URINE CULTURE  LACTIC ACID, PLASMA   ____________________________________________  EKG  ED ECG REPORT I, Cecil N Deaken Jurgens, the attending physician, personally viewed and interpreted this ECG.   Date: 06/08/2016  EKG Time: 6:34 AM  Rate: 93  Rhythm: Normal sinus rhythm  Axis: Normal  Intervals: Normal  ST&T Change:  None  ____________________________________________  RADIOLOGY I, [place] Carrolyn Hilmes, personally viewed and evaluated these images (plain radiographs) as part of my medical decision making, as well as reviewing the written report by the radiologist.  No results found.    Procedures      INITIAL IMPRESSION / ASSESSMENT AND PLAN / ED COURSE  Pertinent labs & imaging results that were available during my care of the patient were reviewed by me and considered in my medical decision making (see chart for details).  Concern for sepsis and possible DKA patient received 30 ML's per kilogram of IV normal saline as well as IV vancomycin and Zosyn. Patient given 10 units insulin IV bolus followed by insulin infusion. Patient discussed with Dr. Kathrin Penner intensivist on call for hospital admission for further evaluation and management of sepsis DKA and hyperkalemia    Clinical Course     ____________________________________________  FINAL CLINICAL IMPRESSION(S) / ED DIAGNOSES  Final diagnoses:  Sepsis, due to unspecified organism [facility])  Diabetic ketoacidosis without coma associated with type 2 diabetes mellitus (HCC)  Hyperkalemia   MEDICATIONS GIVEN DURING THIS VISIT:  Medications  sodium chloride 0.9 % bolus 250 mL (not administered)  vancomycin (VANCOCIN) IVPB 1000 mg/200 mL premix (1,000 mg Intravenous New Bag/Given 06/08/16 0658)  piperacillin-tazobactam (ZOSYN) IVPB 3.375 g (3.375 g Intravenous New Bag/Given 06/08/16 0649)  insulin aspart (novoLOG) injection 10 Units (not administered)  insulin regular (NOVOLIN R,HUMULIN R) 250 Units in sodium chloride 0.9 % 250 mL (1 Units/mL) infusion (not administered)  calcium gluconate 1 g in sodium chloride 0.9 % 100 mL IVPB (not administered)  sodium bicarbonate injection 50 mEq (not administered)  sodium chloride 0.9 % bolus 1,000 mL (1,000 mLs Intravenous New Bag/Given 06/08/16 0643)  sodium chloride 0.9 % bolus 1,000 mL (1,000 mLs  Intravenous New Bag/Given 06/08/16 5409)     NEW OUTPATIENT MEDICATIONS STARTED DURING THIS VISIT:  New Prescriptions   No medications on file    Modified Medications   No medications on file  Discontinued Medications   No medications on file     Note:  This document was prepared using Dragon voice recognition software and may include unintentional dictation errors.    Darci Currentandolph N Jenesis Suchy, MD 06/09/16 [phone]

## 2016-06-08 NOTE — H&P (Signed)
PULMONARY / CRITICAL CARE MEDICINE   Name: Julie Burch MRN: [id number] DOB: May 16, 1927    ADMISSION DATE:  06/08/2016   CHIEF COMPLAINT:    INITIAL PRESENTATION: mental status changes     HISTORY OF PRESENT ILLNESS:  82 yo white female seen in ER for septic shock Patient was hypotensive, elevated FSBS. Unresponsive, increased WOB Family at bedside Was seen 2 days ago for UTI.  Patient was given 2 l IVF, placed on onisulin infusion Patient remains obtunded LA 9   PAST MEDICAL HISTORY :   has a past medical history of Depression; Diabetes mellitus without complication (HCC); GERD (gastroesophageal reflux disease); Hypertension; Recurrent UTI; and Stroke (HCC).  has a past surgical history that includes Tonsillectomy. Prior to Admission medications   Medication Sig Start Date End Date Taking? Authorizing Provider  acetaminophen (TYLENOL) 500 MG tablet Take 1,000 mg by mouth every 4 (four) hours as needed for mild pain or headache.   Yes Historical Provider, MD  amLODipine (NORVASC) 2.5 MG tablet Take 2.5 mg by mouth daily.   Yes Historical Provider, MD  cephALEXin (KEFLEX) 500 MG capsule Take 1 capsule (500 mg total) by mouth 3 (three) times daily. 06/05/16 06/15/16 Yes Jeanmarie Plant, MD  cholecalciferol (VITAMIN D) 1000 units tablet Take 2,000 Units by mouth daily.   Yes Historical Provider, MD  Coenzyme Q10 (COQ-10) 100 MG CAPS Take 100 mg by mouth daily.   Yes Historical Provider, MD  donepezil (ARICEPT) 5 MG tablet Take 5 mg by mouth at bedtime.   Yes Historical Provider, MD  esomeprazole (NEXIUM) 40 MG capsule Take 40 mg by mouth daily before breakfast.   Yes Historical Provider, MD  fosfomycin (MONUROL) 3 g PACK Take 3 g by mouth See admin instructions. Pt is to take once every ten days.   Yes Historical Provider, MD  glucose 4 GM chewable tablet Chew 4 tablets by mouth as needed (for blood sugar less than 80).   Yes Historical Provider, MD  hydrALAZINE (APRESOLINE) 50 MG  tablet Take 50 mg by mouth 2 (two) times daily.   Yes Historical Provider, MD  insulin glargine (LANTUS) 100 UNIT/ML injection Inject 17 Units into the skin at bedtime.    Yes Historical Provider, MD  insulin lispro (HUMALOG) 100 UNIT/ML injection Inject 3-5 Units into the skin 3 (three) times daily with meals. Pt injects 5 units before breakfast at 0900, 5 units before lunch at 1130, and 3 units before dinner at 1700.   Yes Historical Provider, MD  levothyroxine (SYNTHROID, LEVOTHROID) 50 MCG tablet Take 50 mcg by mouth daily before breakfast.   Yes Historical Provider, MD  losartan (COZAAR) 25 MG tablet Take 25 mg by mouth daily.   Yes Historical Provider, MD  ondansetron (ZOFRAN) 4 MG tablet Take 4 mg by mouth every 8 (eight) hours as needed for nausea or vomiting.   Yes Historical Provider, MD  QUEtiapine (SEROQUEL) 50 MG tablet Take 150 mg by mouth at bedtime.    Yes Historical Provider, MD  sertraline (ZOLOFT) 100 MG tablet Take 150 mg by mouth daily.   Yes Historical Provider, MD  vitamin B-12 (CYANOCOBALAMIN) 1000 MCG tablet Take 1,000 mcg by mouth daily.   Yes Historical Provider, MD   Allergies  Allergen Reactions  . Aspirin Other (See Comments)    Reaction:  Intracranial hemorrhage   . Ciprofloxacin Other (See Comments)    Reaction:  Hallucinations/severe agitation    . Diovan [Valsartan] Other (See Comments)    Reaction:  High potassium levels   . Fosamax [Alendronate Sodium] Other (See Comments)    Reaction:  GI upset   . Sulfa Antibiotics Hives    FAMILY HISTORY:  has no family status information on file.   SOCIAL HISTORY:  reports that she has quit smoking. She has never used smokeless tobacco. She reports that she does not drink alcohol or use drugs.  REVIEW OF SYSTEMS:   Unobtainable due to critical illness    VITAL SIGNS: Temp:  [95.6 F (35.3 C)-95.7 F (35.4 C)] 95.6 F (35.3 C) (11/30 0900) Pulse Rate:  [92-127] 126 (11/30 0900) Resp:  [17-36] 17 (11/30  0900) BP: (92-129)/(42-59) 129/48 (11/30 0900) SpO2:  [95 %-100 %] 95 % (11/30 0900) Weight:  [113 lb 8.6 oz (51.5 kg)] 113 lb 8.6 oz (51.5 kg) (11/30 0644) HEMODYNAMICS:     Intake/Output Summary (Last 24 hours) at 06/08/16 0930 Last data filed at 06/08/16 0814  Gross per 24 hour  Intake             2450 ml  Output                0 ml  Net             2450 ml    PHYSICAL EXAMINATION: Physical Examination:   GENERAL:critically ill appearing, +resp distress HEAD: Normocephalic, atraumatic.  EYES: Pupils equal, round, reactive to light.  No scleral icterus.  MOUTH: Moist mucosal membrane. NECK: Supple. No thyromegaly. No nodules. No JVD.  PULMONARY: +rhonchi CARDIOVASCULAR: S1 and S2. Regular rate and rhythm. No murmurs, rubs, or gallops.  GASTROINTESTINAL: Soft, nontender, +distended. No masses. Positive bowel sounds. No hepatosplenomegaly.  MUSCULOSKELETAL: No swelling, clubbing, or edema.  NEUROLOGIC:obtunded SKIN:intact,warm,dry    LABS:  CBC  Recent Labs Lab 06/05/16 1728 06/08/16 0638  WBC 6.4 11.5*  HGB 12.5 10.8*  HCT 37.6 36.0  PLT 167 183   Coag's No results for input(s): APTT, INR in the last 168 hours. BMET  Recent Labs Lab 06/05/16 1728 06/08/16 0638  NA 137 128*  K 5.7* 6.5*  CL 104 93*  CO2 24 10*  BUN 25* 46*  CREATININE 1.34* 2.61*  GLUCOSE 62* 856*   Electrolytes  Recent Labs Lab 06/05/16 1728 06/08/16 0638  CALCIUM 9.0 8.5*   Sepsis Markers  Recent Labs Lab 06/08/16 0638  LATICACIDVEN 9.7*   ABG No results for input(s): PHART, PCO2ART, PO2ART in the last 168 hours. Liver Enzymes  Recent Labs Lab 06/08/16 0638  AST 52*  ALT 19  ALKPHOS 65  BILITOT 1.4*  ALBUMIN 3.3*   Cardiac Enzymes  Recent Labs Lab 06/05/16 1728  TROPONINI 0.03*   Glucose  Recent Labs Lab 06/05/16 1850 06/08/16 0852  GLUCAP 99 >600*    Imaging Dg Chest Portable 1 View  Result Date: 06/08/2016 CLINICAL DATA:  Unresponsive.   Recent fall . EXAM: PORTABLE CHEST 1 VIEW COMPARISON:  05/06/2016 . FINDINGS: Cardiomegaly with normal pulmonary vascularity. Low lung volumes. No pleural effusion or pneumothorax . Calcified pulmonary nodules consistent with granulomas. No acute bony abnormality . IMPRESSION: 1.  Cardiomegaly with normal pulmonary vascularity. 2. Low lung volumes. Electronically Signed   By: Maisie Fushomas  Register   On: 06/08/2016 08:16     ASSESSMENT / PLAN:   82 yo white female with severe sepsis and septic shock from UTI with acute renal failure and severe metabolic acidosis and encephalopathy   PULMONARY Oxygen as needed -high risk for intubation  CARDIOVASCULAR Septic shock -IVF's Sepsis  protocol Will assess for CLV  RENAL ARF from ATN -follow chem 7 Elevated k -repeat Chem 7 pending -place foley  GASTROINTESTINAL Keep NPO  HEMATOLOGIC Follow cbc  INFECTIOUS Follow up cultures Rocephin for abx    ENDOCRINE Insulin infusion  NEUROLOGIC Avoid sedatives   I have personally obtained a history, examined the patient, evaluated Pertinent laboratory and RadioGraphic/imaging results, and  formulated the assessment and plan   The Patient requires high complexity decision making for assessment and support, frequent evaluation and titration of therapies, application of advanced monitoring technologies and extensive interpretation of multiple databases. Critical Care Time devoted to patient care services described in this note is 50 minutes.   Overall, patient is critically ill, prognosis is guarded.  Patient with Multiorgan failure and at high risk for cardiac arrest and death.    Lucie LeatherKurian David Nevia Henkin, M.D.  [facility]  Medical Director [facility] Medical Director [facility] Cardio-Pulmonary Department

## 2016-06-08 NOTE — Progress Notes (Signed)
   Spoke to daughter(Sandra) who is HPOA for the patient.  Daughter states that her mother would not want to be on ventilator and receive chest compression or any other heroic measures to bring her back to life .  Code status was changed to DNR.   Eliska Hamil,AG-ACNP Pulmonary & Critical Care

## 2016-06-08 NOTE — ED Triage Notes (Addendum)
[facility] and took BS and was reading high and given insulin. BP was 70/30. HR was 46. Had recent fall.

## 2016-06-08 NOTE — Progress Notes (Signed)
Pharmacy Antibiotic Note  Julie Burch is a 82 y.o. female admitted on 06/08/2016 with sepsis.  Pharmacy has been consulted for ceftriaxone dosing.  Plan: Ceftriaxone 2 g iv q 24 hours.   Height: 5\' 4"  (162.6 cm) Weight: 113 lb 8.6 oz (51.5 kg) IBW/kg (Calculated) : 54.7  Temp (24hrs), Avg:98.1 F (36.7 C), Min:95.6 F (35.3 C), Max:98.9 F (37.2 C)   Recent Labs Lab 06/05/16 1728 06/08/16 0638 06/08/16 0948 06/08/16 1237  WBC 6.4 11.5*  --   --   CREATININE 1.34* 2.61*  --  2.48*  LATICACIDVEN  --  9.7* 5.6*  --     Estimated Creatinine Clearance: 13.2 mL/min (by C-G formula based on SCr of 2.48 mg/dL (H)).    Allergies  Allergen Reactions  . Aspirin Other (See Comments)    Reaction:  Intracranial hemorrhage   . Ciprofloxacin Other (See Comments)    Reaction:  Hallucinations/severe agitation    . Diovan [Valsartan] Other (See Comments)    Reaction:  High potassium levels   . Fosamax [Alendronate Sodium] Other (See Comments)    Reaction:  GI upset   . Sulfa Antibiotics Hives    Antimicrobials this admission: Vancomycin and Zosyn x 1 Ceftriaxone  11/30 >>   Dose adjustments this admission:  Microbiology results: 11/30 BCx: pending 11/30 UCx: pending    MRSA PCR: pending  Thank you for allowing pharmacy to be a part of this patient's care.  Valentina GuChristy, Psalm Arman D 06/08/2016 6:34 PM

## 2016-06-09 ENCOUNTER — Inpatient Hospital Stay: Payer: Medicare Other

## 2016-06-09 LAB — CBC
HEMATOCRIT: 33.4 % — AB (ref 35.0–47.0)
HEMOGLOBIN: 11 g/dL — AB (ref 12.0–16.0)
MCH: 30.7 pg (ref 26.0–34.0)
MCHC: 33.1 g/dL (ref 32.0–36.0)
MCV: 92.8 fL (ref 80.0–100.0)
Platelets: 117 10*3/uL — ABNORMAL LOW (ref 150–440)
RBC: 3.6 MIL/uL — ABNORMAL LOW (ref 3.80–5.20)
RDW: 16.4 % — ABNORMAL HIGH (ref 11.5–14.5)
WBC: 16.1 10*3/uL — ABNORMAL HIGH (ref 3.6–11.0)

## 2016-06-09 LAB — BASIC METABOLIC PANEL
ANION GAP: 9 (ref 5–15)
BUN: 38 mg/dL — ABNORMAL HIGH (ref 6–20)
CHLORIDE: 107 mmol/L (ref 101–111)
CO2: 21 mmol/L — AB (ref 22–32)
Calcium: 8.9 mg/dL (ref 8.9–10.3)
Creatinine, Ser: 1.69 mg/dL — ABNORMAL HIGH (ref 0.44–1.00)
GFR calc non Af Amer: 26 mL/min — ABNORMAL LOW (ref >60)
GFR, EST AFRICAN AMERICAN: 30 mL/min — AB (ref >60)
GLUCOSE: 276 mg/dL — AB (ref 65–99)
Potassium: 4.9 mmol/L (ref 3.5–5.1)
Sodium: 137 mmol/L (ref 135–145)

## 2016-06-09 LAB — GLUCOSE, CAPILLARY
GLUCOSE-CAPILLARY: 289 mg/dL — AB (ref 65–99)
GLUCOSE-CAPILLARY: 238 mg/dL — AB (ref 65–99)
GLUCOSE-CAPILLARY: 191 mg/dL — AB (ref 65–99)
Glucose-Capillary: 229 mg/dL — ABNORMAL HIGH (ref 65–99)
Glucose-Capillary: 267 mg/dL — ABNORMAL HIGH (ref 65–99)
Glucose-Capillary: 283 mg/dL — ABNORMAL HIGH (ref 65–99)
Glucose-Capillary: 287 mg/dL — ABNORMAL HIGH (ref 65–99)
Glucose-Capillary: 308 mg/dL — ABNORMAL HIGH (ref 65–99)

## 2016-06-09 LAB — URINE CULTURE: CULTURE: NO GROWTH

## 2016-06-09 LAB — LACTIC ACID, PLASMA: Lactic Acid, Venous: 1.8 mmol/L (ref 0.5–1.9)

## 2016-06-09 MED ORDER — ACETAMINOPHEN 80 MG RE SUPP: 80.0000 mg | Freq: Once | RECTAL | Status: DC

## 2016-06-09 MED ORDER — INSULIN ASPART 100 UNIT/ML ~~LOC~~ SOLN
4.0000 [IU] | Freq: Once | SUBCUTANEOUS | Status: AC
  Administered 2016-06-09: 4 [IU] via SUBCUTANEOUS

## 2016-06-09 MED ORDER — FAMOTIDINE IN NACL 20-0.9 MG/50ML-% IV SOLN
20.0000 mg | INTRAVENOUS | Status: DC
Start: 2016-06-10 — End: 2016-06-13
  Administered 2016-06-10 – 2016-06-12 (×2): 20 mg via INTRAVENOUS
  Filled 2016-06-09 (×2): qty 50

## 2016-06-09 MED ORDER — SODIUM CHLORIDE 0.9 % IV SOLN
INTRAVENOUS | Status: DC
  Administered 2016-06-09: 2.2 [IU]/h via INTRAVENOUS
  Filled 2016-06-09: qty 2.5

## 2016-06-09 MED ORDER — SODIUM CHLORIDE 0.9 % IV SOLN
INTRAVENOUS | Status: DC
  Administered 2016-06-09: 50 mL/h via INTRAVENOUS

## 2016-06-09 MED ORDER — INSULIN REGULAR BOLUS VIA INFUSION: 0.0000 [IU] | Freq: Three times a day (TID) | INTRAVENOUS | Status: DC

## 2016-06-09 MED ORDER — ACETAMINOPHEN 650 MG RE SUPP
650.0000 mg | Freq: Four times a day (QID) | RECTAL | Status: DC | PRN
  Administered 2016-06-09 – 2016-06-10 (×2): 650 mg via RECTAL
  Filled 2016-06-09 (×2): qty 1

## 2016-06-09 MED ORDER — INSULIN ASPART 100 UNIT/ML ~~LOC~~ SOLN
0.0000 [IU] | SUBCUTANEOUS | Status: DC
  Administered 2016-06-09: 5 [IU] via SUBCUTANEOUS
  Administered 2016-06-09 (×2): 3 [IU] via SUBCUTANEOUS
  Administered 2016-06-10 (×3): 2 [IU] via SUBCUTANEOUS
  Administered 2016-06-10: 1 [IU] via SUBCUTANEOUS
  Administered 2016-06-10: 2 [IU] via SUBCUTANEOUS
  Administered 2016-06-10: 1 [IU] via SUBCUTANEOUS
  Administered 2016-06-11: 3 [IU] via SUBCUTANEOUS
  Administered 2016-06-11: 1 [IU] via SUBCUTANEOUS
  Administered 2016-06-11: 3 [IU] via SUBCUTANEOUS
  Administered 2016-06-11 – 2016-06-12 (×4): 1 [IU] via SUBCUTANEOUS
  Administered 2016-06-12: 13:00:00 2 [IU] via SUBCUTANEOUS
  Filled 2016-06-09 (×2): qty 2
  Filled 2016-06-09: qty 1
  Filled 2016-06-09: qty 5
  Filled 2016-06-09: qty 2
  Filled 2016-06-09: qty 1
  Filled 2016-06-09: qty 2
  Filled 2016-06-09: qty 1
  Filled 2016-06-09: qty 2
  Filled 2016-06-09 (×3): qty 3
  Filled 2016-06-09 (×2): qty 1
  Filled 2016-06-09: qty 2
  Filled 2016-06-09: qty 1
  Filled 2016-06-09: qty 3

## 2016-06-09 MED ORDER — HYDRALAZINE HCL 20 MG/ML IJ SOLN
10.0000 mg | INTRAMUSCULAR | Status: DC | PRN
  Administered 2016-06-09: 10 mg via INTRAVENOUS
  Filled 2016-06-09: qty 1

## 2016-06-09 MED ORDER — INSULIN GLARGINE 100 UNIT/ML ~~LOC~~ SOLN
10.0000 [IU] | Freq: Every day | SUBCUTANEOUS | Status: DC
  Administered 2016-06-09 – 2016-06-12 (×4): 10 [IU] via SUBCUTANEOUS
  Filled 2016-06-09 (×7): qty 0.1

## 2016-06-09 MED ORDER — DEXTROSE-NACL 5-0.45 % IV SOLN
INTRAVENOUS | Status: DC
  Administered 2016-06-10 – 2016-06-11 (×2): via INTRAVENOUS

## 2016-06-09 MED ORDER — HYDRALAZINE HCL 20 MG/ML IJ SOLN
20.0000 mg | INTRAMUSCULAR | Status: DC | PRN
  Administered 2016-06-09 – 2016-06-14 (×8): 20 mg via INTRAVENOUS
  Filled 2016-06-09 (×9): qty 1

## 2016-06-09 MED ORDER — DEXTROSE 50 % IV SOLN
25.0000 mL | INTRAVENOUS | Status: DC | PRN
  Administered 2016-06-13: 06:00:00 25 mL via INTRAVENOUS
  Filled 2016-06-09: qty 50

## 2016-06-09 NOTE — Progress Notes (Signed)
Pharmacy Antibiotic Note  Julie Burch is a 82 y.o. female admitted on 06/08/2016 with sepsis. Patient with a h/o multiple recent UTIs and recently d/c from ED 11/27 on Keflex for UTI.   Pharmacy has been consulted for ceftriaxone dosing.  Plan: Continue ceftriaxone 2 g iv q 24 hours. Patient with elevated WBC and elevated temp. May consider escalation of abx as patient was apparently not responding to cephalexin. On the other hand, blood sugar is poorly controlled and may be contributing to infectious process and urine culture is negative.   Height: 5\' 4"  (162.6 cm) Weight: 119 lb 7.8 oz (54.2 kg) IBW/kg (Calculated) : 54.7  Temp (24hrs), Avg:100 F (37.8 C), Min:98.5 F (36.9 C), Max:100.4 F (38 C)   Recent Labs Lab 06/05/16 1728 06/08/16 0638 06/08/16 0948 06/08/16 1237 06/08/16 2123 06/09/16 0015 06/09/16 0357  WBC 6.4 11.5*  --   --   --   --  16.1*  CREATININE 1.34* 2.61*  --  2.48*  --   --  1.69*  LATICACIDVEN  --  9.7* 5.6*  --  1.6 1.8  --     Estimated Creatinine Clearance: 20.4 mL/min (by C-G formula based on SCr of 1.69 mg/dL (H)).    Allergies  Allergen Reactions  . Aspirin Other (See Comments)    Reaction:  Intracranial hemorrhage   . Ciprofloxacin Other (See Comments)    Reaction:  Hallucinations/severe agitation    . Diovan [Valsartan] Other (See Comments)    Reaction:  High potassium levels   . Fosamax [Alendronate Sodium] Other (See Comments)    Reaction:  GI upset   . Sulfa Antibiotics Hives    Antimicrobials this admission: Vancomycin and Zosyn x 1 Ceftriaxone  11/30 >>   Dose adjustments this admission:  Microbiology results: 11/27 UCx: E coli pan-sensitive 11/30 BCx: NGTD 11/30 UCx: negative  MRSA PCR: negative  Thank you for allowing pharmacy to be a part of this patient's care.  Luisa HartChristy, Azreal Stthomas D 06/09/2016 2:26 PM

## 2016-06-09 NOTE — Progress Notes (Signed)
Inpatient Diabetes Program Recommendations  AACE/ADA: New Consensus Statement on Inpatient Glycemic Control (2015)  Target Ranges:  Prepandial:   less than 140 mg/dL      Peak postprandial:   less than 180 mg/dL (1-2 hours)      Critically ill patients:  140 - 180 mg/dL   Results for [name], Julie Burch (MRN [id number]) as of 06/09/2016 12:58  Ref. Range 06/08/2016 08:52 06/08/2016 09:59 06/08/2016 11:18 06/08/2016 12:28 06/08/2016 13:39 06/08/2016 14:53 06/08/2016 15:59 06/08/2016 16:57 06/08/2016 18:05 06/08/2016 18:53 06/08/2016 19:18 06/08/2016 20:21 06/08/2016 21:33 06/08/2016 23:56 06/09/2016 04:52 06/09/2016 05:59 06/09/2016 07:21 06/09/2016 08:10 06/09/2016 11:43  Glucose-Capillary Latest Ref Range: 65 - 99 mg/dL >782>600 (HH) 956561 (HH) 213463 (H) 421 (H) 355 (H) 303 (H) 239 (H) 157 (H) 103 (H) 67 94 108 (H) 126 (H) 229 (H) 267 (H) 283 (H) 308 (H) 287 (H) 289 (H)    Admit with: AMS/ Septic Shock  History: DM (LADA- Treated at [facility])  Home DM Meds: Lantus 17 units QHS       Humalog 5 units Breakfast/ 5 units Lunch/ 3 units Dinner  Current Insulin Orders: Lantus 10 units QHS      Novolog Sensitive Correction Scale/ SSI (0-9 units) Q4 hours       -Note patient started on IV Insulin drip yesterday at 9am.  Patient's CBGs stabilized and patient was taken off IV Insulin drip yesterday at 7:23pm.  Patient was given 10 units Lantus at approximately 8pm last night.  -IV Insulin drip restarted today around 7am, but then discontinued shortly after.  -Patient now has orders for Lantus 10 units QHS + Novolog Sensitive Correction Scale/ SSI (0-9 units) Q4 hours.      MD- Please consider increasing Lantus to 17 units QHS (home dose)     --Will follow patient during hospitalization--  Ambrose FinlandJeannine Johnston Gerrianne Aydelott RN, MSN, CDE Diabetes Coordinator Inpatient Glycemic Control Team Team Pager: [phone] (8a-5p)

## 2016-06-09 NOTE — Clinical Social Work Note (Signed)
Clinical Social Work Assessment  Patient Details  Name: Julie Burch MRN: [id number] Date of Birth: [birth year]  Date of referral:  06/09/16               Reason for consult:  Facility Placement                Permission sought to share information with:    Permission granted to share information::     Name::        Agency::     Relationship::     Contact Information:     Housing/Transportation Living arrangements for the past 2 months:  [place] of Information:  Adult Children Patient Interpreter Needed:  None Criminal Activity/Legal Involvement Pertinent to Current Situation/Hospitalization:  No - Comment as needed Significant Relationships:  Adult Children Lives with:  Facility Resident Do you feel safe going back to the place where you live?  Yes Need for family participation in patient care:  Yes (Comment)  Care giving concerns:  Patient resides at [facility].   Social Worker assessment / plan:  CSW received consult that patient is from [facility]. Patient is currently sleeping soundly but family is in her ICU room. CSW met with patient's daughter this morning at patient's bedside and she confirmed that patient is a resident at [facility]. CSW explained role and purpose of visit. Patient's daughter reports that they do wish for patient to return to [facility] if able and that they wish to provide transport. CSW will await to see how patient progresses in the event she may need a higher level of care. If not, CSW will facilitate her return to [facility].   Employment status:  Retired Forensic scientist:  Medicare PT Recommendations:  Not assessed at this time Information / Referral to community resources:     Patient/Family's Response to care:  Patient's daughter expressed appreciation for CSW assistance.  Patient/Family's Understanding of and Emotional Response to Diagnosis, Current Treatment, and Prognosis:  Patient's daughter  is understandably concerned about her mom and is very attentive. It is unknown as of yet what patient's prognosis is.  Emotional Assessment Appearance:  Appears younger than stated age Attitude/Demeanor/Rapport:   (patient sleeping soundly at time of assessment with family) Affect (typically observed):   (patient sleeping soundly at time of assessment with family) Orientation:   (unable to assess) Alcohol / Substance use:  Not Applicable Psych involvement (Current and /or in the community):  No (Comment)  Discharge Needs  Concerns to be addressed:  Care Coordination Readmission within the last 30 days:  No Current discharge risk:  None Barriers to Discharge:  No Barriers Identified   Shela Leff, LCSW 06/09/2016, 11:29 AM

## 2016-06-09 NOTE — Clinical Social Work Note (Signed)
GrenadaBrittany, the [facility], returned CSW call and stated that patient's baseline is wheelchair bound but being able to stand and pivot. She reports that patient at times is confused but is wanting to keep her independence as much as she can. GrenadaBrittany stated no concerns about taking patient back as long as she can stand and pivot from her wheelchair.  York SpanielMonica Zymier Rodgers MSW,LCSW [phone]

## 2016-06-09 NOTE — Progress Notes (Signed)
Patient's temperature is steadily increasing. Temperature is currently 37.9 Celsius. Contacted the NP and she ordered tylenol 650 mg suppository for mild fever and pain. Will continue to monitor patient.

## 2016-06-09 NOTE — Progress Notes (Signed)
PULMONARY / CRITICAL CARE MEDICINE   Name: Julie DikeMary Burch MRN: [id number] DOB: 09-24-1926    ADMISSION DATE:  06/08/2016   CHIEF COMPLAINT:    INITIAL PRESENTATION: mental status changes     HISTORY OF PRESENT ILLNESS:  BP stable   Patient was given 2 L IVF off of insulin infusion Patient remains confused LA 6-->1.8 Creatinine and K improved   Allergies  Allergen Reactions  . Aspirin Other (See Comments)    Reaction:  Intracranial hemorrhage   . Ciprofloxacin Other (See Comments)    Reaction:  Hallucinations/severe agitation    . Diovan [Valsartan] Other (See Comments)    Reaction:  High potassium levels   . Fosamax [Alendronate Sodium] Other (See Comments)    Reaction:  GI upset   . Sulfa Antibiotics Hives    REVIEW OF SYSTEMS:   Unobtainable due to confusion    VITAL SIGNS: Temp:  [95.6 F (35.3 C)-100.4 F (38 C)] 100.4 F (38 C) (12/01 0500) Pulse Rate:  [51-158] 110 (12/01 0500) Resp:  [17-31] 25 (12/01 0500) BP: (98-191)/(42-104) 169/88 (12/01 0500) SpO2:  [95 %-100 %] 96 % (12/01 0500) Weight:  [119 lb 7.8 oz (54.2 kg)] 119 lb 7.8 oz (54.2 kg) (12/01 0459) HEMODYNAMICS:     Intake/Output Summary (Last 24 hours) at 06/09/16 0758 Last data filed at 06/09/16 [id number]  Gross per 24 hour  Intake          3279.07 ml  Output             1238 ml  Net          2041.07 ml    PHYSICAL EXAMINATION:  GENERAL:critically ill appearing, -resp distress HEAD: Normocephalic, atraumatic.  EYES: Pupils equal, round, reactive to light.  No scleral icterus.  MOUTH: Moist mucosal membrane. NECK: Supple. No thyromegaly. No nodules. No JVD.  PULMONARY: +rhonchi CARDIOVASCULAR: S1 and S2. Regular rate and rhythm. No murmurs, rubs, or gallops.  GASTROINTESTINAL: Soft, nontender, No masses. Positive bowel sounds. No hepatosplenomegaly.  MUSCULOSKELETAL: No swelling, clubbing, or edema.  NEUROLOGIC:confused SKIN:intact,warm,dry    LABS:  CBC  Recent Labs Lab  06/05/16 1728 06/08/16 0638 06/09/16 0357  WBC 6.4 11.5* 16.1*  HGB 12.5 10.8* 11.0*  HCT 37.6 36.0 33.4*  PLT 167 183 117*   Coag's No results for input(s): APTT, INR in the last 168 hours. BMET  Recent Labs Lab 06/08/16 0638 06/08/16 1237 06/09/16 0357  NA 128* 136 137  K 6.5* 4.0 4.9  CL 93* 104 107  CO2 10* 18* 21*  BUN 46* 43* 38*  CREATININE 2.61* 2.48* 1.69*  GLUCOSE 856* 463* 276*   Electrolytes  Recent Labs Lab 06/08/16 0638 06/08/16 1237 06/09/16 0357  CALCIUM 8.5* 9.5 8.9  MG  --  2.0  --    Sepsis Markers  Recent Labs Lab 06/08/16 0948 06/08/16 2123 06/09/16 0015  LATICACIDVEN 5.6* 1.6 1.8   ABG No results for input(s): PHART, PCO2ART, PO2ART in the last 168 hours. Liver Enzymes  Recent Labs Lab 06/08/16 0638  AST 52*  ALT 19  ALKPHOS 65  BILITOT 1.4*  ALBUMIN 3.3*   Cardiac Enzymes  Recent Labs Lab 06/05/16 1728  TROPONINI 0.03*   Glucose  Recent Labs Lab 06/08/16 2021 06/08/16 2133 06/08/16 2356 06/09/16 0452 06/09/16 0559 06/09/16 0721  GLUCAP 108* 126* 229* 267* 283* 308*    Imaging Dg Chest Port 1 View  Result Date: 06/09/2016 CLINICAL DATA:  Acute onset of shortness of breath. Initial encounter. EXAM:  PORTABLE CHEST 1 VIEW COMPARISON:  Chest radiograph performed 06/08/2016 FINDINGS: The lungs are well-aerated. Mild peribronchial thickening is noted. There is no evidence of focal opacification, pleural effusion or pneumothorax. The cardiomediastinal silhouette is borderline enlarged. No acute osseous abnormalities are seen. IMPRESSION: Mild peribronchial thickening noted.  Borderline cardiomegaly. Electronically Signed   By: Roanna RaiderJeffery  Chang M.D.   On: 06/09/2016 05:19   Dg Chest Portable 1 View  Result Date: 06/08/2016 CLINICAL DATA:  Unresponsive.  Recent fall . EXAM: PORTABLE CHEST 1 VIEW COMPARISON:  05/06/2016 . FINDINGS: Cardiomegaly with normal pulmonary vascularity. Low lung volumes. No pleural effusion or  pneumothorax . Calcified pulmonary nodules consistent with granulomas. No acute bony abnormality . IMPRESSION: 1.  Cardiomegaly with normal pulmonary vascularity. 2. Low lung volumes. Electronically Signed   By: Maisie Fushomas  Register   On: 06/08/2016 08:16     ASSESSMENT / PLAN:   82 yo white female with severe sepsis and septic shock from UTI with acute renal failure and severe metabolic acidosis and encephalopathy-slowly improving   PULMONARY Oxygen as needed  CARDIOVASCULAR Septic shock -IVF's Sepsis protocol Will assess for CLV  RENAL ARF from ATN -follow chem 7 Elevated k -repeat Chem 7 pending -place foley  GASTROINTESTINAL Keep NPO  HEMATOLOGIC Follow cbc  INFECTIOUS Follow up cultures Rocephin for abx    ENDOCRINE Insulin infusion  NEUROLOGIC Avoid sedatives   I have personally obtained a history, examined the patient, evaluated Pertinent laboratory and RadioGraphic/imaging results, and  formulated the assessment and plan   The Patient requires high complexity decision making for assessment and support, frequent evaluation and titration of therapies, application of advanced monitoring technologies and extensive interpretation of multiple databases. Patient is now DNR/DNI   Will transfer care to hospitalist service    Lucie LeatherKurian David Maxine Fredman, M.D.  [facility]  Medical Director [facility] Medical Director [facility] Cardio-Pulmonary Department

## 2016-06-09 NOTE — Progress Notes (Signed)
Case discussed with Dr Konidena hospitalist and will transfer care in next 24 hrs. 

## 2016-06-09 NOTE — Progress Notes (Signed)
NP made aware of the 3 kg weight gain in one day as well as the temperature increase. Will use ice pack to help lower patient's temperature. Will continue to monitor.

## 2016-06-09 NOTE — Progress Notes (Signed)
NP made aware of patient blood glucose. NP instructed me to administer 4 units of insulin and to recheck in one hour.Will continue to monitor.

## 2016-06-10 ENCOUNTER — Inpatient Hospital Stay: Payer: Medicare Other

## 2016-06-10 LAB — BASIC METABOLIC PANEL
ANION GAP: 6 (ref 5–15)
BUN: 31 mg/dL — AB (ref 6–20)
CHLORIDE: 108 mmol/L (ref 101–111)
CO2: 25 mmol/L (ref 22–32)
Calcium: 8.5 mg/dL — ABNORMAL LOW (ref 8.9–10.3)
Creatinine, Ser: 1.16 mg/dL — ABNORMAL HIGH (ref 0.44–1.00)
GFR calc Af Amer: 48 mL/min — ABNORMAL LOW (ref >60)
GFR calc non Af Amer: 41 mL/min — ABNORMAL LOW (ref >60)
GLUCOSE: 134 mg/dL — AB (ref 65–99)
POTASSIUM: 3.7 mmol/L (ref 3.5–5.1)
Sodium: 139 mmol/L (ref 135–145)

## 2016-06-10 LAB — C DIFFICILE QUICK SCREEN W PCR REFLEX
C DIFFICILE (CDIFF) TOXIN: NEGATIVE
C DIFFICLE (CDIFF) ANTIGEN: POSITIVE — AB

## 2016-06-10 LAB — GLUCOSE, CAPILLARY
GLUCOSE-CAPILLARY: 180 mg/dL — AB (ref 65–99)
GLUCOSE-CAPILLARY: 176 mg/dL — AB (ref 65–99)
Glucose-Capillary: 123 mg/dL — ABNORMAL HIGH (ref 65–99)
Glucose-Capillary: 123 mg/dL — ABNORMAL HIGH (ref 65–99)
Glucose-Capillary: 125 mg/dL — ABNORMAL HIGH (ref 65–99)
Glucose-Capillary: 151 mg/dL — ABNORMAL HIGH (ref 65–99)
Glucose-Capillary: 154 mg/dL — ABNORMAL HIGH (ref 65–99)

## 2016-06-10 LAB — CBC
HEMATOCRIT: 30.7 % — AB (ref 35.0–47.0)
HEMOGLOBIN: 10.4 g/dL — AB (ref 12.0–16.0)
MCH: 31.3 pg (ref 26.0–34.0)
MCHC: 34 g/dL (ref 32.0–36.0)
MCV: 91.9 fL (ref 80.0–100.0)
Platelets: 148 10*3/uL — ABNORMAL LOW (ref 150–440)
RBC: 3.34 MIL/uL — ABNORMAL LOW (ref 3.80–5.20)
RDW: 16.2 % — AB (ref 11.5–14.5)
WBC: 13.9 10*3/uL — ABNORMAL HIGH (ref 3.6–11.0)

## 2016-06-10 LAB — PHOSPHORUS: PHOSPHORUS: 2.7 mg/dL (ref 2.5–4.6)

## 2016-06-10 LAB — MAGNESIUM: Magnesium: 1.7 mg/dL (ref 1.7–2.4)

## 2016-06-10 LAB — AMMONIA: AMMONIA: 16 umol/L (ref 9–35)

## 2016-06-10 LAB — CLOSTRIDIUM DIFFICILE BY PCR: Toxigenic C. Difficile by PCR: POSITIVE — AB

## 2016-06-10 MED ORDER — ENOXAPARIN SODIUM 30 MG/0.3ML ~~LOC~~ SOLN
30.0000 mg | SUBCUTANEOUS | Status: DC
  Administered 2016-06-10 – 2016-06-14 (×5): 30 mg via SUBCUTANEOUS
  Filled 2016-06-10 (×5): qty 0.3

## 2016-06-10 NOTE — Progress Notes (Signed)
PULMONARY / CRITICAL CARE MEDICINE   Name: Julie Burch MRN: [id number] DOB: [birth year]    ADMISSION DATE:  06/08/2016   CHIEF COMPLAINT:    INITIAL PRESENTATION: mental status changes     HISTORY OF PRESENT ILLNESS:  BP stable   Patient was given 2 L IVF off of insulin infusion Patient remains confused LA 6-->1.8 Creatinine and K improved   Allergies  Allergen Reactions  . Aspirin Other (See Comments)    Reaction:  Intracranial hemorrhage   . Ciprofloxacin Other (See Comments)    Reaction:  Hallucinations/severe agitation    . Diovan [Valsartan] Other (See Comments)    Reaction:  High potassium levels   . Fosamax [Alendronate Sodium] Other (See Comments)    Reaction:  GI upset   . Sulfa Antibiotics Hives    REVIEW OF SYSTEMS:   Unobtainable due to confusion    VITAL SIGNS: Temp:  [99.9 F (37.7 C)-100.4 F (38 C)] 99.9 F (37.7 C) (12/02 0800) Pulse Rate:  [74-88] 85 (12/02 0800) Resp:  [19-30] 25 (12/02 0800) BP: (146-185)/(53-102) 179/67 (12/02 0804) SpO2:  [94 %-100 %] 98 % (12/02 0800) Weight:  [123 lb 0.3 oz (55.8 kg)] 123 lb 0.3 oz (55.8 kg) (12/02 0355) HEMODYNAMICS:     Intake/Output Summary (Last 24 hours) at 06/10/16 0853 Last data filed at 06/10/16 0600  Gross per 24 hour  Intake           990.83 ml  Output              615 ml  Net           375.83 ml    PHYSICAL EXAMINATION:  GENERAL:critically ill appearing, -resp distress HEAD: Normocephalic, atraumatic.  EYES: Pupils equal, round, reactive to light.  No scleral icterus.  MOUTH: Moist mucosal membrane. NECK: Supple. No thyromegaly. No nodules. No JVD.  PULMONARY: +rhonchi CARDIOVASCULAR: S1 and S2. Regular rate and rhythm. No murmurs, rubs, or gallops.  GASTROINTESTINAL: Soft, nontender, No masses. Positive bowel sounds. No hepatosplenomegaly.  MUSCULOSKELETAL: No swelling, clubbing, or edema.  NEUROLOGIC:confused SKIN:intact,warm,dry    LABS:  CBC  Recent Labs Lab  06/08/16 0638 06/09/16 0357 06/10/16 0524  WBC 11.5* 16.1* 13.9*  HGB 10.8* 11.0* 10.4*  HCT 36.0 33.4* 30.7*  PLT 183 117* 148*   Coag's No results for input(s): APTT, INR in the last 168 hours. BMET  Recent Labs Lab 06/08/16 1237 06/09/16 0357 06/10/16 0524  NA 136 137 139  K 4.0 4.9 3.7  CL 104 107 108  CO2 18* 21* 25  BUN 43* 38* 31*  CREATININE 2.48* 1.69* 1.16*  GLUCOSE 463* 276* 134*   Electrolytes  Recent Labs Lab 06/08/16 1237 06/09/16 0357 06/10/16 0524  CALCIUM 9.5 8.9 8.5*  MG 2.0  --  1.7  PHOS  --   --  2.7   Sepsis Markers  Recent Labs Lab 06/08/16 0948 06/08/16 2123 06/09/16 0015  LATICACIDVEN 5.6* 1.6 1.8   ABG No results for input(s): PHART, PCO2ART, PO2ART in the last 168 hours. Liver Enzymes  Recent Labs Lab 06/08/16 0638  AST 52*  ALT 19  ALKPHOS 65  BILITOT 1.4*  ALBUMIN 3.3*   Cardiac Enzymes  Recent Labs Lab 06/05/16 1728  TROPONINI 0.03*   Glucose  Recent Labs Lab 06/09/16 1143 06/09/16 1804 06/09/16 2124 06/10/16 0053 06/10/16 0331 06/10/16 0730  GLUCAP 289* 238* 191* 151* 125* 154*    Imaging Ct Head Wo Contrast  Result Date: 06/09/2016 CLINICAL DATA:  Acute encephalopathy.  Sepsis. EXAM: CT HEAD WITHOUT CONTRAST TECHNIQUE: Contiguous axial images were obtained from the base of the skull through the vertex without intravenous contrast. COMPARISON:  07/05/2016 FINDINGS: BRAIN: There is moderate sulcal and ventricular prominence consistent with superficial and central atrophy. No intraparenchymal hemorrhage, mass effect nor midline shift. There are chronic stable periventricular and subcortical areas of white matter hypoattenuation consistent with chronic small vessel ischemic disease. No acute large vascular territory infarcts. No abnormal extra-axial fluid collections. Basal cisterns are patent. VASCULAR: Moderate calcific atherosclerosis of the carotid siphons. SKULL: No skull fracture. No significant scalp  soft tissue swelling. SINUSES/ORBITS: The mastoid air-cells are clear. Trace air-fluid levels in both maxillary sinuses are new since prior exam. Mild acute sinusitis is not excluded. Soft tissue densities consistent mucus noted in the coronaries left-greater-than-right. Mild ethmoid sinus mucosal thickening. Bilateral lens replacement surgical changes.The included ocular globes and orbital contents are non-suspicious. OTHER: None. IMPRESSION: No acute intracranial abnormality. Chronic moderate small vessel ischemic disease of periventricular and subcortical white matter. Cerebral atrophy. Acute maxillary sinusitis. Electronically Signed   By: Tollie Ethavid  Kwon M.D.   On: 06/09/2016 15:49     ASSESSMENT / PLAN:   82 yo white female with severe sepsis and septic shock from UTI with acute renal failure and severe metabolic acidosis and encephalopathy-slowly improving   PULMONARY Oxygen as needed  CARDIOVASCULAR Septic shock -IVF's Sepsis protocol Will assess for CLV  RENAL ARF from ATN -follow chem 7 Elevated k -repeat Chem 7 pending -place foley  GASTROINTESTINAL Keep NPO  HEMATOLOGIC Follow cbc  INFECTIOUS Follow up cultures Rocephin for abx    ENDOCRINE Insulin infusion  NEUROLOGIC CT negative Will obtain MRI   I have personally obtained a history, examined the patient, evaluated Pertinent laboratory and RadioGraph[employer]/imaging results, and  formulated the assessment and plan   The Patient requires high complexity decision making for assessment and support, frequent evaluation and titration of therapies, application of advanced monitoring technologies and extensive interpretation of multiple databases. Patient is now DNR/DNI   Will transfer care to hospitalist service    Lucie LeatherKurian David Jakara Blatter, M.D.  [facility]  Medical Director [facility] Medical Director [facility]

## 2016-06-10 NOTE — Progress Notes (Signed)
Received [place] from [place] will take over care of this pt. At this time.

## 2016-06-10 NOTE — Progress Notes (Signed)
[employer] [facility] [place] [facility]   PATIENT NAME: Julie DikeMary Burch    MR#:  [id number]  DATE OF BIRTH:  Dec 07, 1926  SUBJECTIVE:  CHIEF COMPLAINT:   Chief Complaint  Patient presents with  . Code Sepsis   -Patient admitted with sepsis, numbers are improving. -Still very lethargic. So stays nothing by mouth -Family updated at bedside  REVIEW OF SYSTEMS:  Review of Systems  Unable to perform ROS: Mental acuity    DRUG ALLERGIES:   Allergies  Allergen Reactions  . Aspirin Other (See Comments)    Reaction:  Intracranial hemorrhage   . Ciprofloxacin Other (See Comments)    Reaction:  Hallucinations/severe agitation    . Diovan [Valsartan] Other (See Comments)    Reaction:  High potassium levels   . Fosamax [Alendronate Sodium] Other (See Comments)    Reaction:  GI upset   . Sulfa Antibiotics Hives    VITALS:  Blood pressure (!) 163/64, pulse 80, temperature 99.9 F (37.7 C), resp. rate (!) 23, height 5\' 4"  (1.626 m), weight 55.8 kg (123 lb 0.3 oz), SpO2 95 %.  PHYSICAL EXAMINATION:  Physical Exam  GENERAL:  82 y.o.-year-old patient lying in the bed with no acute distress. Eyes closed. Hard of hearing at baseline. EYES: Pupils equal, round, reactive to light and accommodation. No scleral icterus. Extraocular muscles intact.  HEENT: Head atraumatic, normocephalic. Oropharynx and nasopharynx clear.  NECK:  Supple, no jugular venous distention. No thyroid enlargement, no tenderness.  LUNGS: Normal breath sounds bilaterally, no wheezing, rales,rhonchi or crepitation. No use of accessory muscles of respiration. Decreased bibasilar breath sounds. CARDIOVASCULAR: S1, S2 normal. No  rubs, or gallops. 3/6 systolic murmur is present. ABDOMEN: Soft, nontender, nondistended. Bowel sounds present. No organomegaly or mass.  EXTREMITIES: No pedal edema, cyanosis, or clubbing.  NEUROLOGIC: Eyes closed, nodding head occasionally to some questions. Following simple  commands today. PSYCHIATRIC: The patient is lethargic, following commands intermittently. SKIN: No obvious rash, lesion, or ulcer.    LABORATORY PANEL:   CBC  Recent Labs Lab 06/10/16 0524  WBC 13.9*  HGB 10.4*  HCT 30.7*  PLT 148*   ------------------------------------------------------------------------------------------------------------------  Chemistries   Recent Labs Lab 06/08/16 0638  06/10/16 0524  NA 128*  < > 139  K 6.5*  < > 3.7  CL 93*  < > 108  CO2 10*  < > 25  GLUCOSE 856*  < > 134*  BUN 46*  < > 31*  CREATININE 2.61*  < > 1.16*  CALCIUM 8.5*  < > 8.5*  MG  --   < > 1.7  AST 52*  --   --   ALT 19  --   --   ALKPHOS 65  --   --   BILITOT 1.4*  --   --   < > = values in this interval not displayed. ------------------------------------------------------------------------------------------------------------------  Cardiac Enzymes  Recent Labs Lab 06/05/16 1728  TROPONINI 0.03*   ------------------------------------------------------------------------------------------------------------------  RADIOLOGY:  Ct Head Wo Contrast  Result Date: 06/09/2016 CLINICAL DATA:  Acute encephalopathy.  Sepsis. EXAM: CT HEAD WITHOUT CONTRAST TECHNIQUE: Contiguous axial images were obtained from the base of the skull through the vertex without intravenous contrast. COMPARISON:  07/05/2016 FINDINGS: BRAIN: There is moderate sulcal and ventricular prominence consistent with superficial and central atrophy. No intraparenchymal hemorrhage, mass effect nor midline shift. There are chronic stable periventricular and subcortical areas of white matter hypoattenuation consistent with chronic small vessel ischemic disease. No acute large vascular territory  infarcts. No abnormal extra-axial fluid collections. Basal cisterns are patent. VASCULAR: Moderate calcific atherosclerosis of the carotid siphons. SKULL: No skull fracture. No significant scalp soft tissue swelling.  SINUSES/ORBITS: The mastoid air-cells are clear. Trace air-fluid levels in both maxillary sinuses are new since prior exam. Mild acute sinusitis is not excluded. Soft tissue densities consistent mucus noted in the coronaries left-greater-than-right. Mild ethmoid sinus mucosal thickening. Bilateral lens replacement surgical changes.The included ocular globes and orbital contents are non-suspicious. OTHER: None. IMPRESSION: No acute intracranial abnormality. Chronic moderate small vessel ischemic disease of periventricular and subcortical white matter. Cerebral atrophy. Acute maxillary sinusitis. Electronically Signed   By: Tollie Ethavid  Kwon M.D.   On: 06/09/2016 15:49   Dg Chest Port 1 View  Result Date: 06/09/2016 CLINICAL DATA:  Acute onset of shortness of breath. Initial encounter. EXAM: PORTABLE CHEST 1 VIEW COMPARISON:  Chest radiograph performed 06/08/2016 FINDINGS: The lungs are well-aerated. Mild peribronchial thickening is noted. There is no evidence of focal opacification, pleural effusion or pneumothorax. The cardiomediastinal silhouette is borderline enlarged. No acute osseous abnormalities are seen. IMPRESSION: Mild peribronchial thickening noted.  Borderline cardiomegaly. Electronically Signed   By: Roanna RaiderJeffery  Chang M.D.   On: 06/09/2016 05:19    EKG:   Orders placed or performed during the hospital encounter of 06/08/16  . EKG 12-Lead  . EKG 12-Lead  . ED EKG 12-Lead  . ED EKG 12-Lead  . EKG 12-Lead  . EKG 12-Lead    ASSESSMENT AND PLAN:   82 year old female with past medical history significant for diabetes mellitus, GERD, depression, hypertension, history of stroke presents to hospital from assisted living facility secondary to altered mental status.  #1 altered mental status-metabolic encephalopathy secondary to sepsis. -Still very lethargic today. Following some simple commands though. -No focal deficits. MRI of the brain has been ordered by intensivist  #2 septic shock-secondary  to acute cystitis. -Urine cultures growing sensitive Escherichia coli. Continue IV Rocephin until able to take oral medications -Blood cultures are negative. Blood pressure is improving, actually on the higher side today  #3 acute renal failure-likely ATN from sepsis -Improving with fluids. Continue to monitor.  #4 diabetes mellitus-sugars were in 800s when she came in and was on insulin drip. Improved with fluids -On Lantus and sliding scale at this time. -A1c is pending  #5 hyperkalemia-secondary to metabolic acidosis on admission. Improved now.  #6 DVT prophylaxis-on Lovenox  Physical therapy consult once more alert    All the records are reviewed and case discussed with Care Management/Social Workerr. Management plans discussed with the patient, family and they are in agreement.  CODE STATUS: DO NOT RESUSCITATE  TOTAL TIME TAKING CARE OF THIS PATIENT: 37 minutes.   POSSIBLE D/C IN 2-3 DAYS, DEPENDING ON CLINICAL CONDITION.   Enid BaasKALISETTI,Sujay Grundman M.D on 06/10/2016 at 1:47 PM  Between 7am to 6pm - Pager - [phone]  After 6pm go to [email or web address] - Social research officer, governmentpassword EPAS [facility]  [facility] [place] [facility]  Office  [phone]  CC: Primary care physician; Mariam DollarPHELPS, ANNE F, MD

## 2016-06-10 NOTE — Progress Notes (Signed)
Less drowsy and more alert as day progresses. Speaking more. Confused to time and place. Recognizes daughter. Speech is a little garbled. Will need SLT swallow eval. NPO. MRI result with small infarct was discussed with family by Dr Jennye BoroughsKalsetti. Apresoline x1 and Labetolol x1 for elevated bp today

## 2016-06-11 ENCOUNTER — Inpatient Hospital Stay: Payer: Medicare Other

## 2016-06-11 LAB — LIPID PANEL
CHOLESTEROL: 171 mg/dL (ref 0–200)
HDL: 51 mg/dL (ref >40)
LDL Cholesterol: 105 mg/dL — ABNORMAL HIGH (ref 0–99)
TRIGLYCERIDES: 74 mg/dL (ref <150)
Total CHOL/HDL Ratio: 3.4 RATIO
VLDL: 15 mg/dL (ref 0–40)

## 2016-06-11 LAB — BASIC METABOLIC PANEL
ANION GAP: 5 (ref 5–15)
BUN: 29 mg/dL — ABNORMAL HIGH (ref 6–20)
CALCIUM: 8.1 mg/dL — AB (ref 8.9–10.3)
CO2: 26 mmol/L (ref 22–32)
CREATININE: 1.19 mg/dL — AB (ref 0.44–1.00)
Chloride: 108 mmol/L (ref 101–111)
GFR calc Af Amer: 47 mL/min — ABNORMAL LOW (ref >60)
GFR, EST NON AFRICAN AMERICAN: 40 mL/min — AB (ref >60)
GLUCOSE: 178 mg/dL — AB (ref 65–99)
Potassium: 3.5 mmol/L (ref 3.5–5.1)
Sodium: 139 mmol/L (ref 135–145)

## 2016-06-11 LAB — CBC
HCT: 29.3 % — ABNORMAL LOW (ref 35.0–47.0)
HEMOGLOBIN: 10 g/dL — AB (ref 12.0–16.0)
MCH: 32.4 pg (ref 26.0–34.0)
MCHC: 34.2 g/dL (ref 32.0–36.0)
MCV: 94.8 fL (ref 80.0–100.0)
Platelets: 135 10*3/uL — ABNORMAL LOW (ref 150–440)
RBC: 3.09 MIL/uL — AB (ref 3.80–5.20)
RDW: 16.9 % — ABNORMAL HIGH (ref 11.5–14.5)
WBC: 7.3 10*3/uL (ref 3.6–11.0)

## 2016-06-11 LAB — MAGNESIUM: MAGNESIUM: 1.7 mg/dL (ref 1.7–2.4)

## 2016-06-11 LAB — GLUCOSE, CAPILLARY
GLUCOSE-CAPILLARY: 149 mg/dL — AB (ref 65–99)
GLUCOSE-CAPILLARY: 219 mg/dL — AB (ref 65–99)
GLUCOSE-CAPILLARY: 169 mg/dL — AB (ref 65–99)
Glucose-Capillary: 106 mg/dL — ABNORMAL HIGH (ref 65–99)
Glucose-Capillary: 145 mg/dL — ABNORMAL HIGH (ref 65–99)

## 2016-06-11 LAB — PHOSPHORUS: PHOSPHORUS: 3.2 mg/dL (ref 2.5–4.6)

## 2016-06-11 MED ORDER — METRONIDAZOLE 500 MG PO TABS
500.0000 mg | ORAL_TABLET | Freq: Three times a day (TID) | ORAL | Status: DC
  Administered 2016-06-11 – 2016-06-15 (×13): 500 mg via ORAL
  Filled 2016-06-11 (×13): qty 1

## 2016-06-11 MED ORDER — SODIUM CHLORIDE 0.9 % IV BOLUS (SEPSIS)
1000.0000 mL | Freq: Once | INTRAVENOUS | Status: AC
  Administered 2016-06-11: 1000 mL via INTRAVENOUS

## 2016-06-11 MED ORDER — QUETIAPINE FUMARATE ER 50 MG PO TB24
50.0000 mg | ORAL_TABLET | Freq: Every day | ORAL | Status: DC
  Administered 2016-06-11 – 2016-06-12 (×2): 50 mg via ORAL
  Filled 2016-06-11 (×3): qty 1

## 2016-06-11 MED ORDER — AMLODIPINE BESYLATE 5 MG PO TABS: 2.5000 mg | ORAL_TABLET | Freq: Every day | ORAL | Status: DC

## 2016-06-11 MED ORDER — RISAQUAD PO CAPS
1.0000 | ORAL_CAPSULE | Freq: Two times a day (BID) | ORAL | Status: DC
  Administered 2016-06-11 – 2016-06-15 (×9): 1 via ORAL
  Filled 2016-06-11 (×9): qty 1

## 2016-06-11 MED ORDER — ATORVASTATIN CALCIUM 20 MG PO TABS
40.0000 mg | ORAL_TABLET | Freq: Every day | ORAL | Status: DC
  Administered 2016-06-11 – 2016-06-14 (×4): 40 mg via ORAL
  Filled 2016-06-11 (×4): qty 2

## 2016-06-11 MED ORDER — AMLODIPINE BESYLATE 5 MG PO TABS: 5.0000 mg | ORAL_TABLET | Freq: Every day | ORAL | Status: DC

## 2016-06-11 NOTE — Evaluation (Signed)
Physical Therapy Evaluation Patient Details Name: Julie Burch MRN: [id number] DOB: 1926/08/31 Today's Date: 06/11/2016   History of Present Illness  Pt is an 82 y/o female that presents with AMS, found to have septic shock from recurrent UTI. MRI showed L insular infarct, chronic intracranial hemorrhages.   Clinical Impression  Patient is an 82 y/o female seen for evaluation after being found unresponsive at her ALF. She was found to have L insular infarct and chronic intracranial hemorrhages on imaging. She is normally quite dependent for transfers, and does not ambulate. She was noted to lean to her R, have mild R sided deviation of tongue, mild facial droop and per daughter improving but still garbled speech. Noted to require max A x1 to maintain balance due to R sided lean in sitting, she is able to independently sit for periods, but fatigues quickly. She is unable to provide sufficient effort for sit to stand transfers at this time. Her fine motor and gross motor skills of RUE are also decreased from L side (thumb to index/middle finger). Would recommend transfer to SNF to increase her mobility before return to her previous living establishment.     Follow Up Recommendations SNF    Equipment Recommendations       Recommendations for Other Services       Precautions / Restrictions Precautions Precautions: Fall Restrictions Weight Bearing Restrictions: No      Mobility  Bed Mobility Overal bed mobility: Needs Assistance Bed Mobility: Supine to Sit;Sit to Supine     Supine to sit: Total assist Sit to supine: Total assist   General bed mobility comments: Patient unable to provide extensive assistance during transfer secondary to weakness andpoor cognition.   Transfers Overall transfer level: Needs assistance Equipment used: 2 person hand held assist Transfers: Sit to/from Stand Sit to Stand: Max assist         General transfer comment: Patient leans to L for stronger  side, unable to provide much assistance on transfer attempt x 3.   Ambulation/Gait                Stairs            Wheelchair Mobility    Modified Rankin (Stroke Patients Only)       Balance Overall balance assessment: Needs assistance;History of Falls Sitting-balance support: Bilateral upper extremity supported Sitting balance-Leahy Scale: Poor   Postural control: Right lateral lean Standing balance support: Bilateral upper extremity supported Standing balance-Leahy Scale: Zero                               Pertinent Vitals/Pain Pain Assessment: No/denies pain    Home Living Family/patient expects to be discharged to:: Assisted living               Home Equipment: Wheelchair - manual Additional Comments: At baseline patient requires assistance for transfers to w/c, she is able to use her LEs to scoot the w/c around the hallways. She is typically able to feed herself, requries assistance for OOB transfers.     Prior Function Level of Independence: Needs assistance               Hand Dominance        Extremity/Trunk Assessment   Upper Extremity Assessment: RUE deficits/detail RUE Deficits / Details: Decreased fine and gross motor skills noted, though unable to formally test due to poor command following and balance.  Lower Extremity Assessment: RLE deficits/detail RLE Deficits / Details: R quad appeared weaker than L though unable to formally test secondary to cognition.        Communication   Communication: HOH (Garbled speech)  Cognition Arousal/Alertness: Lethargic Behavior During Therapy: WFL for tasks assessed/performed Overall Cognitive Status: History of cognitive impairments - at baseline (Appears to have age associated deficits in cognition)                      General Comments General comments (skin integrity, edema, etc.): Watery stool noted on bed sheets, informed RN staff     Exercises      Assessment/Plan    PT Assessment Patient needs continued PT services  PT Problem List Decreased strength;Decreased cognition;Decreased balance;Decreased activity tolerance;Decreased coordination          PT Treatment Interventions DME instruction;Functional mobility training;Therapeutic activities;Therapeutic exercise;Neuromuscular re-education;Wheelchair mobility training    PT Goals (Current goals can be found in the Care Plan section)  Acute Rehab PT Goals Patient Stated Goal: To improve her functional mobility.  PT Goal Formulation: With family Time For Goal Achievement: 06/25/16 Potential to Achieve Goals: Fair    Frequency 7X/week   Barriers to discharge        Co-evaluation               End of Session Equipment Utilized During Treatment: Gait belt Activity Tolerance: Patient tolerated treatment well;Patient limited by lethargy Patient left: in bed;with bed alarm set;with call bell/phone within reach;with family/visitor present Nurse Communication: Mobility status         Time: [id number] PT Time Calculation (min) (ACUTE ONLY): 28 min   Charges:   PT Evaluation $PT Eval Moderate Complexity: 1 Procedure     PT G Codes:       Kerin RansomPatrick A McNamara, PT, DPT    06/11/2016, 4:44 PM

## 2016-06-11 NOTE — Progress Notes (Signed)
Pt moved to room 106. Report called to Duwayne Heckanielle, RN. Pt and belongings moved to room 106 without incident.

## 2016-06-11 NOTE — Progress Notes (Signed)
Pt. UOP 10-15/hr, discussed w/ Dr. Sheryle Hailiamond. Also discussed PMHx, admission Dx, current status. Fluid bolus ordered, will continue to monitor pt. Closely.

## 2016-06-11 NOTE — Progress Notes (Signed)
PULMONARY / CRITICAL CARE MEDICINE   Name: Julie DikeMary Burch MRN: [id number] DOB: 1926/08/27    ADMISSION DATE:  06/08/2016   CHIEF COMPLAINT:  Altered mental status  INITIAL PRESENTATION: mental status changes   HISTORY OF PRESENT ILLNESS:  82 y/o female who presented to the ED with AMS. She was found to have a UTI and in septic shock. Patient was hypotensive, elevated FSBS, unresponsive, and increased WOB.   SUBJECTIVE: No acute issues overnight. Garbled speech, confused and generalized weakness. Decreased urine out put but good response with fluid bolus. Otherwise no other issues   Allergies  Allergen Reactions  . Aspirin Other (See Comments)    Reaction:  Intracranial hemorrhage   . Ciprofloxacin Other (See Comments)    Reaction:  Hallucinations/severe agitation    . Diovan [Valsartan] Other (See Comments)    Reaction:  High potassium levels   . Fosamax [Alendronate Sodium] Other (See Comments)    Reaction:  GI upset   . Sulfa Antibiotics Hives    REVIEW OF SYSTEMS:   Unobtainable due to confusion    VITAL SIGNS: Temp:  [98.2 F (36.8 C)-100.2 F (37.9 C)] 98.8 F (37.1 C) (12/03 0600) Pulse Rate:  [55-85] 67 (12/03 0600) Resp:  [14-27] 21 (12/03 0600) BP: (138-193)/(44-67) 142/55 (12/03 0600) SpO2:  [93 %-100 %] 99 % (12/03 0600) Weight:  [114 lb 10.2 oz (52 kg)] 114 lb 10.2 oz (52 kg) (12/03 0447) HEMODYNAMICS:     Intake/Output Summary (Last 24 hours) at 06/11/16 0757 Last data filed at 06/11/16 0600  Gross per 24 hour  Intake             1203 ml  Output              775 ml  Net              428 ml    PHYSICAL EXAMINATION:  GENERAL:critically ill appearing, -resp distress HEAD: Normocephalic, atraumatic.  EYES: Pupils equal, round, reactive to light.  No scleral icterus.  MOUTH: Moist mucosal membrane. NECK: Supple. No thyromegaly. No nodules. No JVD.  PULMONARY: +rhonchi CARDIOVASCULAR: S1 and S2. Regular rate and rhythm. No murmurs, rubs, or  gallops.  GASTROINTESTINAL: Soft, nontender, No masses. Positive bowel sounds. No hepatosplenomegaly.  MUSCULOSKELETAL: No swelling, clubbing, or edema.  NEUROLOGIC:confused SKIN:intact,warm,dry    LABS:  CBC  Recent Labs Lab 06/09/16 0357 06/10/16 0524 06/11/16 0506  WBC 16.1* 13.9* 7.3  HGB 11.0* 10.4* 10.0*  HCT 33.4* 30.7* 29.3*  PLT 117* 148* 135*   Coag's No results for input(s): APTT, INR in the last 168 hours. BMET  Recent Labs Lab 06/09/16 0357 06/10/16 0524 06/11/16 0506  NA 137 139 139  K 4.9 3.7 3.5  CL 107 108 108  CO2 21* 25 26  BUN 38* 31* 29*  CREATININE 1.69* 1.16* 1.19*  GLUCOSE 276* 134* 178*   Electrolytes  Recent Labs Lab 06/08/16 1237 06/09/16 0357 06/10/16 0524 06/11/16 0506  CALCIUM 9.5 8.9 8.5* 8.1*  MG 2.0  --  1.7 1.7  PHOS  --   --  2.7 3.2   Sepsis Markers  Recent Labs Lab 06/08/16 0948 06/08/16 2123 06/09/16 0015  LATICACIDVEN 5.6* 1.6 1.8   ABG No results for input(s): PHART, PCO2ART, PO2ART in the last 168 hours. Liver Enzymes  Recent Labs Lab 06/08/16 0638  AST 52*  ALT 19  ALKPHOS 65  BILITOT 1.4*  ALBUMIN 3.3*   Cardiac Enzymes  Recent Labs Lab 06/05/16 1728  TROPONINI 0.03*   Glucose  Recent Labs Lab 06/10/16 1112 06/10/16 1331 06/10/16 2121 06/10/16 2347 06/11/16 0332 06/11/16 0718  GLUCAP 180* 176* 123* 123* 149* 219*    Imaging Mr Brain Wo Contrast  Result Date: 06/10/2016 CLINICAL DATA:  Three day history of altered mental status. Fever. Diabetes. EXAM: MRI HEAD WITHOUT CONTRAST TECHNIQUE: Multiplanar, multiecho pulse sequences of the brain and surrounding structures were obtained without intravenous contrast. COMPARISON:  CT head 06/09/2016. FINDINGS: Brain: There is a tiny focus of restricted diffusion in the LEFT insula consistent with acute infarction. This measures only 2 or 3 mm in size. No other definite areas of restriction are seen. Global atrophy.  Hydrocephalus ex vacuo.  Widespread areas of chronic cerebral ischemia and chronic intracerebral hemorrhage, all likely sequelae of hypertensive cerebrovascular disease. Extensive T2 and FLAIR hyperintensities throughout the white matter representing chronic microvascular ischemic change. Vascular: The flow voids are maintained in the carotid, basilar, and both vertebral arteries. There are widespread areas of chronic hemorrhage throughout the brain, involving both cerebellar hemispheres in the deep white matter, LEFT external capsule and centrum semiovale as well as the RIGHT midbrain. These likely represent sequelae of bleeds secondary to hypertensive cerebral vascular disease. Skull and upper cervical spine: Pituitary and cerebellar tonsils are unremarkable. There is marked pannus contributing to narrowing at the craniocervical junction. Slight cord flattening at the C1 level probably not significant. Sinuses/Orbits: Layering LEFT maxillary sinus fluid suggesting acuity. Layering sphenoid sinus fluid as well. BILATERAL cataract extraction. Other: None IMPRESSION: Tiny focus of restricted diffusion LEFT insula consistent with acute infarction. This measures only 2-3 mm in size. Global atrophy, extensive small vessel disease, and numerous areas of chronic intracranial hemorrhage, likely sequelae of hypertensive cerebrovascular disease. Layering sphenoid and maxillary sinus fluid, suggesting acuity. Marked pannus surrounds the odontoid. Relative narrowing at the craniocervical junction. Electronically Signed   By: Elsie StainJohn T Curnes M.D.   On: 06/10/2016 16:02     DISCUSSION  82 yo white female with severe sepsis and septic shock from UTI with acute renal failure , severe metabolic acidosis, severe metabolic encephalopathy and small acute insula infarct on CT. Only residual effect is garbled speech.   ASSESSMENT / PLAN:  PULMONARY A: No acute issues P: Oxygen as needed  CARDIOVASCULAR A:  Septic shock-Resolved; BP  stable P IVF's/boluses prn to keep MAP>65 Sepsis protocol No need for CLV  RENAL A ARF from ATN-Creatinine trending down Hyperkalemia-resolved P: Follow chem 7 Foley to gravity Renally dose medications and avoid nephrotoxic drugs  GASTROINTESTINAL A: Dysphagia 2/2 CVA P: Keep NPO Aspiration precautions Swallow eval  INFECTIOUS A: Urosepsis secondary to UTI P: Monitor fever curse Continue Rocephin   ENDOCRINE A Uncontrolled  T2DM P: Insulin infusion discontinued. Continue blood glucose monitoring with   NEUROLOGIC A:  Acute CVA Acute encephalopathy secondary tos epsis P: SPEECH/SWALLOW evaluation PT/OT DISPOSITION AND FAMILY UPDATE: Plan of care discussed with patient's daughter at bedside  Patient is stable for transfer out of the unit. PCCM will sign-off to Hospitalist team. They are aware and already following patient.  Recall if necessary  I have personally obtained a history, examined the patient, evaluated Pertinent laboratory and [employer]/imaging results, and  formulated the assessment and plan   The Patient requires high complexity decision making for assessment and support, frequent evaluation and titration of therapies.    Lucie LeatherKurian David Forrest Jaroszewski, M.D.  [facility]  Medical Director [facility] Medical Director [facility]

## 2016-06-11 NOTE — Care Management Important Message (Signed)
Important Message  Patient Details  Name: Julie Burch MRN: [id number] Date of Birth: [birth year]   Medicare Important Message Given:  Yes    Kaedyn Belardo A, RN 06/11/2016, 4:06 PM

## 2016-06-11 NOTE — Progress Notes (Signed)
[employer] [facility] [place] [facility]   PATIENT NAME: Julie Burch    MR#:  [id number]  DATE OF BIRTH:  [birth year]  SUBJECTIVE:  CHIEF COMPLAINT:   Chief Complaint  Patient presents with  . Code Sepsis   -Patient more alert, speech is garbled, dry mouth - hard of hearing, denies any complaints  REVIEW OF SYSTEMS:  Review of Systems  Unable to perform ROS: Mental acuity    DRUG ALLERGIES:   Allergies  Allergen Reactions  . Aspirin Other (See Comments)    Reaction:  Intracranial hemorrhage   . Ciprofloxacin Other (See Comments)    Reaction:  Hallucinations/severe agitation    . Diovan [Valsartan] Other (See Comments)    Reaction:  High potassium levels   . Fosamax [Alendronate Sodium] Other (See Comments)    Reaction:  GI upset   . Sulfa Antibiotics Hives    VITALS:  Blood pressure (!) 142/55, pulse 67, temperature 98.8 F (37.1 C), resp. rate (!) 21, height 5\' 4"  (1.626 m), weight 52 kg (114 lb 10.2 oz), SpO2 99 %.  PHYSICAL EXAMINATION:  Physical Exam  GENERAL:  82 y.o.-year-old patient lying in the bed with no acute distress. Hard of hearing EYES: Pupils equal, round, reactive to light and accommodation. No scleral icterus. Extraocular muscles intact.  HEENT: Head atraumatic, normocephalic. Oropharynx and nasopharynx clear.  NECK:  Supple, no jugular venous distention. No thyroid enlargement, no tenderness.  LUNGS: Normal breath sounds bilaterally, no wheezing, rales,rhonchi or crepitation. No use of accessory muscles of respiration. Decreased bibasilar breath sounds. CARDIOVASCULAR: S1, S2 normal. No  rubs, or gallops. 3/6 systolic murmur is present. ABDOMEN: Soft, nontender, nondistended. Bowel sounds present. No organomegaly or mass.  EXTREMITIES: No pedal edema, cyanosis, or clubbing.  NEUROLOGIC: slurred speech, no other cranial nerve deficits Moving all extremities in bed, following simple commands PSYCHIATRIC: The patient is alert,  oriented x 1-2, following simple commands, hard of hearing though SKIN: No obvious rash, lesion, or ulcer.    LABORATORY PANEL:   CBC  Recent Labs Lab 06/11/16 0506  WBC 7.3  HGB 10.0*  HCT 29.3*  PLT 135*   ------------------------------------------------------------------------------------------------------------------  Chemistries   Recent Labs Lab 06/08/16 0638  06/11/16 0506  NA 128*  < > 139  K 6.5*  < > 3.5  CL 93*  < > 108  CO2 10*  < > 26  GLUCOSE 856*  < > 178*  BUN 46*  < > 29*  CREATININE 2.61*  < > 1.19*  CALCIUM 8.5*  < > 8.1*  MG  --   < > 1.7  AST 52*  --   --   ALT 19  --   --   ALKPHOS 65  --   --   BILITOT 1.4*  --   --   < > = values in this interval not displayed. ------------------------------------------------------------------------------------------------------------------  Cardiac Enzymes  Recent Labs Lab 06/05/16 1728  TROPONINI 0.03*   ------------------------------------------------------------------------------------------------------------------  RADIOLOGY:  Ct Head Wo Contrast  Result Date: 06/09/2016 CLINICAL DATA:  Acute encephalopathy.  Sepsis. EXAM: CT HEAD WITHOUT CONTRAST TECHNIQUE: Contiguous axial images were obtained from the base of the skull through the vertex without intravenous contrast. COMPARISON:  07/05/2016 FINDINGS: BRAIN: There is moderate sulcal and ventricular prominence consistent with superficial and central atrophy. No intraparenchymal hemorrhage, mass effect nor midline shift. There are chronic stable periventricular and subcortical areas of white matter hypoattenuation consistent with chronic small vessel ischemic disease. No acute large  vascular territory infarcts. No abnormal extra-axial fluid collections. Basal cisterns are patent. VASCULAR: Moderate calcific atherosclerosis of the carotid siphons. SKULL: No skull fracture. No significant scalp soft tissue swelling. SINUSES/ORBITS: The mastoid air-cells  are clear. Trace air-fluid levels in both maxillary sinuses are new since prior exam. Mild acute sinusitis is not excluded. Soft tissue densities consistent mucus noted in the coronaries left-greater-than-right. Mild ethmoid sinus mucosal thickening. Bilateral lens replacement surgical changes.The included ocular globes and orbital contents are non-suspicious. OTHER: None. IMPRESSION: No acute intracranial abnormality. Chronic moderate small vessel ischemic disease of periventricular and subcortical white matter. Cerebral atrophy. Acute maxillary sinusitis. Electronically Signed   By: Tollie Ethavid  Kwon M.D.   On: 06/09/2016 15:49   Mr Brain Wo Contrast  Result Date: 06/10/2016 CLINICAL DATA:  Three day history of altered mental status. Fever. Diabetes. EXAM: MRI HEAD WITHOUT CONTRAST TECHNIQUE: Multiplanar, multiecho pulse sequences of the brain and surrounding structures were obtained without intravenous contrast. COMPARISON:  CT head 06/09/2016. FINDINGS: Brain: There is a tiny focus of restricted diffusion in the LEFT insula consistent with acute infarction. This measures only 2 or 3 mm in size. No other definite areas of restriction are seen. Global atrophy.  Hydrocephalus ex vacuo. Widespread areas of chronic cerebral ischemia and chronic intracerebral hemorrhage, all likely sequelae of hypertensive cerebrovascular disease. Extensive T2 and FLAIR hyperintensities throughout the white matter representing chronic microvascular ischemic change. Vascular: The flow voids are maintained in the carotid, basilar, and both vertebral arteries. There are widespread areas of chronic hemorrhage throughout the brain, involving both cerebellar hemispheres in the deep white matter, LEFT external capsule and centrum semiovale as well as the RIGHT midbrain. These likely represent sequelae of bleeds secondary to hypertensive cerebral vascular disease. Skull and upper cervical spine: Pituitary and cerebellar tonsils are  unremarkable. There is marked pannus contributing to narrowing at the craniocervical junction. Slight cord flattening at the C1 level probably not significant. Sinuses/Orbits: Layering LEFT maxillary sinus fluid suggesting acuity. Layering sphenoid sinus fluid as well. BILATERAL cataract extraction. Other: None IMPRESSION: Tiny focus of restricted diffusion LEFT insula consistent with acute infarction. This measures only 2-3 mm in size. Global atrophy, extensive small vessel disease, and numerous areas of chronic intracranial hemorrhage, likely sequelae of hypertensive cerebrovascular disease. Layering sphenoid and maxillary sinus fluid, suggesting acuity. Marked pannus surrounds the odontoid. Relative narrowing at the craniocervical junction. Electronically Signed   By: Elsie StainJohn T Curnes M.D.   On: 06/10/2016 16:02    EKG:   Orders placed or performed during the hospital encounter of 06/08/16  . EKG 12-Lead  . EKG 12-Lead  . ED EKG 12-Lead  . ED EKG 12-Lead  . EKG 12-Lead  . EKG 12-Lead    ASSESSMENT AND PLAN:   82 year old female with past medical history significant for diabetes mellitus, GERD, depression, hypertension, history of stroke presents to hospital from assisted living facility secondary to altered mental status.  #1 altered mental status-metabolic encephalopathy secondary to sepsis and also acute CVA - MRI brain with tiny infract in left insular area, slurred speech this AM, no other deficits noted.. - swallow eval, keep NPO until then, PT, OT consults -check dopplers and ECHO - start asa and statin.  #2 septic shock-secondary to acute cystitis. -Urine cultures growing sensitive Escherichia coli. Continue IV Rocephin until able to take oral medications -Blood cultures are negative. Blood pressure is improving  #3 Diarrhea- loose stools since yesterday, also has low grade temps - c.diff toxin negative, but pcr positive. So will  treat with flagyl. Contact isolation Probiotics.  Discontinue IV ABX in 5-7 days  #4 acute renal failure-likely ATN from sepsis -Improving with fluids. Continue to monitor.  #4 diabetes mellitus-sugars were in 800s when she came in and was on insulin drip. Improved with fluids -On Lantus and sliding scale at this time. -A1c is pending  #5 hypertension- start oral norvasc and monitor  #6 DVT prophylaxis-on Lovenox  Physical therapy consult Transfer to floor today    All the records are reviewed and case discussed with Care Management/Social Workerr. Management plans discussed with the patient, family and they are in agreement.  CODE STATUS: DO NOT RESUSCITATE  TOTAL TIME TAKING CARE OF THIS PATIENT: 37 minutes.   POSSIBLE D/C IN 2-3 DAYS, DEPENDING ON CLINICAL CONDITION.   Enid Baas M.D on 06/11/2016 at 7:45 AM  Between 7am to 6pm - Pager - [phone]  After 6pm go to [email or web address] - Social research officer, government  [facility]  Office  [phone]  CC: Primary care physician; Mariam Dollar, MD

## 2016-06-11 NOTE — Progress Notes (Signed)
Pt transferred from CCU, resting comfortably in bed.  IV fluids not infusing and IV in right hand bleeding and leaking when flushed.  Foley in place and family at bedside.  Orson Apeanielle Chimamanda Siegfried, RN

## 2016-06-12 ENCOUNTER — Inpatient Hospital Stay
Admit: 2016-06-12 | Discharge: 2016-06-12 | Disposition: A | Discharge status: Home / Self Care | Payer: Medicare Other | Attending: Internal Medicine | Admitting: Internal Medicine

## 2016-06-12 DIAGNOSIS — I639 Cerebral infarction, unspecified: Secondary | ICD-10-CM

## 2016-06-12 DIAGNOSIS — G9341 Metabolic encephalopathy: Secondary | ICD-10-CM

## 2016-06-12 LAB — GLUCOSE, CAPILLARY
GLUCOSE-CAPILLARY: 131 mg/dL — AB (ref 65–99)
GLUCOSE-CAPILLARY: 143 mg/dL — AB (ref 65–99)
Glucose-Capillary: 106 mg/dL — ABNORMAL HIGH (ref 65–99)
Glucose-Capillary: 111 mg/dL — ABNORMAL HIGH (ref 65–99)
Glucose-Capillary: 120 mg/dL — ABNORMAL HIGH (ref 65–99)
Glucose-Capillary: 135 mg/dL — ABNORMAL HIGH (ref 65–99)
Glucose-Capillary: 144 mg/dL — ABNORMAL HIGH (ref 65–99)
Glucose-Capillary: 159 mg/dL — ABNORMAL HIGH (ref 65–99)
Glucose-Capillary: 97 mg/dL (ref 65–99)

## 2016-06-12 LAB — URINALYSIS COMPLETE WITH MICROSCOPIC (ARMC ONLY)
BILIRUBIN URINE: NEGATIVE
GLUCOSE, UA: NEGATIVE mg/dL
Ketones, ur: NEGATIVE mg/dL
NITRITE: NEGATIVE
Protein, ur: 30 mg/dL — AB
Specific Gravity, Urine: 1.02 (ref 1.005–1.030)
Squamous Epithelial / LPF: NONE SEEN
pH: 5 (ref 5.0–8.0)

## 2016-06-12 LAB — HEMOGLOBIN A1C
HEMOGLOBIN A1C: 8.9 % — AB (ref 4.8–5.6)
MEAN PLASMA GLUCOSE: 209 mg/dL

## 2016-06-12 MED ORDER — LEVOTHYROXINE SODIUM 50 MCG PO TABS
50.0000 ug | ORAL_TABLET | Freq: Every day | ORAL | Status: DC
  Administered 2016-06-13 – 2016-06-15 (×3): 50 ug via ORAL
  Filled 2016-06-12 (×3): qty 1

## 2016-06-12 MED ORDER — SERTRALINE HCL 50 MG PO TABS
100.0000 mg | ORAL_TABLET | Freq: Once | ORAL | Status: AC
  Administered 2016-06-12: 16:00:00 100 mg via ORAL
  Filled 2016-06-12: qty 2

## 2016-06-12 MED ORDER — RISPERIDONE 0.5 MG PO TBDP
0.2500 mg | ORAL_TABLET | Freq: Once | ORAL | Status: AC
  Administered 2016-06-12: 0.25 mg via ORAL
  Filled 2016-06-12: qty 1

## 2016-06-12 MED ORDER — DONEPEZIL HCL 5 MG PO TABS
5.0000 mg | ORAL_TABLET | Freq: Every day | ORAL | Status: DC
  Administered 2016-06-12 – 2016-06-14 (×3): 5 mg via ORAL
  Filled 2016-06-12 (×3): qty 1

## 2016-06-12 MED ORDER — CEPHALEXIN 250 MG PO CAPS
250.0000 mg | ORAL_CAPSULE | Freq: Three times a day (TID) | ORAL | Status: DC
  Administered 2016-06-13 – 2016-06-15 (×7): 250 mg via ORAL
  Filled 2016-06-12 (×9): qty 1

## 2016-06-12 MED ORDER — SERTRALINE HCL 50 MG PO TABS
100.0000 mg | ORAL_TABLET | Freq: Every day | ORAL | Status: DC
  Administered 2016-06-13 – 2016-06-15 (×3): 100 mg via ORAL
  Filled 2016-06-12 (×3): qty 2

## 2016-06-12 MED ORDER — HYDRALAZINE HCL 25 MG PO TABS
25.0000 mg | ORAL_TABLET | Freq: Three times a day (TID) | ORAL | Status: DC
  Administered 2016-06-13 – 2016-06-15 (×5): 25 mg via ORAL
  Filled 2016-06-12 (×6): qty 1

## 2016-06-12 MED ORDER — AMLODIPINE BESYLATE 5 MG PO TABS
5.0000 mg | ORAL_TABLET | Freq: Every day | ORAL | Status: DC
  Administered 2016-06-12 – 2016-06-14 (×3): 5 mg via ORAL
  Filled 2016-06-12 (×3): qty 1

## 2016-06-12 MED ORDER — INSULIN ASPART 100 UNIT/ML ~~LOC~~ SOLN
0.0000 [IU] | Freq: Three times a day (TID) | SUBCUTANEOUS | Status: DC
  Administered 2016-06-13: 09:00:00 1 [IU] via SUBCUTANEOUS
  Administered 2016-06-14: 13:00:00 3 [IU] via SUBCUTANEOUS
  Administered 2016-06-14 (×2): 2 [IU] via SUBCUTANEOUS
  Administered 2016-06-15: 7 [IU] via SUBCUTANEOUS
  Filled 2016-06-12: qty 7
  Filled 2016-06-12: qty 3
  Filled 2016-06-12: qty 2
  Filled 2016-06-12: qty 1
  Filled 2016-06-12: qty 2

## 2016-06-12 MED ORDER — INSULIN ASPART 100 UNIT/ML ~~LOC~~ SOLN
0.0000 [IU] | Freq: Every day | SUBCUTANEOUS | Status: DC
  Administered 2016-06-14: 23:00:00 3 [IU] via SUBCUTANEOUS
  Filled 2016-06-12: qty 3

## 2016-06-12 NOTE — Progress Notes (Signed)
*  PRELIMINARY RESULTS* Echocardiogram 2D Echocardiogram has been performed.  Cristela BlueHege, Yarenis Cerino 06/12/2016, 2:15 PM

## 2016-06-12 NOTE — Progress Notes (Signed)
Physical Therapy Treatment Patient Details Name: Julie DikeMary Burch MRN: [id number] DOB: 03/06/1927 Today's Date: 06/12/2016    History of Present Illness Pt is an 82 y/o female that presents with AMS, found to have septic shock from recurrent UTI. MRI showed L insular infarct, chronic intracranial hemorrhages.     PT Comments    Pt presented lethargic however agreeable to attempt transfer to chair for lunch. NT in room 2/2 episode of incontinence. Pt required max cues for rolling side to side with hand over hand cues for holding onto bed rail. HOB elevated and pt able to provide some assistance for supine to sit transfer however continued to require maxA for LE placement and trunk support. Pt's dgt present and indicated squat pivot transfer normally done to L with dgt providing support from posterior/lateral aspect. Pt able to perform squat pivot transfer with maxA .  Pt left in chair with dgt present to assist with feeding lunch.   Follow Up Recommendations  SNF     Equipment Recommendations       Recommendations for Other Services       Precautions / Restrictions Restrictions Weight Bearing Restrictions: No    Mobility  Bed Mobility Overal bed mobility: Needs Assistance Bed Mobility: Rolling;Supine to Sit Rolling: Max assist   Supine to sit: Max assist     General bed mobility comments: Verbal cues for holding onto rails for rolling  Transfers Overall transfer level: Needs assistance Equipment used: 2 person hand held assist Transfers: Squat Pivot Transfers Sit to Stand: Max assist         General transfer comment: Squat pivot transfer to L  Ambulation/Gait                 Stairs            Wheelchair Mobility    Modified Rankin (Stroke Patients Only)       Balance Overall balance assessment: Needs assistance Sitting-balance support: Feet supported Sitting balance-Leahy Scale: Poor   Postural control: Right lateral lean                           Cognition Arousal/Alertness: Lethargic Behavior During Therapy: WFL for tasks assessed/performed Overall Cognitive Status: History of cognitive impairments - at baseline                      Exercises      General Comments        Pertinent Vitals/Pain Pain Assessment: No/denies pain    Home Living                      Prior Function            PT Goals (current goals can now be found in the care plan section)      Frequency    7X/week      PT Plan      Co-evaluation             End of Session   Activity Tolerance: Patient tolerated treatment well Patient left: in chair;with family/visitor present     Time: [id number] PT Time Calculation (min) (ACUTE ONLY): 26 min  Charges:  $Therapeutic Activity: 23-37 mins                    G Codes:      Ajia Chadderdon  Kedric Bumgarner, PTA 06/12/2016, 12:20 PM

## 2016-06-12 NOTE — Progress Notes (Signed)
PT Cancellation Note  Patient Details Name: Julie Burch MRN: [id number] DOB: [birth year]   Cancelled Treatment:    Reason Eval/Treat Not Completed: Fatigue/lethargy limiting ability to participate. Dgt in room request therapy to come back later as pt has increased agitation this am. Will re-attempt in pm as able.    Verner Kopischke 06/12/2016, 9:56 AM

## 2016-06-12 NOTE — Consult Note (Signed)
Referring Physician: Nemiah CommanderKalisetti    Chief Complaint: Altered mental status  HPI: Julie Burch is an 82 y.o. female admitted with sepsis.  Has not returned to baseline mental status as expected.  Further work up performed for possibility of co,plicating factors other than metabolic/toxic encephalopathy.  Family reports that they have noticed slurred speech particularly when the patient is tired.  Patient fatigues easily as well.    Date last known well: Unable to determine Time last known well: Unable to determine tPA Given: No: Unable to determine LKW  Past Medical History:  Diagnosis Date  . Depression   . Diabetes mellitus without complication (HCC)   . GERD (gastroesophageal reflux disease)   . Hypertension   . Recurrent UTI   . Stroke [facility](HCC)    TIA    Past Surgical History:  Procedure Laterality Date  . TONSILLECTOMY      Family History  Problem Relation Age of Onset  . Family history unknown: Yes   Social History:  reports that she has quit smoking. She has never used smokeless tobacco. She reports that she does not drink alcohol or use drugs.  Allergies:  Allergies  Allergen Reactions  . Aspirin Other (See Comments)    Reaction:  Intracranial hemorrhage   . Ciprofloxacin Other (See Comments)    Reaction:  Hallucinations/severe agitation    . Diovan [Valsartan] Other (See Comments)    Reaction:  High potassium levels   . Fosamax [Alendronate Sodium] Other (See Comments)    Reaction:  GI upset   . Sulfa Antibiotics Hives    Medications:  I have reviewed the patient's current medications. Prior to Admission:  Prescriptions Prior to Admission  Medication Sig Dispense Refill Last Dose  . acetaminophen (TYLENOL) 500 MG tablet Take 1,000 mg by mouth every 4 (four) hours as needed for mild pain or headache.   prn at prn  . amLODipine (NORVASC) 2.5 MG tablet Take 2.5 mg by mouth daily.   06/07/2016 at 0900  . cephALEXin (KEFLEX) 500 MG capsule Take 1 capsule (500 mg  total) by mouth 3 (three) times daily. 15 capsule 0 06/07/2016 at 1500  . cholecalciferol (VITAMIN D) 1000 units tablet Take 2,000 Units by mouth daily.   06/07/2016 at 0900  . Coenzyme Q10 (COQ-10) 100 MG CAPS Take 100 mg by mouth daily.   06/07/2016 at 0900  . donepezil (ARICEPT) 5 MG tablet Take 5 mg by mouth at bedtime.   06/07/2016 at 2100  . esomeprazole (NEXIUM) 40 MG capsule Take 40 mg by mouth daily before breakfast.   06/07/2016 at 0600  . fosfomycin (MONUROL) 3 g PACK Take 3 g by mouth See admin instructions. Pt is to take once every ten days.   05/29/2016 at 0900  . glucose 4 GM chewable tablet Chew 4 tablets by mouth as needed (for blood sugar less than 80).   prn at prn  . hydrALAZINE (APRESOLINE) 50 MG tablet Take 50 mg by mouth 2 (two) times daily.   06/07/2016 at 2100  . insulin glargine (LANTUS) 100 UNIT/ML injection Inject 17 Units into the skin at bedtime.    06/07/2016 at 2100  . insulin lispro (HUMALOG) 100 UNIT/ML injection Inject 3-5 Units into the skin 3 (three) times daily with meals. Pt injects 5 units before breakfast at 0900, 5 units before lunch at 1130, and 3 units before dinner at 1700.   06/07/2016 at 1130  . levothyroxine (SYNTHROID, LEVOTHROID) 50 MCG tablet Take 50 mcg by mouth daily  before breakfast.   06/07/2016 at 0700  . losartan (COZAAR) 25 MG tablet Take 25 mg by mouth daily.   06/07/2016 at 0900  . ondansetron (ZOFRAN) 4 MG tablet Take 4 mg by mouth every 8 (eight) hours as needed for nausea or vomiting.   prn at prn  . QUEtiapine (SEROQUEL) 50 MG tablet Take 150 mg by mouth at bedtime.    06/07/2016 at 2100  . sertraline (ZOLOFT) 100 MG tablet Take 150 mg by mouth daily.   06/07/2016 at 0900  . vitamin B-12 (CYANOCOBALAMIN) 1000 MCG tablet Take 1,000 mcg by mouth daily.   06/07/2016 at 0900   Scheduled: . acidophilus  1 capsule Oral BID  . amLODipine  5 mg Oral Daily  . atorvastatin  40 mg Oral q1800  . cefTRIAXone (ROCEPHIN)  IV  2 g Intravenous Q24H   . enoxaparin (LOVENOX) injection  30 mg Subcutaneous Q24H  . famotidine (PEPCID) IV  20 mg Intravenous Q48H  . insulin aspart  0-9 Units Subcutaneous Q4H  . insulin glargine  10 Units Subcutaneous QHS  . mouth rinse  15 mL Mouth Rinse BID  . metroNIDAZOLE  500 mg Oral Q8H  . QUEtiapine  50 mg Oral QHS  . sodium chloride flush  3 mL Intravenous Q12H    ROS: History obtained from the patient  General ROS:  fatigue Psychological ROS: negative for - behavioral disorder, hallucinations, memory difficulties, mood swings or suicidal ideation Ophthalmic ROS: negative for - blurry vision, double vision, eye pain or loss of vision ENT ROS: negative for - epistaxis, nasal discharge, oral lesions, sore throat, tinnitus or vertigo Allergy and Immunology ROS: negative for - hives or itchy/watery eyes Hematological and Lymphatic ROS: negative for - bleeding problems, bruising or swollen lymph nodes Endocrine ROS: negative for - galactorrhea, hair pattern changes, polydipsia/polyuria or temperature intolerance Respiratory ROS: negative for - cough, hemoptysis, shortness of breath or wheezing Cardiovascular ROS: negative for - chest pain, dyspnea on exertion, edema or irregular heartbeat Gastrointestinal ROS: negative for - abdominal pain, diarrhea, hematemesis, nausea/vomiting or stool incontinence Genito-Urinary ROS: negative for - dysuria, hematuria, incontinence or urinary frequency/urgency Musculoskeletal ROS: negative for - joint swelling or muscular weakness Neurological ROS: as noted in HPI Dermatological ROS: negative for rash and skin lesion changes  Physical Examination: Blood pressure 115/63, pulse 71, temperature 98.3 F (36.8 C), temperature source Oral, resp. rate (!) 22, height 5\' 4"  (1.626 m), weight 58 kg (127 lb 12.8 oz), SpO2 98 %.  Gen-NAD HEENT-  Normocephalic, left forehead hematoma.  Normal external eye and conjunctiva.  Normal TM's bilaterally.  Normal auditory canals and  external ears. Normal external nose, mucus membranes and septum.  Normal pharynx. Cardiovascular- S1, S2 normal, pulses palpable throughout   Lungs- chest clear, no wheezing, rales, normal symmetric air entry Abdomen- soft, non-tender; bowel sounds normal; no masses,  no organomegaly Extremities- no edema Lymph-no adenopathy palpable Musculoskeletal-no joint tenderness, deformity or swelling Skin-warm and dry, no hyperpigmentation, vitiligo, or suspicious lesions  Neurological Examination Mental Status: Alert.  Speech dysarthric.  Difficult to communicate with to determine how oriented.  Follows simple commands.   Cranial Nerves: II: Discs flat bilaterally; Visual fields grossly normal, pupils reactive  III,IV, VI: left ptosis, extra-ocular motions intact bilaterally V,VII: smile symmetric, facial light touch sensation normal bilaterally VIII: hearing decreasedl bilaterally IX,X: gag reflex present XI: bilateral shoulder shrug XII: midline tongue extension Motor: Generalized weakness.  Moves all extremities spontaneously against gravity.  No focal weakness noted.  Sensory: Pinprick and light touch intact throughout, bilaterally Deep Tendon Reflexes: 2+ and symmetric with absent AJ's bilaterally Plantars: Right: upgoing   Left: upgoing Cerebellar: Unable to perform due to fatigue Gait: not tested due to safety concerns   Laboratory Studies:  Basic Metabolic Panel:  Recent Labs Lab 06/08/16 0638 06/08/16 1237 06/09/16 0357 06/10/16 0524 06/11/16 0506  NA 128* 136 137 139 139  K 6.5* 4.0 4.9 3.7 3.5  CL 93* 104 107 108 108  CO2 10* 18* 21* 25 26  GLUCOSE 856* 463* 276* 134* 178*  BUN 46* 43* 38* 31* 29*  CREATININE 2.61* 2.48* 1.69* 1.16* 1.19*  CALCIUM 8.5* 9.5 8.9 8.5* 8.1*  MG  --  2.0  --  1.7 1.7  PHOS  --   --   --  2.7 3.2    Liver Function Tests:  Recent Labs Lab 06/08/16 0638  AST 52*  ALT 19  ALKPHOS 65  BILITOT 1.4*  PROT 6.1*  ALBUMIN 3.3*   No  results for input(s): LIPASE, AMYLASE in the last 168 hours.  Recent Labs Lab 06/10/16 0524  AMMONIA 16    CBC:  Recent Labs Lab 06/05/16 1728 06/08/16 0638 06/09/16 0357 06/10/16 0524 06/11/16 0506  WBC 6.4 11.5* 16.1* 13.9* 7.3  NEUTROABS 4.4 9.5*  --   --   --   HGB 12.5 10.8* 11.0* 10.4* 10.0*  HCT 37.6 36.0 33.4* 30.7* 29.3*  MCV 93.1 104.0* 92.8 91.9 94.8  PLT 167 183 117* 148* 135*    Cardiac Enzymes:  Recent Labs Lab 06/05/16 1728  TROPONINI 0.03*    BNP: Invalid input(s): POCBNP  CBG:  Recent Labs Lab 06/12/16 0009 06/12/16 0440 06/12/16 0520 06/12/16 0735 06/12/16 1157  GLUCAP 106* 143* 131* 144* 159*    Microbiology: Results for orders placed or performed during the hospital encounter of 06/08/16  Blood Culture (routine x 2)     Status: None (Preliminary result)   Collection Time: 06/08/16  6:38 AM  Result Value Ref Range Status   Specimen Description BLOOD  L WRIST  Final   Special Requests   Final    BOTTLES DRAWN AEROBIC AND ANAEROBIC  AER 10 ML ANA 10 ML   Culture NO GROWTH 4 DAYS  Final   Report Status PENDING  Incomplete  Blood Culture (routine x 2)     Status: None (Preliminary result)   Collection Time: 06/08/16  6:38 AM  Result Value Ref Range Status   Specimen Description BLOOD  R HAND  Final   Special Requests   Final    BOTTLES DRAWN AEROBIC AND ANAEROBIC  AER 4 ML ANA 3 ML   Culture NO GROWTH 4 DAYS  Final   Report Status PENDING  Incomplete  Urine culture     Status: None   Collection Time: 06/08/16  6:38 AM  Result Value Ref Range Status   Specimen Description URINE, RANDOM  Final   Special Requests NONE  Final   Culture NO GROWTH Performed at [facility]   Final   Report Status 06/09/2016 FINAL  Final  MRSA PCR Screening     Status: None   Collection Time: 06/08/16  6:33 PM  Result Value Ref Range Status   MRSA by PCR NEGATIVE NEGATIVE Final    Comment:        The GeneXpert MRSA Assay (FDA approved for  NASAL specimens only), is one component of a comprehensive MRSA colonization surveillance program. It is not intended to  diagnose MRSA infection nor to guide or monitor treatment for MRSA infections.   C difficile quick scan w PCR reflex     Status: Abnormal   Collection Time: 06/10/16  2:17 PM  Result Value Ref Range Status   C Diff antigen POSITIVE (A) NEGATIVE Final   C Diff toxin NEGATIVE NEGATIVE Final   C Diff interpretation Results are indeterminate. See PCR results.  Final  Clostridium Difficile by PCR     Status: Abnormal   Collection Time: 06/10/16  2:17 PM  Result Value Ref Range Status   Toxigenic C Difficile by pcr POSITIVE (A) NEGATIVE Final    Comment: Positive for toxigenic C. difficile with little to no toxin production. Only treat if clinical presentation suggests symptomatic illness.    Coagulation Studies: No results for input(s): LABPROT, INR in the last 72 hours.  Urinalysis:  Recent Labs Lab 06/08/16 0638 06/11/16 1220  COLORURINE YELLOW* YELLOW*  LABSPEC 1.020 1.020  PHURINE 5.0 5.0  GLUCOSEU >500* NEGATIVE  HGBUR NEGATIVE 1+*  BILIRUBINUR NEGATIVE NEGATIVE  KETONESUR TRACE* NEGATIVE  PROTEINUR 100* 30*  NITRITE NEGATIVE NEGATIVE  LEUKOCYTESUR 1+* 1+*    Lipid Panel:    Component Value Date/Time   CHOL 171 06/11/2016 0506   TRIG 74 06/11/2016 0506   HDL 51 06/11/2016 0506   CHOLHDL 3.4 06/11/2016 0506   VLDL 15 06/11/2016 0506   LDLCALC 105 (H) 06/11/2016 0506    HgbA1C:  Lab Results  Component Value Date   HGBA1C 8.9 (H) 06/11/2016    Urine Drug Screen:  No results found for: LABOPIA, COCAINSCRNUR, LABBENZ, AMPHETMU, THCU, LABBARB  Alcohol Level: No results for input(s): ETH in the last 168 hours.  Other results: EKG: atrial flutter, rate 124bpm.  Imaging: Mr Brain Wo Contrast  Result Date: 06/10/2016 CLINICAL DATA:  Three day history of altered mental status. Fever. Diabetes. EXAM: MRI HEAD WITHOUT CONTRAST TECHNIQUE:  Multiplanar, multiecho pulse sequences of the brain and surrounding structures were obtained without intravenous contrast. COMPARISON:  CT head 06/09/2016. FINDINGS: Brain: There is a tiny focus of restricted diffusion in the LEFT insula consistent with acute infarction. This measures only 2 or 3 mm in size. No other definite areas of restriction are seen. Global atrophy.  Hydrocephalus ex vacuo. Widespread areas of chronic cerebral ischemia and chronic intracerebral hemorrhage, all likely sequelae of hypertensive cerebrovascular disease. Extensive T2 and FLAIR hyperintensities throughout the white matter representing chronic microvascular ischemic change. Vascular: The flow voids are maintained in the carotid, basilar, and both vertebral arteries. There are widespread areas of chronic hemorrhage throughout the brain, involving both cerebellar hemispheres in the deep white matter, LEFT external capsule and centrum semiovale as well as the RIGHT midbrain. These likely represent sequelae of bleeds secondary to hypertensive cerebral vascular disease. Skull and upper cervical spine: Pituitary and cerebellar tonsils are unremarkable. There is marked pannus contributing to narrowing at the craniocervical junction. Slight cord flattening at the C1 level probably not significant. Sinuses/Orbits: Layering LEFT maxillary sinus fluid suggesting acuity. Layering sphenoid sinus fluid as well. BILATERAL cataract extraction. Other: None IMPRESSION: Tiny focus of restricted diffusion LEFT insula consistent with acute infarction. This measures only 2-3 mm in size. Global atrophy, extensive small vessel disease, and numerous areas of chronic intracranial hemorrhage, likely sequelae of hypertensive cerebrovascular disease. Layering sphenoid and maxillary sinus fluid, suggesting acuity. Marked pannus surrounds the odontoid. Relative narrowing at the craniocervical junction. Electronically Signed   By: Elsie StainJohn T Curnes M.D.   On:  06/10/2016 16:02  US Carotid Bilateral  Result Date: 06/11/2016 CLINICAL DATA:  Slurred speech, altered mental status, acute stroke symptoms EXAM: BILATERAL CAROTID DUPLEX ULTRASOUND TECHNIQUE: Wallace Cullens scale imaging, color Doppler and duplex ultrasound were performed of bilateral carotid and vertebral arteries in the neck. COMPARISON:  06/10/2016 brain MRI FINDINGS: Criteria: Quantification of carotid stenosis is based on velocity parameters that correlate the residual internal carotid diameter with NASCET-based stenosis levels, using the diameter of the distal internal carotid lumen as the denominator for stenosis measurement. The following velocity measurements were obtained: RIGHT ICA:  131/22 cm/sec CCA:  73/13 cm/sec SYSTOLIC ICA/CCA RATIO:  1.79 DIASTOLIC ICA/CCA RATIO:  1.71 ECA:  417 cm/sec LEFT ICA:  204/33 cm/sec CCA:  116/24 cm/sec SYSTOLIC ICA/CCA RATIO:  1.76 DIASTOLIC ICA/CCA RATIO:  1.36 ECA:  135 cm/sec RIGHT CAROTID ARTERY: Moderately severe partially calcified right carotid bifurcation atherosclerosis. This significantly narrows the lumen of the right ECA with an elevated ECA origin velocity as above. Despite this, the right ICA remains patent with minor plaque formation. No hemodynamically significant right ICA stenosis, velocity elevation, or turbulent flow. Degree of right ICA narrowing is less than 50%. RIGHT VERTEBRAL ARTERY:  Antegrade LEFT CAROTID ARTERY: Similar moderate heterogeneous and partially calcified left carotid bifurcation atherosclerosis. Tortuous left ICA evident. No significant luminal narrowing by grayscale imaging. Tortuosity appears to result in an elevated velocity left ICA velocity measuring 204/32 cm/sec. Degree of stenosis still estimated less than 50%. LEFT VERTEBRAL ARTERY:  Antegrade IMPRESSION: Moderate bilateral carotid atherosclerosis and tortuosity, worse on the left. Tortuosity appears result in left ICA velocity elevation. Degree of ICA stenosis estimated less  than 50% bilaterally. Patent antegrade vertebral flow bilaterally Electronically Signed   By: Judie Petit.  Shick M.D.   On: 06/11/2016 11:42    Assessment: 82 y.o. female with encephalopathy.  MRI of the brain reviewed and shows a small left insular acute infarct.  Patient with atrial flutter on EKG.  Infarct likely embolic.  Patient on no antiplatelet therapy at home.  Has had frequent recent falls and therefore is not an anticoagulation candidate.  Infarct very small and not likely the cause of encephalopathy.  This is likely secondary to her presenting sepsis.  Carotid dopplers show moderate stenosis bilaterally.  Echocardiogram pending.  A1c 8.9, LDL 105.  Stroke Risk Factors - diabetes mellitus and hypertension  Plan: 1. Improved control of blood sugar 2. PT consult, OT consult, Speech consult 3. Prophylactic therapy-Antiplatelet med: Plavix - dose 75mg  daily.  Patient with allergy to ASA.   4. Telemetry monitoring 5. Frequent neuro checks 6. Statin for lipid management with target LDL<70. 7. Will not further work up carotid stenosis since patient not interested in any interventional measures at this time.      Thana Farr, MD Neurology [phone] 06/12/2016, 12:37 PM

## 2016-06-12 NOTE — Progress Notes (Addendum)
Pt has been irritable and agitated for most of the day.  Pt asking over and over to go home.  Per family, pt has not slept well the last 24 hours.  Pt's daughter trying to leave for a few hours to go home for a shower however pt continuously trying to get out of bed to leave with her daughter.  Per family request, Dr. Nemiah CommanderKalisetti paged for medication is calm pt and help her get some sleep.  New orders received.  Orson Apeanielle Timberlee Roblero, RN

## 2016-06-12 NOTE — Evaluation (Addendum)
Clinical/Bedside Swallow Evaluation Patient Details  Name: Julie DikeMary Burch MRN: [id number] Date of Birth: [birth year]  Today's Date: 06/12/2016 Time: SLP Start Time (ACUTE ONLY): 0815 SLP Stop Time (ACUTE ONLY): 0915 SLP Time Calculation (min) (ACUTE ONLY): 60 min  Past Medical History:  Past Medical History:  Diagnosis Date  . Depression   . Diabetes mellitus without complication (HCC)   . GERD (gastroesophageal reflux disease)   . Hypertension   . Recurrent UTI   . Stroke [facility](HCC)    TIA   Past Surgical History:  Past Surgical History:  Procedure Laterality Date  . TONSILLECTOMY     HPI: Pt is an 82 y/o female who has a past medical history of Cognitive decline per report; GERD; Depression; Diabetes mellitus without complication (HCC); GERD (gastroesophageal reflux disease); Hypertension; Recurrent UTI; and Stroke (HCC). She was recently treated 2 days ago for UTI; now admitted.      Assessment / Plan / Recommendation Clinical Impression  Pt appears at min increased risk for aspiration moreso w/ thin liquids, however, this may be a baseline presentation of pt's as per Daughter's description. Daughter endorsed episodes of pt "coughing at home during meals and when drinking liquids" for some time but denied any Pulmonary deficits from such. But d/t pt's new change in status secondary to small Left infarct, hospitalization(more sedentary), and baseline Dementia/Cognitive decline(Global atrophy, extensive small vessel disease, and numerous areas of chronic intracranial hemorrhage), pt is at increased risk for dysphagia w/ aspiration and sequelae of such including Pneumonia. Oral phase c/b slower bolus manipulation w/ increased texture; noted slower lingual movements during tasks and speech as well, but pt was able to clear orally given time. Pt would benefit from a modified dsyphagia diet of Dysphagia 1 w/ thin liquids w/ strict aspiration precautions to monitor need for change of liquid  consistency to Nectar consistency. This was explained to Daughter present; education discussed on aspiration precautions and diet recommendations.      Aspiration Risk  Mild aspiration risk - moderate aspiration risk   Diet Recommendation  Dysphagia 1 w/ thin liquids - cup only, NO Straws.  Strict aspiration precautions and monitoring during all meals.  Medication Administration: Whole meds with puree (crush if needed for safer swallowing and if able to)    Other  Recommendations Recommended Consults:  (Dietician consult) Oral Care Recommendations: Oral care BID;Staff/trained caregiver to provide oral care   Follow up Recommendations Skilled Nursing facility (TBD)      Frequency and Duration min 3x week  2 weeks       Prognosis Prognosis for Safe Diet Advancement: Fair Barriers to Reach Goals: Cognitive deficits      Swallow Study   General Date of Onset: 06/08/16 Type of Study: Bedside Swallow Evaluation Previous Swallow Assessment: none indicated, although, pt's Daughter indicated baseline s/s of dysphagia Diet Prior to this Study: Thin liquids;Regular Temperature Spikes Noted: No (wbc 7.3) Respiratory Status: Room air History of Recent Intubation: No Behavior/Cognition: Cooperative;Confused;Distractible;Requires cueing (awake - eyes closed often) Oral Cavity Assessment: Dry;Within Functional Limits (grossly wfl but difficult to assess d/t Cognition) Oral Care Completed by SLP: Recent completion by staff Oral Cavity - Dentition: Dentures, top (bottom dentition w/ partial) Vision:  (n/a) Self-Feeding Abilities: Total assist;Needs set up;Needs assist Patient Positioning: Upright in bed Baseline Vocal Quality: Low vocal intensity (slower speech; Dysarthria) Volitional Cough: Strong;Congested (min) Volitional Swallow: Able to elicit    Oral/Motor/Sensory Function Overall Oral Motor/Sensory Function: Mild impairment Facial ROM: Within Functional Limits Facial Symmetry:  Within Functional Limits Lingual ROM: Within Functional Limits Lingual Symmetry: Within Functional Limits Lingual Strength: Reduced;Suspected CN XII (hypoglossal) dysfunction (slower movements) Velum: Within Functional Limits (grossly) Mandible: Within Functional Limits   Ice Chips Ice chips: Within functional limits Presentation: Spoon (fed; 3 trials)   Thin Liquid Thin Liquid: Impaired Presentation: Cup;Self Fed (assisted; 10 trials total) Oral Phase Impairments:  (none) Oral Phase Functional Implications:  (none) Pharyngeal  Phase Impairments: Suspected delayed Swallow;Multiple swallows;Cough - Delayed (x2; audible swallows) Other Comments: Pt's Daughter indicated this was baseline for pt to have "some coughing" - this has been apparent for "some time now" per Daughter. But she denied any Pulmonary deficits or illness in the past 6 months.    Nectar Thick Nectar Thick Liquid: Not tested   Honey Thick Honey Thick Liquid: Not tested   Puree Puree: Within functional limits Presentation: Spoon (fed; 6 trials)   Solid   GO   Solid: Not tested Other Comments: d/t Cognitive status; fatigue and weakness         Jerilynn SomKatherine Hero Mccathern, MS, CCC-SLP Everlie Eble 06/12/2016,12:58 PM

## 2016-06-12 NOTE — Progress Notes (Signed)
OT Cancellation Note  Patient Details Name: Julie Burch MRN: [id number] DOB: 1927-02-08   Cancelled Treatment:    Reason Eval/Treat Not Completed: Fatigue/lethargy limiting ability to participate. Pt lethargic and not able to participate in evaluation.  Julie Burch were in the room and explained that she was agitated today due to not sleeping well last night.  Will attempt again tomorrow am. Thank you for the referral.  [name], OTR/L ascom [phone] 06/12/16, 3:56 PM

## 2016-06-12 NOTE — Progress Notes (Signed)
[employer] [facility] -  at [facility]   PATIENT NAME: Julie Burch    MR#:  [id number]  DATE OF BIRTH:  [birth year]  SUBJECTIVE:  CHIEF COMPLAINT:   Chief Complaint  Patient presents with  . Code Sepsis   -Patient more alert, communicating better, though seems irritable today and wants to go home. - daughter at bedside -passed swallow eval today  REVIEW OF SYSTEMS:  Review of Systems  Constitutional: Positive for malaise/fatigue. Negative for chills and fever.  HENT: Positive for hearing loss. Negative for congestion, ear discharge and nosebleeds.   Eyes: Negative for blurred vision and double vision.  Respiratory: Negative for cough, shortness of breath and wheezing.   Cardiovascular: Negative for chest pain, palpitations and leg swelling.  Gastrointestinal: Negative for abdominal pain, constipation, diarrhea, nausea and vomiting.  Genitourinary: Negative for dysuria.  Musculoskeletal: Negative for myalgias.  Neurological: Positive for speech change and focal weakness. Negative for dizziness, sensory change, seizures and headaches.    DRUG ALLERGIES:   Allergies  Allergen Reactions  . Aspirin Other (See Comments)    Reaction:  Intracranial hemorrhage   . Ciprofloxacin Other (See Comments)    Reaction:  Hallucinations/severe agitation    . Diovan [Valsartan] Other (See Comments)    Reaction:  High potassium levels   . Fosamax [Alendronate Sodium] Other (See Comments)    Reaction:  GI upset   . Sulfa Antibiotics Hives    VITALS:  Blood pressure 115/63, pulse 71, temperature 98.3 F (36.8 C), temperature source Oral, resp. rate (!) 22, height 5\' 4"  (1.626 m), weight 58 kg (127 lb 12.8 oz), SpO2 98 %.  PHYSICAL EXAMINATION:  Physical Exam  GENERAL:  82 y.o.-year-old patient lying in the bed with no acute distress. Hard of hearing EYES: Pupils equal, round, reactive to light and accommodation. No scleral icterus. Extraocular muscles intact.  HEENT:  Head atraumatic, normocephalic. Oropharynx and nasopharynx clear.  NECK:  Supple, no jugular venous distention. No thyroid enlargement, no tenderness.  LUNGS: Normal breath sounds bilaterally, no wheezing, rales,rhonchi or crepitation. No use of accessory muscles of respiration. Decreased bibasilar breath sounds. CARDIOVASCULAR: S1, S2 normal. No  rubs, or gallops. 3/6 systolic murmur is present. ABDOMEN: Soft, nontender, nondistended. Bowel sounds present. No organomegaly or mass.  EXTREMITIES: No pedal edema, cyanosis, or clubbing.  NEUROLOGIC: slurred speech, no other cranial nerve deficits, communicating better Moving all extremities in bed- slightly weaker on right side when worked with PT yesterday, following simple commands PSYCHIATRIC: The patient is alert, oriented x 1-2, following simple commands, hard of hearing though SKIN: No obvious rash, lesion, or ulcer.    LABORATORY PANEL:   CBC  Recent Labs Lab 06/11/16 0506  WBC 7.3  HGB 10.0*  HCT 29.3*  PLT 135*   ------------------------------------------------------------------------------------------------------------------  Chemistries   Recent Labs Lab 06/08/16 0638  06/11/16 0506  NA 128*  < > 139  K 6.5*  < > 3.5  CL 93*  < > 108  CO2 10*  < > 26  GLUCOSE 856*  < > 178*  BUN 46*  < > 29*  CREATININE 2.61*  < > 1.19*  CALCIUM 8.5*  < > 8.1*  MG  --   < > 1.7  AST 52*  --   --   ALT 19  --   --   ALKPHOS 65  --   --   BILITOT 1.4*  --   --   < > = values in this interval not  displayed. ------------------------------------------------------------------------------------------------------------------  Cardiac Enzymes  Recent Labs Lab 06/05/16 1728  TROPONINI 0.03*   ------------------------------------------------------------------------------------------------------------------  RADIOLOGY:  Mr Brain Wo Contrast  Result Date: 06/10/2016 CLINICAL DATA:  Three day history of altered mental status.  Fever. Diabetes. EXAM: MRI HEAD WITHOUT CONTRAST TECHNIQUE: Multiplanar, multiecho pulse sequences of the brain and surrounding structures were obtained without intravenous contrast. COMPARISON:  CT head 06/09/2016. FINDINGS: Brain: There is a tiny focus of restricted diffusion in the LEFT insula consistent with acute infarction. This measures only 2 or 3 mm in size. No other definite areas of restriction are seen. Global atrophy.  Hydrocephalus ex vacuo. Widespread areas of chronic cerebral ischemia and chronic intracerebral hemorrhage, all likely sequelae of hypertensive cerebrovascular disease. Extensive T2 and FLAIR hyperintensities throughout the white matter representing chronic microvascular ischemic change. Vascular: The flow voids are maintained in the carotid, basilar, and both vertebral arteries. There are widespread areas of chronic hemorrhage throughout the brain, involving both cerebellar hemispheres in the deep white matter, LEFT external capsule and centrum semiovale as well as the RIGHT midbrain. These likely represent sequelae of bleeds secondary to hypertensive cerebral vascular disease. Skull and upper cervical spine: Pituitary and cerebellar tonsils are unremarkable. There is marked pannus contributing to narrowing at the craniocervical junction. Slight cord flattening at the C1 level probably not significant. Sinuses/Orbits: Layering LEFT maxillary sinus fluid suggesting acuity. Layering sphenoid sinus fluid as well. BILATERAL cataract extraction. Other: None IMPRESSION: Tiny focus of restricted diffusion LEFT insula consistent with acute infarction. This measures only 2-3 mm in size. Global atrophy, extensive small vessel disease, and numerous areas of chronic intracranial hemorrhage, likely sequelae of hypertensive cerebrovascular disease. Layering sphenoid and maxillary sinus fluid, suggesting acuity. Marked pannus surrounds the odontoid. Relative narrowing at the craniocervical junction.  Electronically Signed   By: Elsie StainJohn T Curnes M.D.   On: 06/10/2016 16:02   [place] Carotid Bilateral  Result Date: 06/11/2016 CLINICAL DATA:  Slurred speech, altered mental status, acute stroke symptoms EXAM: BILATERAL CAROTID DUPLEX ULTRASOUND TECHNIQUE: Wallace CullensGray scale imaging, color Doppler and duplex ultrasound were performed of bilateral carotid and vertebral arteries in the neck. COMPARISON:  06/10/2016 brain MRI FINDINGS: Criteria: Quantification of carotid stenosis is based on velocity parameters that correlate the residual internal carotid diameter with NASCET-based stenosis levels, using the diameter of the distal internal carotid lumen as the denominator for stenosis measurement. The following velocity measurements were obtained: RIGHT ICA:  131/22 cm/sec CCA:  73/13 cm/sec SYSTOLIC ICA/CCA RATIO:  1.79 DIASTOLIC ICA/CCA RATIO:  1.71 ECA:  417 cm/sec LEFT ICA:  204/33 cm/sec CCA:  116/24 cm/sec SYSTOLIC ICA/CCA RATIO:  1.76 DIASTOLIC ICA/CCA RATIO:  1.36 ECA:  135 cm/sec RIGHT CAROTID ARTERY: Moderately severe partially calcified right carotid bifurcation atherosclerosis. This significantly narrows the lumen of the right ECA with an elevated ECA origin velocity as above. Despite this, the right ICA remains patent with minor plaque formation. No hemodynamically significant right ICA stenosis, velocity elevation, or turbulent flow. Degree of right ICA narrowing is less than 50%. RIGHT VERTEBRAL ARTERY:  Antegrade LEFT CAROTID ARTERY: Similar moderate heterogeneous and partially calcified left carotid bifurcation atherosclerosis. Tortuous left ICA evident. No significant luminal narrowing by grayscale imaging. Tortuosity appears to result in an elevated velocity left ICA velocity measuring 204/32 cm/sec. Degree of stenosis still estimated less than 50%. LEFT VERTEBRAL ARTERY:  Antegrade IMPRESSION: Moderate bilateral carotid atherosclerosis and tortuosity, worse on the left. Tortuosity appears result in left ICA  velocity elevation. Degree of ICA stenosis estimated less than  50% bilaterally. Patent antegrade vertebral flow bilaterally Electronically Signed   By: Judie Petit.  Shick M.D.   On: 06/11/2016 11:42    EKG:   Orders placed or performed during the hospital encounter of 06/08/16  . EKG 12-Lead  . EKG 12-Lead  . ED EKG 12-Lead  . ED EKG 12-Lead  . EKG 12-Lead  . EKG 12-Lead    ASSESSMENT AND PLAN:   82 year old female with past medical history significant for diabetes mellitus, GERD, depression, hypertension, history of stroke presents to hospital from assisted living facility secondary to altered mental status.  #1 altered mental status-metabolic encephalopathy secondary to sepsis and also acute CVA - MRI brain with acute infract in left insular area, but has h/o chronic intracranial hemorrhages from previous strokes, likely from hypertension.. - swallow eval done- on puree diet and thin liquids with precautions,PT, OT consults - neuro consulted - carotid dopplers with atherosclerosis noted, with no hemodynamically significant stenosis. Echo is pending. -Patient is started on statin. But due to evidence of chronic intracranial hemorrhages, not started on aspirin yet. Would appreciate neurology input. -No history of A. fib, but had atrial flutter when she got admitted. Still likely has underlying paroxysmal A. fib and might need anticoagulation if cleared by neurology -Will need rehabilitation at discharge  #2 septic shock-secondary to acute cystitis. -Urine cultures growing sensitive Escherichia coli. on IV Rocephin- changed to Keflex today is able to take oral medications. -Blood cultures are negative. Repeat urine analysis today  #3 Diarrhea- Improving. - c.diff toxin negative, but pcr positive. on flagyl. Contact isolation Probiotics.   #4 acute renal failure-likely ATN from sepsis -Improving with fluids. Continue to monitor.  #4 diabetes mellitus-sugars were in 800s when she came in  and was on insulin drip. Improved with fluids -On Lantus and sliding scale at this time.  #5 hypertension- on oral norvasc and monitor  #6 DVT prophylaxis-on Lovenox  Physical therapy consulted Discharge to rehab in 1-2 days    All the records are reviewed and case discussed with Care Management/Social Workerr. Management plans discussed with the patient, family and they are in agreement.  CODE STATUS: DO NOT RESUSCITATE  TOTAL TIME TAKING CARE OF THIS PATIENT: 37 minutes.   POSSIBLE D/C IN 1-2 DAYS, DEPENDING ON CLINICAL CONDITION.   Enid Baas M.D on 06/12/2016 at 12:38 PM  Between 7am to 6pm - Pager - [phone]  After 6pm go to [email or web address] - Social research officer, government  [facility]  Office  [phone]  CC: Primary care physician; Mariam Dollar, MD

## 2016-06-13 LAB — GLUCOSE, CAPILLARY
GLUCOSE-CAPILLARY: 107 mg/dL — AB (ref 65–99)
GLUCOSE-CAPILLARY: 89 mg/dL (ref 65–99)
GLUCOSE-CAPILLARY: 94 mg/dL (ref 65–99)
GLUCOSE-CAPILLARY: 94 mg/dL (ref 65–99)
Glucose-Capillary: 143 mg/dL — ABNORMAL HIGH (ref 65–99)
Glucose-Capillary: 55 mg/dL — ABNORMAL LOW (ref 65–99)
Glucose-Capillary: 57 mg/dL — ABNORMAL LOW (ref 65–99)
Glucose-Capillary: 122 mg/dL — ABNORMAL HIGH (ref 65–99)
Glucose-Capillary: 60 mg/dL — ABNORMAL LOW (ref 65–99)
Glucose-Capillary: 93 mg/dL (ref 65–99)

## 2016-06-13 LAB — CULTURE, BLOOD (ROUTINE X 2)
CULTURE: NO GROWTH
Culture: NO GROWTH

## 2016-06-13 MED ORDER — CLOPIDOGREL BISULFATE 75 MG PO TABS
75.0000 mg | ORAL_TABLET | Freq: Every day | ORAL | Status: DC
  Administered 2016-06-13 – 2016-06-15 (×3): 75 mg via ORAL
  Filled 2016-06-13 (×3): qty 1

## 2016-06-13 MED ORDER — QUETIAPINE FUMARATE ER 50 MG PO TB24
150.0000 mg | ORAL_TABLET | Freq: Every day | ORAL | Status: DC
  Administered 2016-06-13 – 2016-06-14 (×2): 150 mg via ORAL
  Filled 2016-06-13 (×3): qty 3

## 2016-06-13 MED ORDER — FAMOTIDINE 20 MG PO TABS
20.0000 mg | ORAL_TABLET | ORAL | Status: DC
  Administered 2016-06-14: 09:00:00 20 mg via ORAL
  Filled 2016-06-13: qty 1

## 2016-06-13 NOTE — Progress Notes (Signed)
[employer] - [place] at [facility]   PATIENT NAME: Julie DikeMary Burch    MR#:  [id number]  DATE OF BIRTH:  1926/08/15  SUBJECTIVE:  CHIEF COMPLAINT:   Chief Complaint  Patient presents with  . Code Sepsis   - Patient more alert and requesting to go home today. Daughter at bedside and complains of agitation and delirium at times. -Her dementia meds have been adjusted -Anticipate discharge to rehabilitation tomorrow  REVIEW OF SYSTEMS:  Review of Systems  Constitutional: Positive for malaise/fatigue. Negative for chills and fever.  HENT: Positive for hearing loss. Negative for congestion, ear discharge and nosebleeds.   Eyes: Negative for blurred vision and double vision.  Respiratory: Negative for cough, shortness of breath and wheezing.   Cardiovascular: Negative for chest pain, palpitations and leg swelling.  Gastrointestinal: Negative for abdominal pain, constipation, diarrhea, nausea and vomiting.  Genitourinary: Negative for dysuria.  Musculoskeletal: Negative for myalgias.  Neurological: Positive for speech change and focal weakness. Negative for dizziness, sensory change, seizures and headaches.  Psychiatric/Behavioral:       Confusion    DRUG ALLERGIES:   Allergies  Allergen Reactions  . Aspirin Other (See Comments)    Reaction:  Intracranial hemorrhage   . Ciprofloxacin Other (See Comments)    Reaction:  Hallucinations/severe agitation    . Diovan [Valsartan] Other (See Comments)    Reaction:  High potassium levels   . Fosamax [Alendronate Sodium] Other (See Comments)    Reaction:  GI upset   . Sulfa Antibiotics Hives    VITALS:  Blood pressure (!) 160/48, pulse 70, temperature 98.3 F (36.8 C), temperature source Oral, resp. rate 20, height 5\' 4"  (1.626 m), weight 58 kg (127 lb 12.8 oz), SpO2 98 %.  PHYSICAL EXAMINATION:  Physical Exam  GENERAL:  82 y.o.-year-old patient lying in the bed with no acute distress. Hard of hearing EYES:  Pupils equal, round, reactive to light and accommodation. No scleral icterus. Extraocular muscles intact.  HEENT: Head atraumatic, normocephalic. Oropharynx and nasopharynx clear.  NECK:  Supple, no jugular venous distention. No thyroid enlargement, no tenderness.  LUNGS: Normal breath sounds bilaterally, no wheezing, rales,rhonchi or crepitation. No use of accessory muscles of respiration. Decreased bibasilar breath sounds. CARDIOVASCULAR: S1, S2 normal. No  rubs, or gallops. 3/6 systolic murmur is present. ABDOMEN: Soft, nontender, nondistended. Bowel sounds present. No organomegaly or mass.  EXTREMITIES: No pedal edema, cyanosis, or clubbing.  NEUROLOGIC: slurred speech, no other cranial nerve deficits, communicating better Moving all extremities in bed- slightly weaker on right side when worked with PT yesterday, following simple commands PSYCHIATRIC: The patient is alert, oriented x 1-2, following simple commands, hard of hearing though SKIN: No obvious rash, lesion, or ulcer.    LABORATORY PANEL:   CBC  Recent Labs Lab 06/11/16 0506  WBC 7.3  HGB 10.0*  HCT 29.3*  PLT 135*   ------------------------------------------------------------------------------------------------------------------  Chemistries   Recent Labs Lab 06/08/16 0638  06/11/16 0506  NA 128*  < > 139  K 6.5*  < > 3.5  CL 93*  < > 108  CO2 10*  < > 26  GLUCOSE 856*  < > 178*  BUN 46*  < > 29*  CREATININE 2.61*  < > 1.19*  CALCIUM 8.5*  < > 8.1*  MG  --   < > 1.7  AST 52*  --   --   ALT 19  --   --   ALKPHOS 65  --   --  BILITOT 1.4*  --   --   < > = values in this interval not displayed. ------------------------------------------------------------------------------------------------------------------  Cardiac Enzymes No results for input(s): TROPONINI in the last 168  hours. ------------------------------------------------------------------------------------------------------------------  RADIOLOGY:  No results found.  EKG:   Orders placed or performed during the hospital encounter of 06/08/16  . EKG 12-Lead  . EKG 12-Lead  . ED EKG 12-Lead  . ED EKG 12-Lead  . EKG 12-Lead  . EKG 12-Lead    ASSESSMENT AND PLAN:   82 year old female with past medical history significant for diabetes mellitus, GERD, depression, hypertension, history of stroke presents to hospital from assisted living facility secondary to altered mental status.  #1 altered mental status-metabolic encephalopathy secondary to sepsis and also acute CVA - MRI brain with acute infract in left insular area, but has h/o chronic intracranial hemorrhages from previous strokes, likely from hypertension.. - swallow eval done- on puree diet and thin liquids with precautions,PT, OT consults - neuro consulted to help with anticoagulation as patient had prior hemorrhagic strokes. -Started on oral Plavix along with statin. - carotid dopplers with atherosclerosis noted, with no hemodynamically significant stenosis. Echo is normal with EF of 65%. -Patient is started on statin. But due to evidence of chronic intracranial hemorrhages, not started on aspirin yet. Would appreciate neurology input. -No history of A. fib, but had atrial flutter when she got admitted. No stronger anticoagulation due to history of hemorrhagic strokes.-Will need rehabilitation at discharge  #2 septic shock-secondary to acute cystitis. -Urine cultures growing sensitive Escherichia coli. on Keflex now. Can be stopped after 7 days. -Blood cultures are negative.  #3 Diarrhea- Improving. - c.diff toxin negative, but pcr positive. on flagyl. Contact isolation Probiotics. Continue for 7 days after stopping keflex  #4 acute renal failure-likely ATN from sepsis -Improving with fluids. Continue to monitor.  #4 diabetes  mellitus-sugars were in 800s when she came in and was on insulin drip. Improved with fluids -On sliding scale at this time. -Lantus discontinued as patient was hypoglycemic this morning and also has poor oral intake. A1c is 8.9  #5 hypertension- on oral norvasc and monitor  #6 DVT prophylaxis-on Lovenox  Physical therapy consulted Discharge to rehab tomorrow    All the records are reviewed and case discussed with Care Management/Social Workerr. Management plans discussed with the patient, family and they are in agreement.  CODE STATUS: DO NOT RESUSCITATE  TOTAL TIME TAKING CARE OF THIS PATIENT: 37 minutes.   POSSIBLE D/C tomorrow, DEPENDING ON CLINICAL CONDITION.   Enid BaasKALISETTI,Maraya Gwilliam M.D on 06/13/2016 at 2:22 PM  Between 7am to 6pm - Pager - [phone]  After 6pm go to [email or web address] - Social research officer, governmentpassword EPAS [facility]  [facility] [place] [facility]  Office  [phone]  CC: Primary care physician; Mariam DollarPHELPS, ANNE F, MD

## 2016-06-13 NOTE — Plan of Care (Signed)
Problem: SLP Dysphagia Goals Goal: Misc Dysphagia Goal Pt will safely tolerate po diet of least restrictive consistency w/ no overt s/s of aspiration noted by Staff/pt/family x3 sessions.    

## 2016-06-13 NOTE — Progress Notes (Signed)
Patient agitated at times this shift, daughter at bedside. Patient became hypoglycemic this am, blood sugar 54, 1/2 amp of D50 given per PRN order with improvement.

## 2016-06-13 NOTE — Progress Notes (Signed)
OT Cancellation Note  Patient Details Name: Julie DikeMary Burch MRN: [id number] DOB: July 03, 1927   Cancelled Treatment:    Reason Eval/Treat Not Completed: Fatigue/lethargy limiting ability to participate.  Pt did not fall asleep until 3:30am with confusion and agitation and was asleep and not arousing for second attempt at OT evaluation.  Daughter requesting to talk to SW and was updated from conversation with SW that they are waiting for Dr Nemiah CommanderKalisetti before talking to her about discharge plans. Anticipate that patient will be total assist for all ADLs based on current status.   Susanne BordersSusan Wofford, OTR/L ascom [phone] 06/13/16, 10:35 AM

## 2016-06-13 NOTE — Progress Notes (Signed)
Chaplain was making his rounds and visited with pt in room 106. Provided the ministry of prayer and emotional support.

## 2016-06-13 NOTE — Progress Notes (Signed)
Speech Language Pathology Treatment: Dysphagia  Patient Details Name: Lynnell DikeMary Fischetti MRN: [id number] DOB: 1927/02/16 Today's Date: 06/13/2016 Time: 1000-1030 SLP Time Calculation (min) (ACUTE ONLY): 30 min  Assessment / Plan / Recommendation Clinical Impression  Pt continues to appear at min increased risk for aspiration moreso w/ thin liquids d/t drowsiness and overall weakness/illness. However, this may be a baseline presentation of pt's as per Daughter's description. Daughter endorsed episodes of pt "coughing at home during meals and when drinking liquids" for some time but denied any Pulmonary deficits from such. But d/t pt's new change in status secondary to small Left infarct, hospitalization(more sedentary), and baseline Dementia/Cognitive decline(Global atrophy, extensive small vessel disease, and numerous areas of chronic intracranial hemorrhage), pt is at increased risk for dysphagia w/ aspiration and sequelae of such including Pneumonia. Oral phase of bolus management continues to be c/b slower bolus manipulation w/ increased texture; noted slower lingual movements during tasks and speech as well, but pt was able to clear orally given time. Pt does benefit from a modified dsyphagia diet of Dysphagia 1 w/ thin liquids w/ strict aspiration precautions to monitor need for change of liquid consistency to Nectar consistency. Daughter present stating pt is drinking a few sips of liquids "like she does at home" w/ no increased frequency or severity of the coughing noted - per Daughter. Education discussed again on aspiration precautions and diet recommendations; food prep/options for pt as she is not taking but bites and sips total. Dietician is following. ST will continue to f/u for toleration of diet; thin liquids as well as ongoing education w/ precautions and strategies. Daughter agreed.     HPI  Pt is an 82 y/o female who has a past medical history of Cognitive decline per report; GERD; Depression;  Diabetes mellitus without complication (HCC); GERD (gastroesophageal reflux disease); Hypertension; Recurrent UTI; and Stroke (HCC). She was recently treated 2 days ago for UTI; now admitted.       SLP Plan  Continue with current plan of care     Recommendations  Diet recommendations: Dysphagia 1 (puree);Thin liquid Liquids provided via: Cup Medication Administration: Whole meds with puree (crush only if necessary) Supervision: Full supervision/cueing for compensatory strategies;Trained caregiver to feed patient (family assist) Compensations: Minimize environmental distractions;Slow rate;Small sips/bites;Lingual sweep for clearance of pocketing;Multiple dry swallows after each bite/sip;Follow solids with liquid;Clear throat intermittently (hard throat clear/cough post meals) Postural Changes and/or Swallow Maneuvers: Seated upright 90 degrees;Upright 30-60 min after meal                General recommendations:  (Dietician f/u) Oral Care Recommendations: Oral care BID;Staff/trained caregiver to provide oral care Follow up Recommendations: Skilled Nursing facility (TBD) Plan: Continue with current plan of care       GO               Jerilynn SomKatherine Sacoya Mcgourty, MS, CCC-SLP Amaia Lavallie 06/13/2016, 1:11 PM

## 2016-06-13 NOTE — Progress Notes (Signed)
OT Cancellation Note  Patient Details Name: Julie Burch MRN: [id number] DOB: [birth year]   Cancelled Treatment:    Reason Eval/Treat Not Completed: Fatigue/lethargy limiting ability to participate. Pt continues to be confused and intermittently agitated but more calm with family visiting with her.  Family requested evaluation be held until patient is more calm.  Will continue to attempt OT evaluation as tolerated.  Possible DC tomorrow to SNF per conversation with Minerva AreolaEric from SW.  Susanne BordersSusan Wofford, OTR/L ascom [phone] 06/13/16, 2:59 PM

## 2016-06-13 NOTE — [place] FL2 (Signed)
[place] MEDICAID FL2 LEVEL OF CARE SCREENING TOOL     IDENTIFICATION  Patient Name: Julie Burch Birthdate: 1926-11-09 Sex: female Admission Date (Current Location): 06/08/2016  [place]   Facility and Address:  [facility], [address], [place], [place] [address]      Provider Number: [id number]  Attending Physician Name and Address:  Enid Baasadhika Kalisetti, MD  Relative Name and Phone Number:  Roque Casholer,Sandra Daughter [phone] or Toler,[phone]    Current Level of Care: Hospital Recommended Level of Care: Skilled Nursing Facility Prior Approval Number:    Date Approved/Denied:   PASRR Number: [id number]  Discharge Plan: SNF    Current Diagnoses: Patient Active Problem List   Diagnosis Date Noted  . Septic shock (HCC) 06/08/2016  . Recurrent UTI 02/09/2016  . GERD (gastroesophageal reflux disease) 02/09/2016  . HTN (hypertension) 02/09/2016  . Hypothyroidism 02/09/2016  . Depression 02/09/2016  . Acute on chronic kidney failure (HCC) 02/09/2016  . Diabetes (HCC) 02/09/2016    Orientation RESPIRATION BLADDER Height & Weight     Self, Place  Normal Incontinent Weight: 127 lb 12.8 oz (58 kg) (pt had three blankets on the bed) Height:  5\' 4"  (162.6 cm)  BEHAVIORAL SYMPTOMS/MOOD NEUROLOGICAL BOWEL NUTRITION STATUS      Incontinent Diet (Dys 1)  AMBULATORY STATUS COMMUNICATION OF NEEDS Skin   Limited Assist Verbally Normal                       Personal Care Assistance Level of Assistance  Bathing, Dressing, Feeding Bathing Assistance: Limited assistance Feeding assistance: Maximum assistance Dressing Assistance: Limited assistance     Functional Limitations Info  Sight, Hearing, Speech Sight Info: Adequate Hearing Info: Impaired Speech Info: Impaired    SPECIAL CARE FACTORS FREQUENCY  PT (By licensed PT)     PT Frequency: 7x a week               Contractures Contractures Info: Not present    Additional Factors Info  Code Status, Allergies, Psychotropic, Insulin Sliding Scale (DNR) Code Status Info: DNR Allergies Info: ASPIRIN, CIPROFLOXACIN, DIOVAN VALSARTAN, FOSAMAX ALENDRONATE SODIUM, SULFA ANTIBIOTICS Psychotropic Info: QUEtiapine (SEROQUEL XR) 24 hr tablet 150 mg and sertraline (ZOLOFT) tablet 100 mg Insulin Sliding Scale Info: insulin aspart (novoLOG) injection 0-5 Units daily at night, or insulin aspart (novoLOG) injection 0-9 Units 3x a day with meals       Current Medications (06/13/2016):  This is the current hospital active medication list Current Facility-Administered Medications  Medication Dose Route Frequency Provider Last Rate Last Dose  . acetaminophen (TYLENOL) suppository 650 mg  650 mg Rectal Q6H PRN Gwendolyn FillBincy S Varughese, NP   650 mg at 06/10/16 1338  . acetaminophen (TYLENOL) tablet 650 mg  650 mg Oral Q4H PRN Erin FullingKurian Kasa, MD   650 mg at 06/12/16 1441  . acidophilus (RISAQUAD) capsule 1 capsule  1 capsule Oral BID Enid Baasadhika Kalisetti, MD   1 capsule at 06/13/16 0854  . amLODipine (NORVASC) tablet 5 mg  5 mg Oral Daily Enid Baasadhika Kalisetti, MD   5 mg at 06/13/16 0854  . atorvastatin (LIPITOR) tablet 40 mg  40 mg Oral q1800 Enid Baasadhika Kalisetti, MD   40 mg at 06/12/16 1618  . cephALEXin (KEFLEX) capsule 250 mg  250 mg Oral Q8H Enid Baasadhika Kalisetti, MD   250 mg at 06/13/16 1506  . clopidogrel (PLAVIX) tablet 75 mg  75 mg Oral Daily Enid Baasadhika Kalisetti, MD  75 mg at 06/13/16 1506  . dextrose 50 % solution 25 mL  25 mL Intravenous PRN Gwendolyn FillBincy S Varughese, NP   25 mL at 06/13/16 0611  . donepezil (ARICEPT) tablet 5 mg  5 mg Oral QHS Enid Baasadhika Kalisetti, MD   5 mg at 06/12/16 2120  . enoxaparin (LOVENOX) injection 30 mg  30 mg Subcutaneous Q24H Enid Baasadhika Kalisetti, MD   30 mg at 06/13/16 0855  . [START ON 06/14/2016] famotidine (PEPCID) tablet 20 mg  20 mg Oral Q48H Erin FullingKurian Kasa, MD      . hydrALAZINE (APRESOLINE) injection  20 mg  20 mg Intravenous Q4H PRN Eugenie Norrieana G Blakeney, NP   20 mg at 06/13/16 0004  . hydrALAZINE (APRESOLINE) tablet 25 mg  25 mg Oral Q8H Oralia Manisavid Willis, MD   25 mg at 06/13/16 1506  . insulin aspart (novoLOG) injection 0-5 Units  0-5 Units Subcutaneous QHS Enid Baasadhika Kalisetti, MD      . insulin aspart (novoLOG) injection 0-9 Units  0-9 Units Subcutaneous TID WC Enid Baasadhika Kalisetti, MD   1 Units at 06/13/16 0854  . labetalol (NORMODYNE,TRANDATE) injection 10-20 mg  10-20 mg Intravenous Q2H PRN Bincy S Varughese, NP   20 mg at 06/10/16 1126  . levothyroxine (SYNTHROID, LEVOTHROID) tablet 50 mcg  50 mcg Oral QAC breakfast Enid Baasadhika Kalisetti, MD   50 mcg at 06/13/16 0855  . MEDLINE mouth rinse  15 mL Mouth Rinse BID Erin FullingKurian Kasa, MD   15 mL at 06/13/16 1507  . metroNIDAZOLE (FLAGYL) tablet 500 mg  500 mg Oral Q8H Enid Baasadhika Kalisetti, MD   500 mg at 06/13/16 1506  . ondansetron (ZOFRAN) injection 4 mg  4 mg Intravenous Q6H PRN Erin FullingKurian Kasa, MD      . QUEtiapine (SEROQUEL XR) 24 hr tablet 150 mg  150 mg Oral QHS Enid Baasadhika Kalisetti, MD      . sertraline (ZOLOFT) tablet 100 mg  100 mg Oral Daily Enid Baasadhika Kalisetti, MD   100 mg at 06/13/16 [id number]     Discharge Medications: Please see discharge summary for a list of discharge medications.  Relevant Imaging Results:  Relevant Lab Results:   Additional Information SSN [id number]  Darleene Cleavernterhaus, Eric R

## 2016-06-13 NOTE — Progress Notes (Signed)
PT Cancellation Note  Patient Details Name: Julie Burch MRN: [id number] DOB: [birth year]   Cancelled Treatment:    Reason Eval/Treat Not Completed: Fatigue/lethargy limiting ability to participate. Treatment attempted again; pt continues sleeping and family wishes pt to rest. Re attempt tomorrow.   Scot DockHeidi E Ragena Fiola, PTA 06/13/2016, 2:37 PM

## 2016-06-13 NOTE — Progress Notes (Signed)
PHARMACIST - PHYSICIAN COMMUNICATION  CONCERNING: IV to Oral Route Change Policy  RECOMMENDATION: This patient is receiving Famotidine by the intravenous route.  Based on criteria approved by the Pharmacy and Therapeutics Committee, the intravenous medication(s) is/are being converted to the equivalent oral dose form(s).   DESCRIPTION: These criteria include:  The patient is eating (either orally or via tube) and/or has been taking other orally administered medications for a least 24 hours  The patient has no evidence of active gastrointestinal bleeding or impaired GI absorption (gastrectomy, short bowel, patient on TNA or NPO).  If you have questions about this conversion, please contact the Pharmacy Department  []   [phone]( [phone] )  [facility] [x]   [phone]( [phone] )  [facility] []   [phone]( [phone] )  [facility] []   [phone]( [phone] )  [facility] []   [phone]( [phone] )  [facility]   HerefordMerrill,Glendene Wyer A, [facility] InstituteRPH 06/13/2016 12:15 PM

## 2016-06-13 NOTE — Progress Notes (Signed)
PT Cancellation Note  Patient Details Name: Julie DikeMary Burch MRN: [id number] DOB: 12-18-1926   Cancelled Treatment:    Reason Eval/Treat Not Completed: Fatigue/lethargy limiting ability to participate;Other (comment). Treatment attempted; pt soundly sleeping with family member in the room. Family notes pt had a "terrible night" and is finally resting; family wishes to allow pt to sleep and requests to re attempt treatment late today if possible. Therapist agreeable. Will re attempt treatment at a later time/date as the schedule allows.    Scot DockHeidi E Eric Nees, PTA 06/13/2016, 11:07 AM

## 2016-06-14 LAB — GLUCOSE, CAPILLARY
GLUCOSE-CAPILLARY: 167 mg/dL — AB (ref 65–99)
GLUCOSE-CAPILLARY: 154 mg/dL — AB (ref 65–99)
GLUCOSE-CAPILLARY: 248 mg/dL — AB (ref 65–99)
Glucose-Capillary: 127 mg/dL — ABNORMAL HIGH (ref 65–99)
Glucose-Capillary: 175 mg/dL — ABNORMAL HIGH (ref 65–99)
Glucose-Capillary: 224 mg/dL — ABNORMAL HIGH (ref 65–99)
Glucose-Capillary: 284 mg/dL — ABNORMAL HIGH (ref 65–99)

## 2016-06-14 LAB — CREATININE, SERUM
Creatinine, Ser: 0.91 mg/dL (ref 0.44–1.00)
GFR calc Af Amer: >60 mL/min (ref >60)
GFR calc non Af Amer: 56 mL/min — ABNORMAL LOW (ref >60)

## 2016-06-14 MED ORDER — FAMOTIDINE 20 MG PO TABS: 20.0000 mg | ORAL_TABLET | ORAL | 0 refills | Status: DC

## 2016-06-14 MED ORDER — ORAL CARE MOUTH RINSE: 15.0000 mL | Freq: Two times a day (BID) | OROMUCOSAL | 0 refills | Status: DC

## 2016-06-14 MED ORDER — AMLODIPINE BESYLATE 10 MG PO TABS
10.0000 mg | ORAL_TABLET | Freq: Every day | ORAL | Status: DC
  Filled 2016-06-14: qty 1

## 2016-06-14 MED ORDER — AMLODIPINE BESYLATE 10 MG PO TABS: 10.0000 mg | ORAL_TABLET | Freq: Every day | ORAL | 0 refills | Status: DC

## 2016-06-14 MED ORDER — INSULIN LISPRO 100 UNIT/ML ~~LOC~~ SOLN: 3.0000 [IU] | Freq: Three times a day (TID) | SUBCUTANEOUS | 11 refills | Status: DC

## 2016-06-14 MED ORDER — AMLODIPINE BESYLATE 5 MG PO TABS
5.0000 mg | ORAL_TABLET | Freq: Once | ORAL | Status: AC
  Administered 2016-06-14: 5 mg via ORAL
  Filled 2016-06-14: qty 1

## 2016-06-14 MED ORDER — ATORVASTATIN CALCIUM 40 MG PO TABS: 40.0000 mg | ORAL_TABLET | Freq: Every day | ORAL | 0 refills | Status: DC

## 2016-06-14 MED ORDER — ENOXAPARIN SODIUM 40 MG/0.4ML ~~LOC~~ SOLN
40.0000 mg | SUBCUTANEOUS | Status: DC
  Administered 2016-06-15: 10:00:00 40 mg via SUBCUTANEOUS
  Filled 2016-06-14: qty 0.4

## 2016-06-14 MED ORDER — RISAQUAD PO CAPS: 1.0000 | ORAL_CAPSULE | Freq: Two times a day (BID) | ORAL | 0 refills | Status: DC

## 2016-06-14 MED ORDER — CEPHALEXIN 500 MG PO CAPS: 500.0000 mg | ORAL_CAPSULE | Freq: Three times a day (TID) | ORAL | 0 refills | Status: DC

## 2016-06-14 MED ORDER — CLOPIDOGREL BISULFATE 75 MG PO TABS: 75.0000 mg | ORAL_TABLET | Freq: Every day | ORAL | 0 refills | Status: DC

## 2016-06-14 MED ORDER — METRONIDAZOLE 500 MG PO TABS: 500.0000 mg | ORAL_TABLET | Freq: Three times a day (TID) | ORAL | 0 refills | Status: DC

## 2016-06-14 NOTE — Care Management Important Message (Signed)
Important Message  Patient Details  Name: Julie DikeMary Lusk MRN: [id number] Date of Birth: 11/24/1926   Medicare Important Message Given:  Yes    Gwenette GreetBrenda S Tyqwan Pink, RN 06/14/2016, 8:26 AM

## 2016-06-14 NOTE — Clinical Social Work Note (Addendum)
MSW contacted patient's family to update them on bed offers.  MSW spoke to patient's son in law, and he said to contact patient's daughter.  MSW contacted patient's daughter and left a message with other bed offers.  Patient's family is going to tour SNFs today and then contact MSW with decision.  MSW continuing to follow patient's progress throughout discharge planning.  4:20pm  MSW spoke to patient's family regarding SNF offers, Patient's family chose [facility] of [address].  MSW contacted [facility] and they can accept patient tomorrow morning due to SNF not being able to order all of meds tonight, MSW updated MD who cancelled discharge for today.  MSW to continue to follow patient's progress and facilitate discharge to [facility] tomorrow.  Ervin KnackEric R. Almadelia Looman, MSW [phone]  Mon-Fri 8a-4:30p 06/14/2016 10:07 AM

## 2016-06-14 NOTE — Progress Notes (Signed)
Pharmacy Note - Anticoagulation  Patient with orders for enoxaparin 30mg  SQ Q24H for VTE prophylaxis.  Estimated Creatinine Clearance: 38.3 mL/min (by C-G formula based on SCr of 0.91 mg/dL).   Will adjust to enoxaparin 40mg  SQ Q24H for CrCl >6130ml/min  Garlon HatchetJody Briony Parveen, PharmD Clinical Pharmacist  06/14/2016 10:18 AM

## 2016-06-14 NOTE — Discharge Instructions (Signed)
Activity per PT recommendations  follow-up with primary care physician in 3-4 days Follow-up with neurology in 2 weeks

## 2016-06-14 NOTE — Discharge Summary (Signed)
[facility]  at [facility]   PATIENT NAME: Julie Burch    MR#:  [id number]  DATE OF BIRTH:  January 24, 1927  DATE OF ADMISSION:  06/08/2016 ADMITTING PHYSICIAN: Erin FullingKurian Kasa, MD  DATE OF DISCHARGE: 06/15/16 PRIMARY CARE PHYSICIAN: Mariam DollarPHELPS, ANNE F, MD    ADMISSION DIAGNOSIS:  Sepsis, due to unspecified organism (HCC) [A41.9] Diabetic ketoacidosis without coma associated with type 2 diabetes mellitus (HCC) [E13.10]  DISCHARGE DIAGNOSIS:  Active Problems:   Septic shock (HCC)  Acute CVA   SECONDARY DIAGNOSIS:   Past Medical History:  Diagnosis Date  . Depression   . Diabetes mellitus without complication (HCC)   . GERD (gastroesophageal reflux disease)   . Hypertension   . Recurrent UTI   . Stroke [facility](HCC)    TIA    HOSPITAL COURSE:     82 year old female with past medical history significant for diabetes mellitus, GERD, depression, hypertension, history of stroke presents to hospital from assisted living facility secondary to altered mental statusFrom septic shock. Please review history and physical, progress note and consult note for details  #1 altered mental status-metabolic encephalopathy secondary to sepsis and also acute CVA - MRI brain with acute infract in left insular area, but has h/o chronic intracranial hemorrhages from previous strokes, likely from hypertension.. - swallow eval done- on puree diet and thin liquids with precautions -PT OT has recommended skilled nursing facility - neuro consulted to help with anticoagulation as patient had prior hemorrhagic strokes. -Started on oral Plavix along with statin as per neurology recommendations - carotid dopplers with atherosclerosis noted, with no hemodynamically significant stenosis. Echo is normal with EF of 65%. -No history of A. fib, but had atrial flutter when she got admitted. No stronger anticoagulation due to history of hemorrhagic strokes.-Will need rehabilitation at  discharge  #2 septic shock-secondary to acute cystitis. -Urine cultures growing sensitive Escherichia coli 11/27 urine culture and no growth on 11/30 urine cultures. on Keflex now. Can be stopped after 7 days. Primary care physician to consider prophylactic antibiotic for recurrent UTIs after finishing the current antibiotics -Blood cultures are negative.  #3 Diarrhea- Improving. - c.diff toxin negative, but pcr positive. on flagyl. Contact isolation Probiotics. Continue for 7 days after stopping keflex  #4 acute renal failure-likely ATN from sepsis -Improving with fluids. Continue to monitor.  #4 diabetes mellitus-sugars were in 800s when she came in and was on insulin drip. Improved with fluids -On sliding scale at this time. -Lantus dose decreased to 9 units from 17 units which was her home medication as patient was hypoglycemic  and also has poor oral intake. A1c is 8.9. primary care physician can adjust the dose  -iss -Neurontin 100 mg 3 times a day is added to the regimen for diabetic neuropathy  #5 hypertension- blood pressure elevated increase the dose of Norvasc to 10 mg and titrate as needed   #6 DVT prophylaxis-on Lovenox  Physical therapy consulted Discharge [facility] Daughter and son-in-law are visiting facilities today Discussed with care manager and social worker   DISCHARGE CONDITIONS:   fair  CONSULTS OBTAINED:  Treatment Team:  Thana FarrLeslie Reynolds, MD   PROCEDURES   DRUG ALLERGIES:   Allergies  Allergen Reactions  . Aspirin Other (See Comments)    Reaction:  Intracranial hemorrhage   . Ciprofloxacin Other (See Comments)    Reaction:  Hallucinations/severe agitation    . Diovan [Valsartan] Other (See Comments)    Reaction:  High potassium levels   .  Fosamax [Alendronate Sodium] Other (See Comments)    Reaction:  GI upset   . Sulfa Antibiotics Hives    DISCHARGE MEDICATIONS:   Current Discharge Medication List    START taking  these medications   Details  acidophilus (RISAQUAD) CAPS capsule Take 1 capsule by mouth 2 (two) times daily. Qty: 14 capsule, Refills: 0    atorvastatin (LIPITOR) 40 MG tablet Take 1 tablet (40 mg total) by mouth daily at 6 PM. Qty: 30 tablet, Refills: 0    clopidogrel (PLAVIX) 75 MG tablet Take 1 tablet (75 mg total) by mouth daily. Qty: 30 tablet, Refills: 0    famotidine (PEPCID) 20 MG tablet Take 1 tablet (20 mg total) by mouth every other day. Qty: 30 tablet, Refills: 0    gabapentin (NEURONTIN) 100 MG capsule Take 1 capsule (100 mg total) by mouth 3 (three) times daily. Qty: 90 capsule, Refills: 0    metroNIDAZOLE (FLAGYL) 500 MG tablet Take 1 tablet (500 mg total) by mouth 3 (three) times daily. Qty: 36 tablet, Refills: 0    mouth rinse LIQD solution 15 mLs by Mouth Rinse route 2 (two) times daily. Qty: 354 mL, Refills: 0      CONTINUE these medications which have CHANGED   Details  amLODipine (NORVASC) 10 MG tablet Take 1 tablet (10 mg total) by mouth daily. Qty: 30 tablet, Refills: 0    cephALEXin (KEFLEX) 500 MG capsule Take 1 capsule (500 mg total) by mouth 3 (three) times daily. Qty: 20 capsule, Refills: 0    insulin glargine (LANTUS) 100 UNIT/ML injection Inject 0.09 mLs (9 Units total) into the skin at bedtime. Qty: 10 mL, Refills: 11    insulin lispro (HUMALOG) 100 UNIT/ML injection Inject 0.03-0.05 mLs (3-5 Units total) into the skin 3 (three) times daily with meals. Pt injects 5 units before breakfast at 0900, 5 units before lunch at 1130, and 3 units before dinner at 1700. Qty: 10 mL, Refills: 11      CONTINUE these medications which have NOT CHANGED   Details  acetaminophen (TYLENOL) 500 MG tablet Take 1,000 mg by mouth every 4 (four) hours as needed for mild pain or headache.    cholecalciferol (VITAMIN D) 1000 units tablet Take 2,000 Units by mouth daily.    Coenzyme Q10 (COQ-10) 100 MG CAPS Take 100 mg by mouth daily.    donepezil (ARICEPT) 5 MG  tablet Take 5 mg by mouth at bedtime.    esomeprazole (NEXIUM) 40 MG capsule Take 40 mg by mouth daily before breakfast.    fosfomycin (MONUROL) 3 g PACK Take 3 g by mouth See admin instructions. Pt is to take once every ten days.    glucose 4 GM chewable tablet Chew 4 tablets by mouth as needed (for blood sugar less than 80).    hydrALAZINE (APRESOLINE) 50 MG tablet Take 50 mg by mouth 2 (two) times daily.    levothyroxine (SYNTHROID, LEVOTHROID) 50 MCG tablet Take 50 mcg by mouth daily before breakfast.    ondansetron (ZOFRAN) 4 MG tablet Take 4 mg by mouth every 8 (eight) hours as needed for nausea or vomiting.    QUEtiapine (SEROQUEL) 50 MG tablet Take 150 mg by mouth at bedtime.     sertraline (ZOLOFT) 100 MG tablet Take 150 mg by mouth daily.    vitamin B-12 (CYANOCOBALAMIN) 1000 MCG tablet Take 1,000 mcg by mouth daily.      STOP taking these medications     losartan (COZAAR) 25 MG  tablet       And   DISCHARGE INSTRUCTIONS:   Activity per PT recommendations  follow-up with primary care physician in 3-4 days Follow-up with neurology in 2 weeks   DIET:  Cardiac diet  DISCHARGE CONDITION:  Fair  ACTIVITY:  Activity as tolerated per PT  OXYGEN:  Home Oxygen: No.   Oxygen Delivery: room air  DISCHARGE LOCATION:    Rehab- snf  If you experience worsening of your admission symptoms, develop shortness of breath, life threatening emergency, suicidal or homicidal thoughts you must seek medical attention immediately by calling 911 or calling your MD immediately  if symptoms less severe.  You Must read complete instructions/literature along with all the possible adverse reactions/side effects for all the Medicines you take and that have been prescribed to you. Take any new Medicines after you have completely understood and accpet all the possible adverse reactions/side effects.   Please note  You were cared for by a hospitalist during your hospital stay. If you  have any questions about your discharge medications or the care you received while you were in the hospital after you are discharged, you can call the unit and asked to speak with the hospitalist on call if the hospitalist that took care of you is not available. Once you are discharged, your primary care physician will handle any further medical issues. Please note that NO REFILLS for any discharge medications will be authorized once you are discharged, as it is imperative that you return to your primary care physician (or establish a relationship with a primary care physician if you do not have one) for your aftercare needs so that they can reassess your need for medications and monitor your lab values.     Today  Chief Complaint  Patient presents with  . Code Sepsis   Patient is more awake and alert and answering questions but weak and tired.   ROS:  CONSTITUTIONAL: Denies fevers, chills. Denies any fatigue, reporting weakness, decreased by mouth intake EYES: Denies blurry vision, double vision, eye pain. EARS, NOSE, THROAT: Denies tinnitus, ear pain, hearing loss. RESPIRATORY: Denies cough, wheeze, shortness of breath.  CARDIOVASCULAR: Denies chest pain, palpitations, edema.  GASTROINTESTINAL: Denies nausea, vomiting, diarrhea, abdominal pain. Denies bright red blood per rectum. GENITOURINARY: Denies dysuria, hematuria. ENDOCRINE: Denies nocturia or thyroid problems. HEMATOLOGIC AND LYMPHATIC: Denies easy bruising or bleeding. SKIN: Denies rash or lesion. MUSCULOSKELETAL: Denies pain in neck, back, shoulder, knees, hips or arthritic symptoms.  NEUROLOGIC: Denies paralysis, paresthesias.  PSYCHIATRIC: Denies anxiety or depressive symptoms.   VITAL SIGNS:  Blood pressure (!) 144/43, pulse 82, temperature 98.9 F (37.2 C), temperature source Oral, resp. rate 16, height 5\' 4"  (1.626 m), weight 55.5 kg (122 lb 6.4 oz), SpO2 96 %.  I/O:   No intake or output data in the 24 hours ending  06/15/16 0945  PHYSICAL EXAMINATION:  GENERAL:  82 y.o.-year-old patient lying in the bed with no acute distress.  EYES: Pupils equal, round, reactive to light and accommodation. No scleral icterus.   HEENT: Head atraumatic, normocephalic. Oropharynx and nasopharynx clear.  NECK:  Supple, no jugular venous distention. No thyroid enlargement, no tenderness.  LUNGS: Normal breath sounds bilaterally, no wheezing, rales,rhonchi or crepitation. No use of accessory muscles of respiration.  CARDIOVASCULAR: S1, S2 normal. No murmurs, rubs, or gallops.  ABDOMEN: Soft, non-tender, non-distended. Bowel sounds present. No organomegaly or mass.  EXTREMITIES: No pedal edema, cyanosis, or clubbing.  NEUROLOGIC: Cranial nerves II through XII are intact.  Muscle strength 5/5 in all extremities. Sensation intact. Gait not checked.  PSYCHIATRIC: The patient is alert and oriented x 3.  SKIN: No obvious rash, lesion, or ulcer.   DATA REVIEW:   CBC  Recent Labs Lab 06/11/16 0506  WBC 7.3  HGB 10.0*  HCT 29.3*  PLT 135*    Chemistries   Recent Labs Lab 06/11/16 0506  06/15/16 0550  NA 139  --   --   K 3.5  --   --   CL 108  --   --   CO2 26  --   --   GLUCOSE 178*  --   --   BUN 29*  --   --   CREATININE 1.19*  < > 1.05*  CALCIUM 8.1*  --   --   MG 1.7  --   --   < > = values in this interval not displayed.  Cardiac Enzymes No results for input(s): TROPONINI in the last 168 hours.  Microbiology Results  Results for orders placed or performed during the hospital encounter of 06/08/16  Blood Culture (routine x 2)     Status: None   Collection Time: 06/08/16  6:38 AM  Result Value Ref Range Status   Specimen Description BLOOD  L WRIST  Final   Special Requests   Final    BOTTLES DRAWN AEROBIC AND ANAEROBIC  AER 10 ML ANA 10 ML   Culture NO GROWTH 5 DAYS  Final   Report Status 06/13/2016 FINAL  Final  Blood Culture (routine x 2)     Status: None   Collection Time: 06/08/16  6:38 AM   Result Value Ref Range Status   Specimen Description BLOOD  R HAND  Final   Special Requests   Final    BOTTLES DRAWN AEROBIC AND ANAEROBIC  AER 4 ML ANA 3 ML   Culture NO GROWTH 5 DAYS  Final   Report Status 06/13/2016 FINAL  Final  Urine culture     Status: None   Collection Time: 06/08/16  6:38 AM  Result Value Ref Range Status   Specimen Description URINE, RANDOM  Final   Special Requests NONE  Final   Culture NO GROWTH Performed at [facility]   Final   Report Status 06/09/2016 FINAL  Final  MRSA PCR Screening     Status: None   Collection Time: 06/08/16  6:33 PM  Result Value Ref Range Status   MRSA by PCR NEGATIVE NEGATIVE Final    Comment:        The GeneXpert MRSA Assay (FDA approved for NASAL specimens only), is one component of a comprehensive MRSA colonization surveillance program. It is not intended to diagnose MRSA infection nor to guide or monitor treatment for MRSA infections.   C difficile quick scan w PCR reflex     Status: Abnormal   Collection Time: 06/10/16  2:17 PM  Result Value Ref Range Status   C Diff antigen POSITIVE (A) NEGATIVE Final   C Diff toxin NEGATIVE NEGATIVE Final   C Diff interpretation Results are indeterminate. See PCR results.  Final  Clostridium Difficile by PCR     Status: Abnormal   Collection Time: 06/10/16  2:17 PM  Result Value Ref Range Status   Toxigenic C Difficile by pcr POSITIVE (A) NEGATIVE Final    Comment: Positive for toxigenic C. difficile with little to no toxin production. Only treat if clinical presentation suggests symptomatic illness.    RADIOLOGY:  US  Carotid Bilateral  Result Date: 06/11/2016 CLINICAL DATA:  Slurred speech, altered mental status, acute stroke symptoms EXAM: BILATERAL CAROTID DUPLEX ULTRASOUND TECHNIQUE: Wallace Cullens scale imaging, color Doppler and duplex ultrasound were performed of bilateral carotid and vertebral arteries in the neck. COMPARISON:  06/10/2016 brain MRI FINDINGS: Criteria:  Quantification of carotid stenosis is based on velocity parameters that correlate the residual internal carotid diameter with NASCET-based stenosis levels, using the diameter of the distal internal carotid lumen as the denominator for stenosis measurement. The following velocity measurements were obtained: RIGHT ICA:  131/22 cm/sec CCA:  73/13 cm/sec SYSTOLIC ICA/CCA RATIO:  1.79 DIASTOLIC ICA/CCA RATIO:  1.71 ECA:  417 cm/sec LEFT ICA:  204/33 cm/sec CCA:  116/24 cm/sec SYSTOLIC ICA/CCA RATIO:  1.76 DIASTOLIC ICA/CCA RATIO:  1.36 ECA:  135 cm/sec RIGHT CAROTID ARTERY: Moderately severe partially calcified right carotid bifurcation atherosclerosis. This significantly narrows the lumen of the right ECA with an elevated ECA origin velocity as above. Despite this, the right ICA remains patent with minor plaque formation. No hemodynamically significant right ICA stenosis, velocity elevation, or turbulent flow. Degree of right ICA narrowing is less than 50%. RIGHT VERTEBRAL ARTERY:  Antegrade LEFT CAROTID ARTERY: Similar moderate heterogeneous and partially calcified left carotid bifurcation atherosclerosis. Tortuous left ICA evident. No significant luminal narrowing by grayscale imaging. Tortuosity appears to result in an elevated velocity left ICA velocity measuring 204/32 cm/sec. Degree of stenosis still estimated less than 50%. LEFT VERTEBRAL ARTERY:  Antegrade IMPRESSION: Moderate bilateral carotid atherosclerosis and tortuosity, worse on the left. Tortuosity appears result in left ICA velocity elevation. Degree of ICA stenosis estimated less than 50% bilaterally. Patent antegrade vertebral flow bilaterally Electronically Signed   By: Judie Petit.  Shick M.D.   On: 06/11/2016 11:42    EKG:   Orders placed or performed during the hospital encounter of 06/08/16  . EKG 12-Lead  . EKG 12-Lead  . ED EKG 12-Lead  . ED EKG 12-Lead  . EKG 12-Lead  . EKG 12-Lead      Management plans discussed with the patient, family  and they are in agreement.  CODE STATUS:     Code Status Orders        Start     Ordered   06/08/16 2224  Do not attempt resuscitation (DNR)  Continuous    Question Answer Comment  In the event of cardiac or respiratory ARREST Do not call a "code blue"   In the event of cardiac or respiratory ARREST Do not perform Intubation, CPR, defibrillation or ACLS   In the event of cardiac or respiratory ARREST Use medication by any route, position, wound care, and other measures to relive pain and suffering. May use oxygen, suction and manual treatment of airway obstruction as needed for comfort.      06/08/16 2223    Code Status History    Date Active Date Inactive Code Status Order ID Comments User Context   06/08/2016  9:28 AM 06/08/2016 10:23 PM Full Code [id number]  Erin Fulling, MD ED   02/09/2016 12:58 AM 02/10/2016  2:50 PM Full Code [id number]  Oralia Manis, MD ED    Advance Directive Documentation   Flowsheet Row Most Recent Value  Type of Advance Directive  Healthcare Power of Attorney  Pre-existing out of facility DNR order (yellow form or pink MOST form)  No data  "MOST" Form in Place?  No data      TOTAL TIME TAKING CARE OF THIS PATIENT: 45 minutes.   Note: This dictation  was prepared with Dragon dictation along with smaller phrase technology. Any transcriptional errors that result from this process are unintentional.   @MEC @  on 06/15/2016 at 9:45 AM  Between 7am to 6pm - Pager - [phone]  After 6pm go to [email or web address] - password [email or web address] [facility]  [facility]  Office  [phone]  CC: Primary care physician; Mariam Dollar, MD

## 2016-06-14 NOTE — Progress Notes (Signed)
[facility] [place] at [facility]   PATIENT NAME: Julie DikeMary Burch    MR#:  [id number]  DATE OF BIRTH:  1927/02/17  SUBJECTIVE:  CHIEF COMPLAINT:   Chief Complaint  Patient presents with  . Code Sepsis   - Patient more alert and requesting to go home today.  -Patient with decreased by mouth intake and reporting weakness. More alert today according to the daughter. Tolerated breakfast  REVIEW OF SYSTEMS:  Review of Systems  Constitutional: Positive for malaise/fatigue. Negative for chills and fever.  HENT: Positive for hearing loss. Negative for congestion, ear discharge and nosebleeds.   Eyes: Negative for blurred vision and double vision.  Respiratory: Negative for cough, shortness of breath and wheezing.   Cardiovascular: Negative for chest pain, palpitations and leg swelling.  Gastrointestinal: Negative for abdominal pain, constipation, diarrhea, nausea and vomiting.  Genitourinary: Negative for dysuria.  Musculoskeletal: Negative for myalgias.  Neurological: Positive for speech change and focal weakness. Negative for dizziness, sensory change, seizures and headaches.  Psychiatric/Behavioral:       Confusion    DRUG ALLERGIES:   Allergies  Allergen Reactions  . Aspirin Other (See Comments)    Reaction:  Intracranial hemorrhage   . Ciprofloxacin Other (See Comments)    Reaction:  Hallucinations/severe agitation    . Diovan [Valsartan] Other (See Comments)    Reaction:  High potassium levels   . Fosamax [Alendronate Sodium] Other (See Comments)    Reaction:  GI upset   . Sulfa Antibiotics Hives    VITALS:  Blood pressure (!) 157/53, pulse 78, temperature 98.6 F (37 C), temperature source Oral, resp. rate 18, height 5\' 4"  (1.626 m), weight 55 kg (121 lb 4.8 oz), SpO2 95 %.  PHYSICAL EXAMINATION:  Physical Exam  GENERAL:  82 y.o.-year-old patient lying in the bed with no acute distress. Hard of hearing EYES: Pupils equal, round, reactive to  light and accommodation. No scleral icterus. Extraocular muscles intact.  HEENT: Head atraumatic, normocephalic. Oropharynx and nasopharynx clear.  NECK:  Supple, no jugular venous distention. No thyroid enlargement, no tenderness.  LUNGS: Normal breath sounds bilaterally, no wheezing, rales,rhonchi or crepitation. No use of accessory muscles of respiration. Decreased bibasilar breath sounds. CARDIOVASCULAR: S1, S2 normal. No  rubs, or gallops. 3/6 systolic murmur is present. ABDOMEN: Soft, nontender, nondistended. Bowel sounds present. No organomegaly or mass.  EXTREMITIES: No pedal edema, cyanosis, or clubbing.  NEUROLOGIC: slurred speech, no other cranial nerve deficits, communicating better Moving all extremities in bed- slightly weaker on right side when worked with PT yesterday, following simple commands PSYCHIATRIC: The patient is alert, oriented x 1-2, following simple commands, hard of hearing though SKIN: No obvious rash, lesion, or ulcer.    LABORATORY PANEL:   CBC  Recent Labs Lab 06/11/16 0506  WBC 7.3  HGB 10.0*  HCT 29.3*  PLT 135*   ------------------------------------------------------------------------------------------------------------------  Chemistries   Recent Labs Lab 06/08/16 0638  06/11/16 0506 06/14/16 0422  NA 128*  < > 139  --   K 6.5*  < > 3.5  --   CL 93*  < > 108  --   CO2 10*  < > 26  --   GLUCOSE 856*  < > 178*  --   BUN 46*  < > 29*  --   CREATININE 2.61*  < > 1.19* 0.91  CALCIUM 8.5*  < > 8.1*  --   MG  --   < > 1.7  --   AST  52*  --   --   --   ALT 19  --   --   --   ALKPHOS 65  --   --   --   BILITOT 1.4*  --   --   --   < > = values in this interval not displayed. ------------------------------------------------------------------------------------------------------------------  Cardiac Enzymes No results for input(s): TROPONINI in the last 168  hours. ------------------------------------------------------------------------------------------------------------------  RADIOLOGY:  No results found.  EKG:   Orders placed or performed during the hospital encounter of 06/08/16  . EKG 12-Lead  . EKG 12-Lead  . ED EKG 12-Lead  . ED EKG 12-Lead  . EKG 12-Lead  . EKG 12-Lead    ASSESSMENT AND PLAN:    82 year old female with past medical history significant for diabetes mellitus, GERD, depression, hypertension, history of stroke presents to hospital from assisted living facility secondary to altered mental statusFrom septic shock. Please review history and physical, progress note and consult note for details  #1 altered mental status-metabolic encephalopathy secondary to sepsis and also acute CVA - MRI brain with acute infract in left insular area, but has h/o chronic intracranial hemorrhages from previous strokes, likely from hypertension.. - swallow eval done- on puree diet and thin liquids with precautions -PT OT has recommended skilled nursing facility - neuro consulted to help with anticoagulation as patient had prior hemorrhagic strokes. -Started on oral Plavix along with statin as per neurology recommendations - carotid dopplers with atherosclerosis noted, with no hemodynamically significant stenosis. Echo is normal with EF of 65%. -No history of A. fib, but had atrial flutter when she got admitted. No stronger anticoagulation due to history of hemorrhagic strokes.-Will need rehabilitation at discharge  #2 septic shock-secondary to acute cystitis. -Urine cultures growing sensitive Escherichia coli. on Keflex now. Can be stopped after 7 days. -Blood cultures are negative.  #3 Diarrhea- Improving. - c.diff toxin negative, but pcr positive. on flagyl. Contact isolation Probiotics. Continue for 12 more days (total 14 days ) #4 acute renal failure-likely ATN from sepsis -Improving with fluids. Continue to monitor.  #4  diabetes mellitus-sugars were in 800s when she came in and was on insulin drip. Improved with fluids -On sliding scale at this time. -Lantus discontinued as patient was hypoglycemic this morning and also has poor oral intake. A1c is 8.9 -iss  #5 hypertension- blood pressure elevated increase the dose of Norvasc to 10 mg and titrate as needed   #6 DVT prophylaxis-on Lovenox  Physical therapy consulted-physical therapy has recommended skilled nursing facility According to the social worker it's too late to discharge the patient to a facility today as they might not be able to give her home medications tonight. We will cancel the discharge for safety  CODE STATUS: DO NOT RESUSCITATE  TOTAL TIME TAKING CARE OF THIS PATIENT: 37 minutes.   POSSIBLE D/C tomorrow, DEPENDING ON CLINICAL CONDITION.   Ramonita LabGouru, Ulah Olmo M.D on 06/14/2016 at 4:28 PM  Between 7am to 6pm - Pager - [phone]  After 6pm go to [email or web address] - Social research officer, [email or web address] [email or web address]  [facility] [place] [facility]  Office  [phone]  CC: Primary care physician; Mariam DollarPHELPS, ANNE F, MD

## 2016-06-14 NOTE — Progress Notes (Signed)
Physical Therapy Treatment Patient Details Name: Julie DikeMary Burch MRN: [id number] DOB: 10/14/1926 Today's Date: 06/14/2016    History of Present Illness Pt is an 82 y/o female that presents with AMS, found to have septic shock from recurrent UTI. MRI showed L insular infarct, chronic intracranial hemorrhages.     PT Comments    Pt lethargic; daughter in room attempting to feed pt. Pt agreeable to session. Speaks little, but does shake head "no" and "yes". Eyes remain closed throughout session. Pt participates in supine bed exercises with some increased verbal and tactile cueing to stay on task, but ultimately moves fairly well. Unable to comprehend isometric exercises. Continue PT to progress strength, endurance and participation to improve all functional mobility.   Follow Up Recommendations  SNF     Equipment Recommendations       Recommendations for Other Services       Precautions / Restrictions Precautions Precautions: Fall Restrictions Weight Bearing Restrictions: No    Mobility  Bed Mobility               General bed mobility comments: Not tested; pt does not open eyes throughout session, but does follow most instruction for exercises. Daughter in room attempting to feed her breakfast  pre/post session  Transfers                    Ambulation/Gait                 Stairs            Wheelchair Mobility    Modified Rankin (Stroke Patients Only)       Balance                                    Cognition Arousal/Alertness: Lethargic Behavior During Therapy: WFL for tasks assessed/performed Overall Cognitive Status: History of cognitive impairments - at baseline                      Exercises General Exercises - Lower Extremity Ankle Circles/Pumps: AROM;Both;10 reps;Supine Quad Sets: Other (comment) (Attempted, unable to follow isometrics) Short Arc Quad: AROM;Both;10 reps;Supine Heel Slides: AAROM;Both;10  reps;Supine Hip ABduction/ADduction: AAROM;Both;10 reps;Supine (assist only to hold leg up from shearing on sheets) Straight Leg Raises: AAROM;Both;10 reps;Supine    General Comments        Pertinent Vitals/Pain Pain Assessment: No/denies pain    Home Living                      Prior Function            PT Goals (current goals can now be found in the care plan section) Progress towards PT goals: Progressing toward goals    Frequency    7X/week      PT Plan Current plan remains appropriate    Co-evaluation             End of Session   Activity Tolerance: Patient tolerated treatment well;Patient limited by lethargy Patient left: in bed;with call bell/phone within reach;with bed alarm set;with family/visitor present     Time: [id number] PT Time Calculation (min) (ACUTE ONLY): 24 min  Charges:  $Therapeutic Exercise: 23-37 mins                    G C[place] Dock:      Nikolay Demetriou E Butler Vegh, PTA 06/14/2016, 10:20 AM

## 2016-06-14 NOTE — Progress Notes (Signed)
Speech Language Pathology Treatment: Dysphagia  Patient Details Name: Julie Burch MRN: [id number] DOB: [birth year] Today's Date: 06/14/2016 Time: [id number] SLP Time Calculation (min) (ACUTE ONLY): 40 min  Assessment / Plan / Recommendation Clinical Impression  Pt continues to appear at increased risk for aspiration moreso w/ thin liquids d/t drowsiness and overall weakness/illness. However, there is a baseline presentation of pt's as per Daughter's description. Daughter endorsed episodes of pt "coughing at home during meals and when drinking liquids" for some time but denied any Pulmonary deficits from such. But d/t pt's new change in status secondary to small Left infarct, hospitalization(more sedentary), and baseline Dementia/Cognitive decline(Global atrophy, extensive small vessel disease, and numerous areas of chronic intracranial hemorrhage), pt is at increased risk for dysphagia w/ aspiration and sequelae of such including Pneumonia. Oral phase of bolus management continues to be c/b min slower bolus manipulation w/ increased texture; noted slower lingual movements during tasks and speech as well, but pt was able to orally clear the Puree foods given extra time. Pt does benefit from a modified dsyphagia diet of Dysphagia 1 w/ thin liquids (per Daughter's request as she stated that pt "won't like the thickened liquids" when offered the Nectar consistency liquids) w/ strict aspiration precautions. Recommend continue to monitor need for change of liquid consistency to Nectar consistency if Pulmonary decline noted and further education w/ family. Daughter present stating pt is drinking a few more sips of liquids and eating more bites of food today as she fed her. Education discussed again on aspiration precautions and diet recommendations; food prep/options for pt. Dietician is following. ST will continue to f/u for toleration of diet; thin liquids as well as ongoing education w/ precautions and  strategies. Daughter agreed.   HPI HPI:  Pt is an 82 y/o female who has a past medical history of Cognitive decline per report; GERD; Depression; Diabetes mellitus without complication (HCC); GERD (gastroesophageal reflux disease); Hypertension; Recurrent UTI; and Stroke (HCC). She was recently treated 2 days ago for UTI; now admitted. Pt has been on a Dysphagia 1(puree) diet w/ thin liquids w/ intermittent throat clearing/cough noted by Daughter who has stated "this is usual for here".       SLP Plan  Continue with current plan of care     Recommendations  Diet recommendations: Dysphagia 1 (puree);Thin liquid Liquids provided via: Cup;No straw Medication Administration: Whole meds with puree (crushed if needed) Supervision: Full supervision/cueing for compensatory strategies;Trained caregiver to feed patient Compensations: Minimize environmental distractions;Slow rate;Small sips/bites;Lingual sweep for clearance of pocketing;Multiple dry swallows after each bite/sip;Follow solids with liquid;Clear throat intermittently (effortful cough at end of meals)                General recommendations:  (Dietician consult) Oral Care Recommendations: Oral care BID;Staff/trained caregiver to provide oral care Follow up Recommendations: Skilled Nursing facility Plan: Continue with current plan of care       GO                 Jerilynn SomKatherine Watson, MS, CCC-SLP Watson,Katherine 06/14/2016, 11:28 AM

## 2016-06-15 LAB — CREATININE, SERUM
CREATININE: 1.05 mg/dL — AB (ref 0.44–1.00)
GFR, EST AFRICAN AMERICAN: 54 mL/min — AB (ref >60)
GFR, EST NON AFRICAN AMERICAN: 47 mL/min — AB (ref >60)

## 2016-06-15 LAB — GLUCOSE, CAPILLARY
GLUCOSE-CAPILLARY: 236 mg/dL — AB (ref 65–99)
GLUCOSE-CAPILLARY: 295 mg/dL — AB (ref 65–99)
GLUCOSE-CAPILLARY: 324 mg/dL — AB (ref 65–99)

## 2016-06-15 MED ORDER — INSULIN GLARGINE 100 UNIT/ML ~~LOC~~ SOLN: 9.0000 [IU] | Freq: Every day | SUBCUTANEOUS | 11 refills | Status: DC

## 2016-06-15 MED ORDER — INSULIN GLARGINE 100 UNIT/ML ~~LOC~~ SOLN
6.0000 [IU] | Freq: Once | SUBCUTANEOUS | Status: AC
  Administered 2016-06-15: 6 [IU] via SUBCUTANEOUS
  Filled 2016-06-15: qty 0.06

## 2016-06-15 MED ORDER — GABAPENTIN 100 MG PO CAPS: 100.0000 mg | ORAL_CAPSULE | Freq: Three times a day (TID) | ORAL | 0 refills | Status: DC

## 2016-06-15 NOTE — Progress Notes (Signed)
Patient left the unit with EMS transport. Vital signs stable. Belongings taken by daughter- Dentures (upper and lower) in patient's mouth upon departure.

## 2016-06-15 NOTE — Progress Notes (Signed)
Report called to [facility], [phone]. Report given to [place], LPN. Patient going to room 810. Per LPN, room is ready. Transport called and will arrive as soon as possible.

## 2016-06-15 NOTE — Clinical Social Work Note (Signed)
Patient to be d/c'ed today to [facility].  Patient and family agreeable to plans will transport via ems RN to call report to room 810 [phone].  Windell MouldingEric Zakye Baby, MSW Mon-Fri 8a-4:30p [phone]

## 2016-06-15 NOTE — Clinical Social Work Note (Addendum)
[facility], MSW updated ALF that patient will be going to [facility] for short term rehab before returning back to ALF.  Ervin KnackEric R. Gloris Shiroma, MSW [phone]  Mon-Fri 8a-4:30p 06/15/2016 9:22 AM

## 2016-06-15 NOTE — Clinical Social Work Placement (Signed)
   CLINICAL SOCIAL WORK PLACEMENT  NOTE  Date:  06/15/2016  Patient Details  Name: Julie Burch MRN: [id number] Date of Birth: 07-31-1926  Clinical Social Work is seeking post-discharge placement for this patient at the Skilled  Nursing Facility level of care (*CSW will initial, date and re-position this form in  chart as items are completed):  Yes   Patient/family provided with [facility] Clinical Social Work Department's list of facilities offering this level of care within the geographic area requested by the patient (or if unable, by the patient's family).  Yes   Patient/family informed of their freedom to choose among providers that offer the needed level of care, that participate in Medicare, Medicaid or managed care program needed by the patient, have an available bed and are willing to accept the patient.  Yes   Patient/family informed of 's ownership interest in [facility] and [facility], as well as of the fact that they are under no obligation to receive care at these facilities.  PASRR submitted to EDS on 06/13/16     PASRR number received on 06/13/16     Existing PASRR number confirmed on       FL2 transmitted to all facilities in geographic area requested by pt/family on 06/13/16     FL2 transmitted to all facilities within larger geographic area on       Patient informed that his/her managed care company has contracts with or will negotiate with certain facilities, including the following:        Yes   Patient/family informed of bed offers received.  Patient chooses bed at [facility]     Physician recommends and patient chooses bed at      Patient to be transferred to [facility] on 06/15/16.  Patient to be transferred to facility by [facility]     Patient family notified on 06/15/16 of transfer.  Name of family member notified:  Roque CashSandra Toler patient's daughter who was at bedside.     PHYSICIAN Please  sign DNR, Please sign FL2     Additional Comment:    _______________________________________________ Darleene CleaverAnterhaus, Gordan Grell R 06/15/2016, 12:10 PM

## 2016-06-21 ENCOUNTER — Encounter: Payer: Self-pay | Admitting: Emergency Medicine

## 2016-06-21 ENCOUNTER — Inpatient Hospital Stay
Admission: EM | Admit: 2016-06-21 | Discharge: 2016-06-28 | DRG: 371 | Payer: Medicare Other | Attending: Internal Medicine | Admitting: Internal Medicine

## 2016-06-21 ENCOUNTER — Emergency Department: Payer: Medicare Other

## 2016-06-21 DIAGNOSIS — Z882 Allergy status to sulfonamides status: Secondary | ICD-10-CM

## 2016-06-21 DIAGNOSIS — R410 Disorientation, unspecified: Secondary | ICD-10-CM

## 2016-06-21 DIAGNOSIS — I480 Paroxysmal atrial fibrillation: Secondary | ICD-10-CM | POA: Diagnosis present

## 2016-06-21 DIAGNOSIS — R471 Dysarthria and anarthria: Secondary | ICD-10-CM

## 2016-06-21 DIAGNOSIS — Z7189 Other specified counseling: Secondary | ICD-10-CM | POA: Diagnosis not present

## 2016-06-21 DIAGNOSIS — I5031 Acute diastolic (congestive) heart failure: Secondary | ICD-10-CM

## 2016-06-21 DIAGNOSIS — Z79899 Other long term (current) drug therapy: Secondary | ICD-10-CM | POA: Diagnosis not present

## 2016-06-21 DIAGNOSIS — E876 Hypokalemia: Secondary | ICD-10-CM | POA: Diagnosis present

## 2016-06-21 DIAGNOSIS — E119 Type 2 diabetes mellitus without complications: Secondary | ICD-10-CM | POA: Diagnosis present

## 2016-06-21 DIAGNOSIS — Z7902 Long term (current) use of antithrombotics/antiplatelets: Secondary | ICD-10-CM

## 2016-06-21 DIAGNOSIS — Z66 Do not resuscitate: Secondary | ICD-10-CM | POA: Diagnosis present

## 2016-06-21 DIAGNOSIS — I4892 Unspecified atrial flutter: Secondary | ICD-10-CM | POA: Diagnosis present

## 2016-06-21 DIAGNOSIS — M25441 Effusion, right hand: Secondary | ICD-10-CM

## 2016-06-21 DIAGNOSIS — Z9889 Other specified postprocedural states: Secondary | ICD-10-CM | POA: Diagnosis not present

## 2016-06-21 DIAGNOSIS — K219 Gastro-esophageal reflux disease without esophagitis: Secondary | ICD-10-CM | POA: Diagnosis present

## 2016-06-21 DIAGNOSIS — A0472 Enterocolitis due to Clostridium difficile, not specified as recurrent: Secondary | ICD-10-CM | POA: Diagnosis not present

## 2016-06-21 DIAGNOSIS — B3749 Other urogenital candidiasis: Secondary | ICD-10-CM | POA: Diagnosis present

## 2016-06-21 DIAGNOSIS — F0390 Unspecified dementia without behavioral disturbance: Secondary | ICD-10-CM | POA: Diagnosis present

## 2016-06-21 DIAGNOSIS — L8990 Pressure ulcer of unspecified site, unspecified stage: Secondary | ICD-10-CM | POA: Insufficient documentation

## 2016-06-21 DIAGNOSIS — Z794 Long term (current) use of insulin: Secondary | ICD-10-CM | POA: Diagnosis not present

## 2016-06-21 DIAGNOSIS — R4182 Altered mental status, unspecified: Secondary | ICD-10-CM

## 2016-06-21 DIAGNOSIS — W06XXXA Fall from bed, initial encounter: Secondary | ICD-10-CM | POA: Diagnosis present

## 2016-06-21 DIAGNOSIS — I4891 Unspecified atrial fibrillation: Secondary | ICD-10-CM

## 2016-06-21 DIAGNOSIS — R1310 Dysphagia, unspecified: Secondary | ICD-10-CM

## 2016-06-21 DIAGNOSIS — N390 Urinary tract infection, site not specified: Secondary | ICD-10-CM | POA: Diagnosis present

## 2016-06-21 DIAGNOSIS — Z7901 Long term (current) use of anticoagulants: Secondary | ICD-10-CM

## 2016-06-21 DIAGNOSIS — R0602 Shortness of breath: Secondary | ICD-10-CM

## 2016-06-21 DIAGNOSIS — Y92129 Unspecified place in nursing home as the place of occurrence of the external cause: Secondary | ICD-10-CM | POA: Diagnosis not present

## 2016-06-21 DIAGNOSIS — R296 Repeated falls: Secondary | ICD-10-CM | POA: Diagnosis present

## 2016-06-21 DIAGNOSIS — R4701 Aphasia: Secondary | ICD-10-CM

## 2016-06-21 DIAGNOSIS — R52 Pain, unspecified: Secondary | ICD-10-CM

## 2016-06-21 DIAGNOSIS — I10 Essential (primary) hypertension: Secondary | ICD-10-CM | POA: Diagnosis present

## 2016-06-21 DIAGNOSIS — N179 Acute kidney failure, unspecified: Secondary | ICD-10-CM | POA: Diagnosis present

## 2016-06-21 DIAGNOSIS — Z87891 Personal history of nicotine dependence: Secondary | ICD-10-CM | POA: Diagnosis not present

## 2016-06-21 DIAGNOSIS — F329 Major depressive disorder, single episode, unspecified: Secondary | ICD-10-CM | POA: Diagnosis present

## 2016-06-21 DIAGNOSIS — Z888 Allergy status to other drugs, medicaments and biological substances status: Secondary | ICD-10-CM

## 2016-06-21 DIAGNOSIS — S0003XA Contusion of scalp, initial encounter: Secondary | ICD-10-CM | POA: Diagnosis present

## 2016-06-21 DIAGNOSIS — R7989 Other specified abnormal findings of blood chemistry: Secondary | ICD-10-CM | POA: Diagnosis present

## 2016-06-21 DIAGNOSIS — Z515 Encounter for palliative care: Secondary | ICD-10-CM

## 2016-06-21 DIAGNOSIS — R778 Other specified abnormalities of plasma proteins: Secondary | ICD-10-CM | POA: Diagnosis present

## 2016-06-21 DIAGNOSIS — I248 Other forms of acute ischemic heart disease: Secondary | ICD-10-CM | POA: Diagnosis present

## 2016-06-21 DIAGNOSIS — E039 Hypothyroidism, unspecified: Secondary | ICD-10-CM | POA: Diagnosis present

## 2016-06-21 DIAGNOSIS — I69351 Hemiplegia and hemiparesis following cerebral infarction affecting right dominant side: Secondary | ICD-10-CM | POA: Diagnosis not present

## 2016-06-21 DIAGNOSIS — I110 Hypertensive heart disease with heart failure: Secondary | ICD-10-CM | POA: Diagnosis present

## 2016-06-21 DIAGNOSIS — R627 Adult failure to thrive: Secondary | ICD-10-CM | POA: Diagnosis present

## 2016-06-21 DIAGNOSIS — R748 Abnormal levels of other serum enzymes: Secondary | ICD-10-CM | POA: Diagnosis not present

## 2016-06-21 DIAGNOSIS — R29810 Facial weakness: Secondary | ICD-10-CM | POA: Diagnosis present

## 2016-06-21 DIAGNOSIS — Z87440 Personal history of urinary (tract) infections: Secondary | ICD-10-CM | POA: Diagnosis not present

## 2016-06-21 DIAGNOSIS — J90 Pleural effusion, not elsewhere classified: Secondary | ICD-10-CM

## 2016-06-21 DIAGNOSIS — M6281 Muscle weakness (generalized): Secondary | ICD-10-CM

## 2016-06-21 LAB — BASIC METABOLIC PANEL
Anion gap: 9 (ref 5–15)
BUN: 24 mg/dL — AB (ref 6–20)
CALCIUM: 8.3 mg/dL — AB (ref 8.9–10.3)
CHLORIDE: 110 mmol/L (ref 101–111)
CO2: 22 mmol/L (ref 22–32)
CREATININE: 1.18 mg/dL — AB (ref 0.44–1.00)
GFR calc non Af Amer: 40 mL/min — ABNORMAL LOW (ref >60)
GFR, EST AFRICAN AMERICAN: 47 mL/min — AB (ref >60)
GLUCOSE: 131 mg/dL — AB (ref 65–99)
Potassium: 3.1 mmol/L — ABNORMAL LOW (ref 3.5–5.1)
Sodium: 141 mmol/L (ref 135–145)

## 2016-06-21 LAB — URINALYSIS, COMPLETE (UACMP) WITH MICROSCOPIC
Bilirubin Urine: NEGATIVE
Glucose, UA: >=500 mg/dL — AB
HGB URINE DIPSTICK: NEGATIVE
KETONES UR: 5 mg/dL — AB
Nitrite: NEGATIVE
PROTEIN: 30 mg/dL — AB
SQUAMOUS EPITHELIAL / LPF: NONE SEEN
Specific Gravity, Urine: 1.023 (ref 1.005–1.030)
pH: 6 (ref 5.0–8.0)

## 2016-06-21 LAB — POTASSIUM: POTASSIUM: 3.9 mmol/L (ref 3.5–5.1)

## 2016-06-21 LAB — COMPREHENSIVE METABOLIC PANEL
ALBUMIN: 3.1 g/dL — AB (ref 3.5–5.0)
ALK PHOS: 72 U/L (ref 38–126)
ALT: 19 U/L (ref 14–54)
AST: 37 U/L (ref 15–41)
Anion gap: 8 (ref 5–15)
BILIRUBIN TOTAL: 0.4 mg/dL (ref 0.3–1.2)
BUN: 24 mg/dL — AB (ref 6–20)
CALCIUM: 8.8 mg/dL — AB (ref 8.9–10.3)
CO2: 25 mmol/L (ref 22–32)
Chloride: 105 mmol/L (ref 101–111)
Creatinine, Ser: 1.38 mg/dL — ABNORMAL HIGH (ref 0.44–1.00)
GFR calc Af Amer: 39 mL/min — ABNORMAL LOW (ref >60)
GFR calc non Af Amer: 33 mL/min — ABNORMAL LOW (ref >60)
GLUCOSE: 269 mg/dL — AB (ref 65–99)
Potassium: 2.4 mmol/L — CL (ref 3.5–5.1)
Sodium: 138 mmol/L (ref 135–145)
TOTAL PROTEIN: 6.2 g/dL — AB (ref 6.5–8.1)

## 2016-06-21 LAB — CBC
HCT: 28.1 % — ABNORMAL LOW (ref 35.0–47.0)
HEMATOCRIT: 30.4 % — AB (ref 35.0–47.0)
HEMOGLOBIN: 10 g/dL — AB (ref 12.0–16.0)
Hemoglobin: 9.5 g/dL — ABNORMAL LOW (ref 12.0–16.0)
MCH: 30.8 pg (ref 26.0–34.0)
MCH: 32 pg (ref 26.0–34.0)
MCHC: 33 g/dL (ref 32.0–36.0)
MCHC: 33.8 g/dL (ref 32.0–36.0)
MCV: 93.3 fL (ref 80.0–100.0)
MCV: 94.5 fL (ref 80.0–100.0)
Platelets: 248 10*3/uL (ref 150–440)
Platelets: 228 10*3/uL (ref 150–440)
RBC: 3.26 MIL/uL — AB (ref 3.80–5.20)
RBC: 2.97 MIL/uL — ABNORMAL LOW (ref 3.80–5.20)
RDW: 16.1 % — ABNORMAL HIGH (ref 11.5–14.5)
RDW: 16.2 % — ABNORMAL HIGH (ref 11.5–14.5)
WBC: 5.5 10*3/uL (ref 3.6–11.0)
WBC: 5.5 10*3/uL (ref 3.6–11.0)

## 2016-06-21 LAB — TROPONIN I
TROPONIN I: 0.29 ng/mL — AB (ref <0.03)
Troponin I: 0.22 ng/mL (ref <0.03)
Troponin I: 0.22 ng/mL (ref <0.03)
Troponin I: 0.25 ng/mL (ref <0.03)

## 2016-06-21 LAB — LIPID PANEL
Cholesterol: 93 mg/dL (ref 0–200)
HDL: 40 mg/dL — ABNORMAL LOW (ref >40)
LDL CALC: 38 mg/dL (ref 0–99)
TRIGLYCERIDES: 73 mg/dL (ref <150)
Total CHOL/HDL Ratio: 2.3 RATIO
VLDL: 15 mg/dL (ref 0–40)

## 2016-06-21 LAB — GLUCOSE, CAPILLARY
GLUCOSE-CAPILLARY: 246 mg/dL — AB (ref 65–99)
Glucose-Capillary: 234 mg/dL — ABNORMAL HIGH (ref 65–99)
Glucose-Capillary: 231 mg/dL — ABNORMAL HIGH (ref 65–99)

## 2016-06-21 LAB — MAGNESIUM: MAGNESIUM: 1.7 mg/dL (ref 1.7–2.4)

## 2016-06-21 MED ORDER — AMLODIPINE BESYLATE 10 MG PO TABS
10.0000 mg | ORAL_TABLET | Freq: Every day | ORAL | Status: DC
  Administered 2016-06-21: 10 mg via ORAL
  Filled 2016-06-21: qty 1

## 2016-06-21 MED ORDER — SODIUM CHLORIDE 0.9 % IV SOLN: 250.0000 mL | INTRAVENOUS | Status: DC | PRN

## 2016-06-21 MED ORDER — SODIUM CHLORIDE 0.9 % IV SOLN
30.0000 meq | Freq: Once | INTRAVENOUS | Status: AC
  Administered 2016-06-21: 30 meq via INTRAVENOUS
  Filled 2016-06-21: qty 15

## 2016-06-21 MED ORDER — DILTIAZEM HCL 25 MG/5ML IV SOLN
5.0000 mg | Freq: Once | INTRAVENOUS | Status: AC
  Administered 2016-06-21: 5 mg via INTRAVENOUS
  Filled 2016-06-21: qty 5

## 2016-06-21 MED ORDER — HYDRALAZINE HCL 50 MG PO TABS
50.0000 mg | ORAL_TABLET | Freq: Two times a day (BID) | ORAL | Status: DC
  Administered 2016-06-21 (×2): 50 mg via ORAL
  Filled 2016-06-21 (×2): qty 1

## 2016-06-21 MED ORDER — PANTOPRAZOLE SODIUM 40 MG PO TBEC
40.0000 mg | DELAYED_RELEASE_TABLET | Freq: Every day | ORAL | Status: DC
  Administered 2016-06-21 – 2016-06-26 (×6): 40 mg via ORAL
  Filled 2016-06-21 (×6): qty 1

## 2016-06-21 MED ORDER — DILTIAZEM HCL 60 MG PO TABS
60.0000 mg | ORAL_TABLET | Freq: Three times a day (TID) | ORAL | Status: DC
  Administered 2016-06-21 – 2016-06-23 (×5): 60 mg via ORAL
  Filled 2016-06-21 (×5): qty 1

## 2016-06-21 MED ORDER — VITAMIN D 1000 UNITS PO TABS
2000.0000 [IU] | ORAL_TABLET | Freq: Every day | ORAL | Status: DC
  Administered 2016-06-21 – 2016-06-26 (×6): 2000 [IU] via ORAL
  Filled 2016-06-21 (×6): qty 2

## 2016-06-21 MED ORDER — DONEPEZIL HCL 5 MG PO TABS: 5.0000 mg | ORAL_TABLET | Freq: Every day | ORAL | Status: DC

## 2016-06-21 MED ORDER — RISAQUAD PO CAPS
1.0000 | ORAL_CAPSULE | Freq: Two times a day (BID) | ORAL | Status: DC
  Administered 2016-06-21 – 2016-06-26 (×11): 1 via ORAL
  Filled 2016-06-21 (×11): qty 1

## 2016-06-21 MED ORDER — ACETAMINOPHEN 325 MG PO TABS: 650.0000 mg | ORAL_TABLET | ORAL | Status: DC | PRN

## 2016-06-21 MED ORDER — SODIUM CHLORIDE 0.9% FLUSH
3.0000 mL | Freq: Two times a day (BID) | INTRAVENOUS | Status: DC
  Administered 2016-06-22 – 2016-06-28 (×10): 3 mL via INTRAVENOUS

## 2016-06-21 MED ORDER — SERTRALINE HCL 50 MG PO TABS
150.0000 mg | ORAL_TABLET | Freq: Every day | ORAL | Status: DC
  Administered 2016-06-21 – 2016-06-26 (×6): 150 mg via ORAL
  Filled 2016-06-21 (×7): qty 1

## 2016-06-21 MED ORDER — GABAPENTIN 100 MG PO CAPS
100.0000 mg | ORAL_CAPSULE | Freq: Three times a day (TID) | ORAL | Status: DC
  Administered 2016-06-21 – 2016-06-26 (×16): 100 mg via ORAL
  Filled 2016-06-21 (×16): qty 1

## 2016-06-21 MED ORDER — COQ-10 100 MG PO CAPS: 100.0000 mg | ORAL_CAPSULE | Freq: Every day | ORAL | Status: DC

## 2016-06-21 MED ORDER — FAMOTIDINE 20 MG PO TABS
20.0000 mg | ORAL_TABLET | ORAL | Status: DC
  Administered 2016-06-22 – 2016-06-26 (×3): 20 mg via ORAL
  Filled 2016-06-21 (×3): qty 1

## 2016-06-21 MED ORDER — ATORVASTATIN CALCIUM 20 MG PO TABS
40.0000 mg | ORAL_TABLET | Freq: Every day | ORAL | Status: DC
  Administered 2016-06-21 – 2016-06-25 (×5): 40 mg via ORAL
  Filled 2016-06-21 (×5): qty 2

## 2016-06-21 MED ORDER — CLOPIDOGREL BISULFATE 75 MG PO TABS
75.0000 mg | ORAL_TABLET | Freq: Every day | ORAL | Status: DC
  Administered 2016-06-21: 75 mg via ORAL
  Filled 2016-06-21: qty 1

## 2016-06-21 MED ORDER — NITROGLYCERIN 0.4 MG SL SUBL: 0.4000 mg | SUBLINGUAL_TABLET | SUBLINGUAL | Status: DC | PRN

## 2016-06-21 MED ORDER — ENOXAPARIN SODIUM 60 MG/0.6ML ~~LOC~~ SOLN
50.0000 mg | SUBCUTANEOUS | Status: DC
  Administered 2016-06-21 – 2016-06-23 (×3): 50 mg via SUBCUTANEOUS
  Filled 2016-06-21 (×3): qty 0.6

## 2016-06-21 MED ORDER — INSULIN ASPART 100 UNIT/ML ~~LOC~~ SOLN
0.0000 [IU] | Freq: Three times a day (TID) | SUBCUTANEOUS | Status: DC
  Administered 2016-06-21 (×2): 3 [IU] via SUBCUTANEOUS
  Filled 2016-06-21 (×2): qty 3

## 2016-06-21 MED ORDER — ORAL CARE MOUTH RINSE
15.0000 mL | Freq: Two times a day (BID) | OROMUCOSAL | Status: DC
  Administered 2016-06-21 – 2016-06-28 (×12): 15 mL via OROMUCOSAL

## 2016-06-21 MED ORDER — SODIUM CHLORIDE 0.9% FLUSH: 3.0000 mL | INTRAVENOUS | Status: DC | PRN

## 2016-06-21 MED ORDER — LEVOTHYROXINE SODIUM 50 MCG PO TABS
50.0000 ug | ORAL_TABLET | Freq: Every day | ORAL | Status: DC
  Administered 2016-06-21 – 2016-06-26 (×6): 50 ug via ORAL
  Filled 2016-06-21 (×6): qty 1

## 2016-06-21 MED ORDER — ONDANSETRON HCL 4 MG/2ML IJ SOLN
4.0000 mg | Freq: Four times a day (QID) | INTRAMUSCULAR | Status: DC | PRN
  Administered 2016-06-28: 4 mg via INTRAVENOUS
  Filled 2016-06-21: qty 2

## 2016-06-21 MED ORDER — SODIUM CHLORIDE 0.9 % IV SOLN
INTRAVENOUS | Status: DC
  Administered 2016-06-21 – 2016-06-25 (×5): via INTRAVENOUS

## 2016-06-21 MED ORDER — POTASSIUM CHLORIDE 20 MEQ PO PACK: 40.0000 meq | PACK | Freq: Two times a day (BID) | ORAL | Status: DC

## 2016-06-21 MED ORDER — CEPHALEXIN 500 MG PO CAPS
500.0000 mg | ORAL_CAPSULE | Freq: Three times a day (TID) | ORAL | Status: DC
  Administered 2016-06-21 – 2016-06-22 (×4): 500 mg via ORAL
  Filled 2016-06-21 (×4): qty 1

## 2016-06-21 MED ORDER — QUETIAPINE FUMARATE 100 MG PO TABS
150.0000 mg | ORAL_TABLET | Freq: Every day | ORAL | Status: DC
  Administered 2016-06-21 – 2016-06-25 (×5): 150 mg via ORAL
  Filled 2016-06-21: qty 2
  Filled 2016-06-21 (×3): qty 3
  Filled 2016-06-21: qty 2
  Filled 2016-06-21: qty 3
  Filled 2016-06-21: qty 2

## 2016-06-21 MED ORDER — VITAMIN B-12 1000 MCG PO TABS
1000.0000 ug | ORAL_TABLET | Freq: Every day | ORAL | Status: DC
  Administered 2016-06-21 – 2016-06-26 (×6): 1000 ug via ORAL
  Filled 2016-06-21 (×6): qty 1

## 2016-06-21 NOTE — ED Triage Notes (Signed)
Per acems: pt from [facility] where completing stroke rehab, unwitnessed fall. Unknown downtime found on back. Pt. States fell from edge of bed face first.

## 2016-06-21 NOTE — ED Notes (Signed)
Patient is resting comfortably at this time with no signs of distress present. VS stable. Will continue to monitor.   

## 2016-06-21 NOTE — ED Provider Notes (Signed)
[facility] Emergency Department Provider Note   First MD Initiated Contact with Patient 06/21/16 0017     (approximate)  I have reviewed the triage vital signs and the nursing notes.   HISTORY  Chief Complaint Fall    HPI Julie Burch is a 82 y.o. female presents via EMS with history of "I was sitting on the side of my bed and lost my balance". Patient states that she struck her forehead when she fell. Patient denies any loss of consciousness. Patient denies any other complaints.   Past Medical History:  Diagnosis Date  . Depression   . Diabetes mellitus without complication (HCC)   . GERD (gastroesophageal reflux disease)   . Hypertension   . Recurrent UTI   . Stroke [facility](HCC)    TIA    Patient Active Problem List   Diagnosis Date Noted  . Hypokalemia 06/21/2016  . Non-STEMI (non-ST elevated myocardial infarction) (HCC) 06/21/2016  . Septic shock (HCC) 06/08/2016  . Recurrent UTI 02/09/2016  . GERD (gastroesophageal reflux disease) 02/09/2016  . HTN (hypertension) 02/09/2016  . Hypothyroidism 02/09/2016  . Depression 02/09/2016  . Acute on chronic kidney failure (HCC) 02/09/2016  . Diabetes (HCC) 02/09/2016    Past Surgical History:  Procedure Laterality Date  . TONSILLECTOMY      Prior to Admission medications   Medication Sig Start Date End Date Taking? Authorizing Provider  acetaminophen (TYLENOL) 500 MG tablet Take 1,000 mg by mouth every 4 (four) hours as needed for mild pain or headache.   Yes Historical Provider, MD  acidophilus (RISAQUAD) CAPS capsule Take 1 capsule by mouth 2 (two) times daily. 06/14/16  Yes Ramonita LabAruna Gouru, MD  amLODipine (NORVASC) 10 MG tablet Take 1 tablet (10 mg total) by mouth daily. 06/15/16  Yes Ramonita LabAruna Gouru, MD  atorvastatin (LIPITOR) 40 MG tablet Take 1 tablet (40 mg total) by mouth daily at 6 PM. 06/14/16  Yes Ramonita LabAruna Gouru, MD  cephALEXin (KEFLEX) 500 MG capsule Take 1 capsule (500 mg total) by mouth 3 (three)  times daily. 06/14/16 07/05/16 Yes Ramonita LabAruna Gouru, MD  cholecalciferol (VITAMIN D) 1000 units tablet Take 2,000 Units by mouth daily.   Yes Historical Provider, MD  clopidogrel (PLAVIX) 75 MG tablet Take 1 tablet (75 mg total) by mouth daily. 06/15/16  Yes Ramonita LabAruna Gouru, MD  Coenzyme Q10 (COQ-10) 100 MG CAPS Take 100 mg by mouth daily.   Yes Historical Provider, MD  donepezil (ARICEPT) 5 MG tablet Take 5 mg by mouth at bedtime.   Yes Historical Provider, MD  esomeprazole (NEXIUM) 40 MG capsule Take 40 mg by mouth daily before breakfast.   Yes Historical Provider, MD  famotidine (PEPCID) 20 MG tablet Take 1 tablet (20 mg total) by mouth every other day. 06/16/16  Yes Ramonita LabAruna Gouru, MD  fosfomycin (MONUROL) 3 g PACK Take 3 g by mouth See admin instructions. Pt is to take once every ten days.   Yes Historical Provider, MD  glucose 4 GM chewable tablet Chew 4 tablets by mouth as needed (for blood sugar less than 80).   Yes Historical Provider, MD  hydrALAZINE (APRESOLINE) 50 MG tablet Take 50 mg by mouth 2 (two) times daily.   Yes Historical Provider, MD  insulin glargine (LANTUS) 100 UNIT/ML injection Inject 0.09 mLs (9 Units total) into the skin at bedtime. 06/16/16  Yes Ramonita LabAruna Gouru, MD  insulin lispro (HUMALOG) 100 UNIT/ML injection Inject 0.03-0.05 mLs (3-5 Units total) into the skin 3 (three) times daily with meals.  Pt injects 5 units before breakfast at 0900, 5 units before lunch at 1130, and 3 units before dinner at 1700. 06/14/16  Yes Ramonita Lab, MD  levothyroxine (SYNTHROID, LEVOTHROID) 50 MCG tablet Take 50 mcg by mouth daily before breakfast.   Yes Historical Provider, MD  metroNIDAZOLE (FLAGYL) 500 MG tablet Take 1 tablet (500 mg total) by mouth 3 (three) times daily. 06/14/16  Yes Ramonita Lab, MD  mouth rinse LIQD solution 15 mLs by Mouth Rinse route 2 (two) times daily. 06/14/16  Yes Ramonita Lab, MD  ondansetron (ZOFRAN) 4 MG tablet Take 4 mg by mouth every 8 (eight) hours as needed for nausea or  vomiting.   Yes Historical Provider, MD  QUEtiapine (SEROQUEL) 50 MG tablet Take 150 mg by mouth at bedtime.    Yes Historical Provider, MD  sertraline (ZOLOFT) 100 MG tablet Take 150 mg by mouth daily.   Yes Historical Provider, MD  vitamin B-12 (CYANOCOBALAMIN) 1000 MCG tablet Take 1,000 mcg by mouth daily.   Yes Historical Provider, MD    Allergies Sulfa antibiotics; Alendronate; Aspirin; Ciprofloxacin; Diovan [valsartan]; and Fosamax [alendronate sodium]  Family History  Problem Relation Age of Onset  . Family history unknown: Yes    Social History Social History  Substance Use Topics  . Smoking status: Former Games developer  . Smokeless tobacco: Never Used  . Alcohol use No    Review of Systems Constitutional: No fever/chills Eyes: No visual changes. ENT: No sore throat. Cardiovascular: Denies chest pain. Respiratory: Denies shortness of breath. Gastrointestinal: No abdominal pain.  No nausea, no vomiting.  No diarrhea.  No constipation. Genitourinary: Negative for dysuria. Musculoskeletal: Negative for back pain. Skin: Negative for rash. Neurological: Negative for headaches, focal weakness or numbness.  10-point ROS otherwise negative.  ____________________________________________   PHYSICAL EXAM:  VITAL SIGNS: ED Triage Vitals  Enc Vitals Group     BP 06/21/16 0010 (!) 122/58     Pulse Rate 06/21/16 0010 89     Resp 06/21/16 0010 17     Temp 06/21/16 0010 97.9 F (36.6 C)     Temp Source 06/21/16 0010 Oral     SpO2 06/21/16 0010 96 %     Weight 06/21/16 0013 122 lb (55.3 kg)     Height 06/21/16 0013 5\' 4"  (1.626 m)     Head Circumference --      Peak Flow --      Pain Score 06/21/16 0013 0     Pain Loc --      Pain Edu? --      Excl. in GC? --     Constitutional: Alert and oriented. Well appearing and in no acute distress. Eyes: Conjunctivae are normal. PERRL. EOMI. Head:Mid forehead abrasion noted bleeding controlled Ears:  Healthy appearing ear canals  and TMs bilaterally Nose: No congestion/rhinnorhea. Mouth/Throat: Mucous membranes are moist.  Oropharynx non-erythematous. Neck: No stridor. .  No cervical spine tenderness to palpation. Cardiovascular: Normal rate, regular rhythm. Good peripheral circulation. Grossly normal heart sounds. Respiratory: Normal respiratory effort.  No retractions. Lungs CTAB. Gastrointestinal: Soft and nontender. No distention.  Musculoskeletal: No lower extremity tenderness nor edema. No gross deformities of extremities. Neurologic:  Normal speech and language. No gross focal neurologic deficits are appreciated.  Skin:  Skin is warm, dry and intact. No rash noted. Mid forehead abrasion noted Psychiatric: Mood and affect are normal. Speech and behavior are normal.  ____________________________________________   LABS (all labs ordered are listed, but only abnormal results are displayed)  Labs Reviewed  CBC - Abnormal; Notable for the following:       Result Value   RBC 3.26 (*)    Hemoglobin 10.0 (*)    HCT 30.4 (*)    RDW 16.1 (*)    All other components within normal limits  COMPREHENSIVE METABOLIC PANEL - Abnormal; Notable for the following:    Potassium 2.4 (*)    Glucose, Bld 269 (*)    BUN 24 (*)    Creatinine, Ser 1.38 (*)    Calcium 8.8 (*)    Total Protein 6.2 (*)    Albumin 3.1 (*)    GFR calc non Af Amer 33 (*)    GFR calc Af Amer 39 (*)    All other components within normal limits  TROPONIN I - Abnormal; Notable for the following:    Troponin I 0.22 (*)    All other components within normal limits  URINALYSIS, COMPLETE (UACMP) WITH MICROSCOPIC - Abnormal; Notable for the following:    Color, Urine YELLOW (*)    APPearance HAZY (*)    Glucose, UA >=500 (*)    Ketones, ur 5 (*)    Protein, ur 30 (*)    Leukocytes, UA SMALL (*)    Bacteria, UA RARE (*)    All other components within normal limits  TROPONIN I  TROPONIN I  TROPONIN I  BASIC METABOLIC PANEL  LIPID PANEL  CBC    ____________________________________________  EKG  ED ECG REPORT I, Monroe N Kirandeep Fariss, the attending physician, personally viewed and interpreted this ECG.   Date: 06/21/2016  EKG Time:11:57PM  Rate: 124  Rhythm: Sinus tachycardia   Axis: Normal  Intervals: Normal  ST&T Change: None  ____________________________________________  RADIOLOGY I, Stinnett N Beryl Hornberger, personally viewed and evaluated these images (plain radiographs) as part of my medical decision making, as well as reviewing the written report by the radiologist.  Ct Head Wo Contrast  Result Date: 06/21/2016 CLINICAL DATA:  82 y/o  F; status post fall with forehead injury. EXAM: CT HEAD WITHOUT CONTRAST TECHNIQUE: Contiguous axial images were obtained from the base of the skull through the vertex without intravenous contrast. COMPARISON:  CT head dated 04/09/2016. FINDINGS: Brain: No evidence of acute infarction, hemorrhage, hydrocephalus, extra-axial collection or mass lesion/mass effect. Stable moderate chronic microvascular ischemic changes of subcortical and periventricular white matter and brain parenchymal volume loss. Vascular: No hyperdense vessel. Extensive calcific atherosclerosis of vertebral and internal carotid artery set skullbase. Skull: No displaced calvarial fracture. Small contusion of the left frontal scalp. Sinuses/Orbits: Visualized paranasal sinuses and mastoid air cells are normally aerated. Bilateral intra-ocular lens replacement. Other: None. IMPRESSION: 1. Small left frontal scalp contusion. No displaced calvarial fracture. No acute intracranial abnormality. 2. Stable moderate chronic microvascular ischemic changes and parenchymal volume loss of the brain. Electronically Signed   By: Mitzi Hansen M.D.   On: 06/21/2016 01:48   Dg Hip Unilat With Pelvis 2-3 Views Right  Result Date: 06/21/2016 CLINICAL DATA:  Under witnessed fall EXAM: DG HIP (WITH OR WITHOUT PELVIS) 2-3V RIGHT COMPARISON:   Hip radiograph 05/06/2016 FINDINGS: There is no evidence of hip fracture or dislocation. There is no evidence of advanced arthropathy or other focal bone abnormality. Extensive vascular calcifications are again seen. IMPRESSION: No fracture or dislocation of the right hip. Electronically Signed   By: Deatra Robinson M.D.   On: 06/21/2016 01:38     Procedures      INITIAL IMPRESSION / ASSESSMENT AND PLAN / ED  COURSE  Pertinent labs & imaging results that were available during my care of the patient were reviewed by me and considered in my medical decision making (see chart for details).     Clinical Course     ____________________________________________  FINAL CLINICAL IMPRESSION(S) / ED DIAGNOSES  Final diagnoses:  Pain  Hypokalemia Elevated troponin   MEDICATIONS GIVEN DURING THIS VISIT:  Medications  potassium chloride 30 mEq in sodium chloride 0.9 % 265 mL (KCL MULTIRUN) IVPB (30 mEq Intravenous Given 06/21/16 0350)  acidophilus (RISAQUAD) capsule 1 capsule (not administered)  amLODipine (NORVASC) tablet 10 mg (not administered)  atorvastatin (LIPITOR) tablet 40 mg (not administered)  cephALEXin (KEFLEX) capsule 500 mg (not administered)  clopidogrel (PLAVIX) tablet 75 mg (not administered)  famotidine (PEPCID) tablet 20 mg (not administered)  gabapentin (NEURONTIN) capsule 100 mg (not administered)  MEDLINE mouth rinse (not administered)  cholecalciferol (VITAMIN D) tablet 2,000 Units (not administered)  levothyroxine (SYNTHROID, LEVOTHROID) tablet 50 mcg (not administered)  QUEtiapine (SEROQUEL) tablet 150 mg (not administered)  sertraline (ZOLOFT) tablet 150 mg (not administered)  vitamin B-12 (CYANOCOBALAMIN) tablet 1,000 mcg (not administered)  donepezil (ARICEPT) tablet 5 mg (not administered)  pantoprazole (PROTONIX) EC tablet 40 mg (not administered)  hydrALAZINE (APRESOLINE) tablet 50 mg (not administered)  nitroGLYCERIN (NITROSTAT) SL tablet 0.4 mg (not  administered)  acetaminophen (TYLENOL) tablet 650 mg (not administered)  ondansetron (ZOFRAN) injection 4 mg (not administered)  sodium chloride flush (NS) 0.9 % injection 3 mL (not administered)  sodium chloride flush (NS) 0.9 % injection 3 mL (not administered)  0.9 %  sodium chloride infusion (not administered)  0.9 %  sodium chloride infusion ( Intravenous New Bag/Given 06/21/16 0545)  enoxaparin (LOVENOX) injection 50 mg (not administered)     NEW OUTPATIENT MEDICATIONS STARTED DURING THIS VISIT:  Current Discharge Medication List      Current Discharge Medication List      Current Discharge Medication List    STOP taking these medications     gabapentin (NEURONTIN) 100 MG capsule Comments:  Reason for Stopping:           Note:  This document was prepared using Dragon voice recognition software and may include unintentional dictation errors.    Darci Currentandolph N Kathlyne Loud, MD 06/21/16 [phone]

## 2016-06-21 NOTE — Progress Notes (Signed)
MEDICATION RELATED CONSULT NOTE - INITIAL   Pharmacy Consult for electrolyte monitoring Indication: hypokalemia  Allergies  Allergen Reactions  . Sulfa Antibiotics Hives and Other (See Comments)    Other Reaction: Allergy  . Alendronate Other (See Comments)    Other Reaction: GI UPSET  . Aspirin Other (See Comments)    Reaction:  Intracranial hemorrhage   . Ciprofloxacin Other (See Comments)    Reaction:  Hallucinations/severe agitation    . Diovan [Valsartan] Other (See Comments)    High potassium Reaction:  High potassium levels   . Fosamax [Alendronate Sodium] Other (See Comments)    Reaction:  GI upset     Patient Measurements: Height: 5\' 4"  (162.6 cm) Weight: 111 lb 3.2 oz (50.4 kg) IBW/kg (Calculated) : 54.7   Vital Signs: Temp: 98.1 F (36.7 C) (12/13 0828) Temp Source: Oral (12/13 0828) BP: 113/53 (12/13 0828) Pulse Rate: 97 (12/13 0828) Intake/Output from previous day: 12/12 0701 - 12/13 0700 In: 18.8 [I.V.:18.8] Out: 0  Intake/Output from this shift: No intake/output data recorded.  Labs:  Recent Labs  06/21/16 0012 06/21/16 0548  WBC 5.5 5.5  HGB 10.0* 9.5*  HCT 30.4* 28.1*  PLT 248 228  CREATININE 1.38* 1.18*  ALBUMIN 3.1*  --   PROT 6.2*  --   AST 37  --   ALT 19  --   ALKPHOS 72  --   BILITOT 0.4  --    Estimated Creatinine Clearance: 26.7 mL/min (by C-G formula based on SCr of 1.18 mg/dL (H)).  Assessment: 82 yo female with diarrhea PTA and K of 2.4 on admission  K 2.4 at 0012- received 30 mEq IV x1 K 3.1 at 0548 - checked during the time KCl bag would have been infusing - another KCl 30 mEq IV x1 given  Goal of Therapy:  K 3.5-5.0  Plan:  Will recheck K and check Mag at 1800 (concerned that K at 0548 may have been falsely elevated due to infusion) BMET ordered for AM as well   Pharmacy will continue to follow.  Crist FatWang, Amedee Cerrone L 06/21/2016,4:51 PM

## 2016-06-21 NOTE — H&P (Signed)
[facility] [place] [facility]   PATIENT NAME: Julie Burch    MR#:  [id number]  DATE OF BIRTH:  [birth year]  DATE OF ADMISSION:  06/21/2016  PRIMARY CARE PHYSICIAN: Mariam Dollar, MD   REQUESTING/REFERRING PHYSICIAN:   CHIEF COMPLAINT:   Chief Complaint  Patient presents with  . Fall    HISTORY OF PRESENT ILLNESS: Julie Burch  is a 82 y.o. female with a known history of Hypertension, recurrent urinary tract infection, CVA, depression, diabetes mellitus presented to the emergency room after she had a fall in the nursing home. Patient was trying to get out of the bed she lost balance and fell down. In the emergency room she is lethargic with decreased responsiveness not much history could be obtained from the patient. She has a contusion over the left side of the fore head secondary to fall. Workup in the emergency room showed elevated troponin and low potassium level. Oral and IV potassium supplementation was started. Not much history could be obtained from the patient.  PAST MEDICAL HISTORY:   Past Medical History:  Diagnosis Date  . Depression   . Diabetes mellitus without complication (HCC)   . GERD (gastroesophageal reflux disease)   . Hypertension   . Recurrent UTI   . Stroke [facility])    TIA    PAST SURGICAL HISTORY: Past Surgical History:  Procedure Laterality Date  . TONSILLECTOMY      SOCIAL HISTORY:  Social History  Substance Use Topics  . Smoking status: Former Games developer  . Smokeless tobacco: Never Used  . Alcohol use No    FAMILY HISTORY:  Family History  Problem Relation Age of Onset  . Family history unknown: Yes    DRUG ALLERGIES:  Allergies  Allergen Reactions  . Aspirin Other (See Comments)    Reaction:  Intracranial hemorrhage   . Ciprofloxacin Other (See Comments)    Reaction:  Hallucinations/severe agitation    . Diovan [Valsartan] Other (See Comments)    Reaction:  High potassium levels   . Fosamax [Alendronate  Sodium] Other (See Comments)    Reaction:  GI upset   . Sulfa Antibiotics Hives    REVIEW OF SYSTEMS:  Could not be obtained, very lethargic.  MEDICATIONS AT HOME:  Prior to Admission medications   Medication Sig Start Date End Date Taking? Authorizing Provider  acetaminophen (TYLENOL) 500 MG tablet Take 1,000 mg by mouth every 4 (four) hours as needed for mild pain or headache.    Historical Provider, MD  acidophilus (RISAQUAD) CAPS capsule Take 1 capsule by mouth 2 (two) times daily. 06/14/16   Ramonita Lab, MD  amLODipine (NORVASC) 10 MG tablet Take 1 tablet (10 mg total) by mouth daily. 06/15/16   Ramonita Lab, MD  atorvastatin (LIPITOR) 40 MG tablet Take 1 tablet (40 mg total) by mouth daily at 6 PM. 06/14/16   Ramonita Lab, MD  cephALEXin (KEFLEX) 500 MG capsule Take 1 capsule (500 mg total) by mouth 3 (three) times daily. 06/14/16 07/05/16  Ramonita Lab, MD  cholecalciferol (VITAMIN D) 1000 units tablet Take 2,000 Units by mouth daily.    Historical Provider, MD  clopidogrel (PLAVIX) 75 MG tablet Take 1 tablet (75 mg total) by mouth daily. 06/15/16   Ramonita Lab, MD  Coenzyme Q10 (COQ-10) 100 MG CAPS Take 100 mg by mouth daily.    Historical Provider, MD  donepezil (ARICEPT) 5 MG tablet Take 5 mg by mouth at bedtime.    Historical Provider, MD  esomeprazole (NEXIUM) 40 MG capsule Take 40 mg by mouth daily before breakfast.    Historical Provider, MD  famotidine (PEPCID) 20 MG tablet Take 1 tablet (20 mg total) by mouth every other day. 06/16/16   Ramonita Lab, MD  fosfomycin (MONUROL) 3 g PACK Take 3 g by mouth See admin instructions. Pt is to take once every ten days.    Historical Provider, MD  gabapentin (NEURONTIN) 100 MG capsule Take 1 capsule (100 mg total) by mouth 3 (three) times daily. 06/15/16   Ramonita Lab, MD  glucose 4 GM chewable tablet Chew 4 tablets by mouth as needed (for blood sugar less than 80).    Historical Provider, MD  hydrALAZINE (APRESOLINE) 50 MG tablet Take 50 mg by  mouth 2 (two) times daily.    Historical Provider, MD  insulin glargine (LANTUS) 100 UNIT/ML injection Inject 0.09 mLs (9 Units total) into the skin at bedtime. 06/16/16   Ramonita Lab, MD  insulin lispro (HUMALOG) 100 UNIT/ML injection Inject 0.03-0.05 mLs (3-5 Units total) into the skin 3 (three) times daily with meals. Pt injects 5 units before breakfast at 0900, 5 units before lunch at 1130, and 3 units before dinner at 1700. 06/14/16   Ramonita Lab, MD  levothyroxine (SYNTHROID, LEVOTHROID) 50 MCG tablet Take 50 mcg by mouth daily before breakfast.    Historical Provider, MD  metroNIDAZOLE (FLAGYL) 500 MG tablet Take 1 tablet (500 mg total) by mouth 3 (three) times daily. 06/14/16   Ramonita Lab, MD  mouth rinse LIQD solution 15 mLs by Mouth Rinse route 2 (two) times daily. 06/14/16   Ramonita Lab, MD  ondansetron (ZOFRAN) 4 MG tablet Take 4 mg by mouth every 8 (eight) hours as needed for nausea or vomiting.    Historical Provider, MD  QUEtiapine (SEROQUEL) 50 MG tablet Take 150 mg by mouth at bedtime.     Historical Provider, MD  sertraline (ZOLOFT) 100 MG tablet Take 150 mg by mouth daily.    Historical Provider, MD  vitamin B-12 (CYANOCOBALAMIN) 1000 MCG tablet Take 1,000 mcg by mouth daily.    Historical Provider, MD      PHYSICAL EXAMINATION:   VITAL SIGNS: Blood pressure 117/66, pulse 89, temperature 97.9 F (36.6 C), temperature source Oral, resp. rate 14, height 5\' 4"  (1.626 m), weight 55.3 kg (122 lb), SpO2 95 %.  GENERAL:  82 y.o.-year-old patient lying in the bed somnolent EYES: Pupils equal, round, reactive to light and accommodation. No scleral icterus. Extraocular muscles intact.  HEENT: Head  Normocephalic, contusion over the left fore head. Oropharynx and nasopharynx clear.  NECK:  Supple, no jugular venous distention. No thyroid enlargement, no tenderness.  LUNGS: Normal breath sounds bilaterally, no wheezing, rales,rhonchi or crepitation. No use of accessory muscles of  respiration.  CARDIOVASCULAR: S1, S2 normal. No murmurs, rubs, or gallops.  ABDOMEN: Soft, nontender, nondistended. Bowel sounds present. No organomegaly or mass.  EXTREMITIES: No pedal edema, cyanosis, or clubbing.  NEUROLOGIC: Arousable to pain ful stimuli. Moves extremities. PSYCHIATRIC: could not be assessed SKIN: No obvious rash, lesion, or ulcer.   LABORATORY PANEL:   CBC  Recent Labs Lab 06/21/16 0012  WBC 5.5  HGB 10.0*  HCT 30.4*  PLT 248  MCV 93.3  MCH 30.8  MCHC 33.0  RDW 16.1*   ------------------------------------------------------------------------------------------------------------------  Chemistries   Recent Labs Lab 06/14/16 0422 06/15/16 0550 06/21/16 0012  NA  --   --  138  K  --   --  2.4*  CL  --   --  105  CO2  --   --  25  GLUCOSE  --   --  269*  BUN  --   --  24*  CREATININE 0.91 1.05* 1.38*  CALCIUM  --   --  8.8*  AST  --   --  37  ALT  --   --  19  ALKPHOS  --   --  72  BILITOT  --   --  0.4   ------------------------------------------------------------------------------------------------------------------ estimated creatinine clearance is 24.8 mL/min (by C-G formula based on SCr of 1.38 mg/dL (H)). ------------------------------------------------------------------------------------------------------------------ No results for input(s): TSH, T4TOTAL, T3FREE, THYROIDAB in the last 72 hours.  Invalid input(s): FREET3   Coagulation profile No results for input(s): INR, PROTIME in the last 168 hours. ------------------------------------------------------------------------------------------------------------------- No results for input(s): DDIMER in the last 72 hours. -------------------------------------------------------------------------------------------------------------------  Cardiac Enzymes  Recent Labs Lab 06/21/16 0012  TROPONINI 0.22*    ------------------------------------------------------------------------------------------------------------------ Invalid input(s): POCBNP  ---------------------------------------------------------------------------------------------------------------  Urinalysis    Component Value Date/Time   COLORURINE YELLOW (A) 06/21/2016 0224   APPEARANCEUR HAZY (A) 06/21/2016 0224   APPEARANCEUR Hazy 08/16/2014 0739   LABSPEC 1.023 06/21/2016 0224   LABSPEC 1.018 08/16/2014 0739   PHURINE 6.0 06/21/2016 0224   GLUCOSEU >=500 (A) 06/21/2016 0224   GLUCOSEU Negative 08/16/2014 0739   HGBUR NEGATIVE 06/21/2016 0224   BILIRUBINUR NEGATIVE 06/21/2016 0224   BILIRUBINUR Negative 08/16/2014 0739   KETONESUR 5 (A) 06/21/2016 0224   PROTEINUR 30 (A) 06/21/2016 0224   NITRITE NEGATIVE 06/21/2016 0224   LEUKOCYTESUR SMALL (A) 06/21/2016 0224   LEUKOCYTESUR Negative 08/16/2014 0739     RADIOLOGY: Ct Head Wo Contrast  Result Date: 06/21/2016 CLINICAL DATA:  82 y/o  F; status post fall with forehead injury. EXAM: CT HEAD WITHOUT CONTRAST TECHNIQUE: Contiguous axial images were obtained from the base of the skull through the vertex without intravenous contrast. COMPARISON:  CT head dated 04/09/2016. FINDINGS: Brain: No evidence of acute infarction, hemorrhage, hydrocephalus, extra-axial collection or mass lesion/mass effect. Stable moderate chronic microvascular ischemic changes of subcortical and periventricular white matter and brain parenchymal volume loss. Vascular: No hyperdense vessel. Extensive calcific atherosclerosis of vertebral and internal carotid artery set skullbase. Skull: No displaced calvarial fracture. Small contusion of the left frontal scalp. Sinuses/Orbits: Visualized paranasal sinuses and mastoid air cells are normally aerated. Bilateral intra-ocular lens replacement. Other: None. IMPRESSION: 1. Small left frontal scalp contusion. No displaced calvarial fracture. No acute intracranial  abnormality. 2. Stable moderate chronic microvascular ischemic changes and parenchymal volume loss of the brain. Electronically Signed   By: Mitzi HansenLance  Furusawa-Stratton M.D.   On: 06/21/2016 01:48   Dg Hip Unilat With Pelvis 2-3 Views Right  Result Date: 06/21/2016 CLINICAL DATA:  Under witnessed fall EXAM: DG HIP (WITH OR WITHOUT PELVIS) 2-3V RIGHT COMPARISON:  Hip radiograph 05/06/2016 FINDINGS: There is no evidence of hip fracture or dislocation. There is no evidence of advanced arthropathy or other focal bone abnormality. Extensive vascular calcifications are again seen. IMPRESSION: No fracture or dislocation of the right hip. Electronically Signed   By: Deatra RobinsonKevin  Herman M.D.   On: 06/21/2016 01:38    EKG: Orders placed or performed during the hospital encounter of 06/08/16  . EKG 12-Lead  . EKG 12-Lead  . ED EKG 12-Lead  . ED EKG 12-Lead  . EKG 12-Lead  . EKG 12-Lead    IMPRESSION AND PLAN: 82 year old female patient with history of diabetes, hypertension, GERD, TIA presented to the emergency  room secondary to fall at nursing home. Patient not a great historian. Workup in the emergency room showed elevated troponin and low potassium. Admitting diagnosis 1. Severe hypokalemia 2. Accidental fall 3. Elevated troponin 4. Diabetes mellitus 5. Hypertension 6. Disorientation Treatment plan Admit patient to telemetry Cycle troponin Replace potassium Telemetry monitoring Anticoagulate patient with full dose Lovenox Resume Plavix and cardiac medications Cardiac consultation.  All the records are reviewed and case discussed with ED provider. Management plans discussed with the patient, family and they are in agreement.  CODE STATUS:DNR Code Status History    Date Active Date Inactive Code Status Order ID Comments User Context   06/08/2016 10:24 PM 06/15/2016  3:50 PM DNR [id number]  Gwendolyn FillBincy S Varughese, NP Inpatient   06/08/2016  9:28 AM 06/08/2016 10:23 PM Full Code [id number]  Erin FullingKurian  Kasa, MD ED   02/09/2016 12:58 AM 02/10/2016  2:50 PM Full Code [id number]  Oralia Manisavid Willis, MD ED    Questions for Most Recent Historical Code Status (Order [id number])    Question Answer Comment   In the event of cardiac or respiratory ARREST Do not call a "code blue"    In the event of cardiac or respiratory ARREST Do not perform Intubation, CPR, defibrillation or ACLS    In the event of cardiac or respiratory ARREST Use medication by any route, position, wound care, and other measures to relive pain and suffering. May use oxygen, suction and manual treatment of airway obstruction as needed for comfort.        TOTAL TIME TAKING CARE OF THIS PATIENT: 51 minutes.    Ihor AustinPavan Elodie Panameno M.D on 06/21/2016 at 4:00 AM  Between 7am to 6pm - Pager - [phone]  After 6pm go to [email or web address] - password [email or web address] [facility]  [facility]  Office  [phone]  CC: Primary care physician; Mariam DollarPHELPS, ANNE F, MD

## 2016-06-21 NOTE — Progress Notes (Signed)
Patient admitted early this morning through the ED from [facility].  Daughter was not informed of the admission by [facility] nor the ED.  She is very upset about this omission.  In general she is very anxious about her mother would was reported to be alert and oriented and taking care of her self at an assisted living center and on nov 30 was admitted with sepsis and found to have a CVA.s   Patient now oriented to person only.  Daughter reports the her mother has not slept and three days and is very concerned about this as well.  Patient does seem to go to sleep and 20 minutes or so later wakes up having a conversation with someone.

## 2016-06-21 NOTE — Progress Notes (Signed)
Patient admitted this morning because of hypokalemia . Patient had a fall at nursing home. Having multiple episodes of diarrhea. She was here last month for septic shock, UTI.  Vitals: Heart rate is in 120s patient is in atrial fibrillation.  Blood pressure; 113/53. Temperature 98.1, Physical examination patient to is disoriented to time, place, person. According to her daughter patient did not sleep well last 2 days. Cardiovascular system S1-S2 regular patient has paroxysmal atrial fibrillation. Lungs bilaterally clear to auscultation. No wheezing. Neurological examination patient is a is the very sleepy, disoriented, unable to do full neurological exam because of that.  Assessment and plan; history of fall: Patient to the daughter is requesting the different at nursing home.   2.severe hypokalemia likely secondary to GI losses: Patient having diarrhea for at least 3 days. Replace the potassium, check for stool for C. difficile. Patient did have C. difficile before. #3 paroxysmal atrial fibrillation, new diagnosis. Will order a Cardizem, continue Plavix. No anticoagulation because of risk of falls.  #4. Hypothyroidism ;continue Synthroid  History of dementia, depression patient is on Seroquel, Zoloft,;,aricept, requested that we continue these medicines. 5 GERD continue PPIs #6 essential hypertension: Controlled #7 diabetes mellitus type but because patient is sleeping and has not eaten,but continue sliding scale with coverage. 8 mild acute renal failure due to poor by mouth intake, diarrhea: Continue IV fluids for today.  #9 mildly elevated troponins up to 0.29 likely due to fall. But because of fo hypertension ,diabetes, new onset atrial fibrillation would like cardiology to see the patient.   Discussed with patient's daughter, course DO NOT RESUSCITATE. I answered all her questions.  Time spent 30 minutes

## 2016-06-21 NOTE — Progress Notes (Signed)
MEDICATION RELATED CONSULT NOTE - INITIAL   Pharmacy Consult for electrolyte monitoring Indication: hypokalemia  Allergies  Allergen Reactions  . Sulfa Antibiotics Hives and Other (See Comments)    Other Reaction: Allergy  . Alendronate Other (See Comments)    Other Reaction: GI UPSET  . Aspirin Other (See Comments)    Reaction:  Intracranial hemorrhage   . Ciprofloxacin Other (See Comments)    Reaction:  Hallucinations/severe agitation    . Diovan [Valsartan] Other (See Comments)    High potassium Reaction:  High potassium levels   . Fosamax [Alendronate Sodium] Other (See Comments)    Reaction:  GI upset     Patient Measurements: Height: 5\' 4"  (162.6 cm) Weight: 111 lb 3.2 oz (50.4 kg) IBW/kg (Calculated) : 54.7   Vital Signs: Temp: 98.4 F (36.9 C) (12/13 1939) Temp Source: Oral (12/13 1939) BP: 131/76 (12/13 1939) Pulse Rate: 133 (12/13 1939) Intake/Output from previous day: 12/12 0701 - 12/13 0700 In: 18.8 [I.V.:18.8] Out: 0  Intake/Output from this shift: No intake/output data recorded.  Labs:  Recent Labs  06/21/16 0012 06/21/16 0548 06/21/16 1832  WBC 5.5 5.5  --   HGB 10.0* 9.5*  --   HCT 30.4* 28.1*  --   PLT 248 228  --   CREATININE 1.38* 1.18*  --   MG  --   --  1.7  ALBUMIN 3.1*  --   --   PROT 6.2*  --   --   AST 37  --   --   ALT 19  --   --   ALKPHOS 72  --   --   BILITOT 0.4  --   --    Estimated Creatinine Clearance: 26.7 mL/min (by C-G formula based on SCr of 1.18 mg/dL (H)).  Assessment: 82 yo female with diarrhea PTA and K of 2.4 on admission  K 2.4 at 0012- received 30 mEq IV x1 K 3.1 at 0548 - checked during the time KCl bag would have been infusing - another KCl 30 mEq IV x1 given  Goal of Therapy:  K 3.5-5.0  Plan:  K=3.9, mg =1.7 No replacement needed. Recheck electrolytes in the AM  Pharmacy will continue to follow.  Cyenna Rebello D Shonte Beutler 06/21/2016,9:45 PM

## 2016-06-21 NOTE — Progress Notes (Signed)
Patient was transferred from the ER via a stretcher following a fall. On admission patient was unable to provide any information, she kept her eyes shut, but was able to respond to voice and her name. She has an abrasion/skin tear on her forehead from preadmission fall, and some redness on her buttock; foam dressing was applied. Patient was in afib, stable VS and maintained good O2sat on room air.

## 2016-06-21 NOTE — Progress Notes (Signed)
ANTICOAGULATION CONSULT NOTE - Initial Consult  Pharmacy Consult for Enoxaparin Indication: chest pain/ACS  Allergies  Allergen Reactions  . Sulfa Antibiotics Hives and Other (See Comments)    Other Reaction: Allergy  . Alendronate Other (See Comments)    Other Reaction: GI UPSET  . Aspirin Other (See Comments)    Reaction:  Intracranial hemorrhage   . Ciprofloxacin Other (See Comments)    Reaction:  Hallucinations/severe agitation    . Diovan [Valsartan] Other (See Comments)    High potassium Reaction:  High potassium levels   . Fosamax [Alendronate Sodium] Other (See Comments)    Reaction:  GI upset     Patient Measurements: Height: 5\' 4"  (162.6 cm) Weight: 111 lb 3.2 oz (50.4 kg) IBW/kg (Calculated) : 54.7 Heparin Dosing Weight:    Vital Signs: Temp: 98.2 F (36.8 C) (12/13 0525) Temp Source: Oral (12/13 0525) BP: 136/47 (12/13 0525) Pulse Rate: 92 (12/13 0525)  Labs:  Recent Labs  06/21/16 0012  HGB 10.0*  HCT 30.4*  PLT 248  CREATININE 1.38*  TROPONINI 0.22*    Estimated Creatinine Clearance: 22.9 mL/min (by C-G formula based on SCr of 1.38 mg/dL (H)).   Medical History: Past Medical History:  Diagnosis Date  . Depression   . Diabetes mellitus without complication (HCC)   . GERD (gastroesophageal reflux disease)   . Hypertension   . Recurrent UTI   . Stroke [facility](HCC)    TIA    Assessment: Patient admitted post fall noted to have elevated troponins. Pharmacy consulted for Enoxaparin dosing.  Goal of Therapy:  Monitor platelets by anticoagulation protocol: Yes   Plan:  Lovenox 50mg  SQ q24h, dose reduced for CrCl less than 4730ml/min  Clovia CuffLisa Shylin Keizer, PharmD, BCPS 06/21/2016 5:42 AM

## 2016-06-21 NOTE — ED Notes (Signed)
Pt transported to room 250 

## 2016-06-21 NOTE — Progress Notes (Signed)
PHARMACIST - PHYSICIAN ORDER COMMUNICATION  CONCERNING: P&T Medication Policy on Herbal Medications  DESCRIPTION:  This patient's order for:  CoQ-10  has been noted.  This product(s) is classified as an "herbal" or natural product. Due to a lack of definitive safety studies or FDA approval, nonstandard manufacturing practices, plus the potential risk of unknown drug-drug interactions while on inpatient medications, the Pharmacy and Therapeutics Committee does not permit the use of "herbal" or natural products of this type within [facility].   ACTION TAKEN: The pharmacy department is unable to verify this order at this time and your patient has been informed of this safety policy. Please reevaluate patient's clinical condition at discharge and address if the herbal or natural product(s) should be resumed at that time.  Clovia CuffLisa Camille Dragan, PharmD, BCPS 06/21/2016 5:36 AM

## 2016-06-22 ENCOUNTER — Inpatient Hospital Stay: Admit: 2016-06-22 | Payer: Medicare Other

## 2016-06-22 DIAGNOSIS — A0472 Enterocolitis due to Clostridium difficile, not specified as recurrent: Secondary | ICD-10-CM

## 2016-06-22 DIAGNOSIS — R4182 Altered mental status, unspecified: Secondary | ICD-10-CM

## 2016-06-22 DIAGNOSIS — I4891 Unspecified atrial fibrillation: Secondary | ICD-10-CM

## 2016-06-22 DIAGNOSIS — R748 Abnormal levels of other serum enzymes: Secondary | ICD-10-CM

## 2016-06-22 LAB — GLUCOSE, CAPILLARY
GLUCOSE-CAPILLARY: 421 mg/dL — AB (ref 65–99)
GLUCOSE-CAPILLARY: 287 mg/dL — AB (ref 65–99)
GLUCOSE-CAPILLARY: 209 mg/dL — AB (ref 65–99)
Glucose-Capillary: 375 mg/dL — ABNORMAL HIGH (ref 65–99)
Glucose-Capillary: 380 mg/dL — ABNORMAL HIGH (ref 65–99)

## 2016-06-22 LAB — BASIC METABOLIC PANEL
ANION GAP: 10 (ref 5–15)
BUN: 25 mg/dL — ABNORMAL HIGH (ref 6–20)
CHLORIDE: 110 mmol/L (ref 101–111)
CO2: 18 mmol/L — AB (ref 22–32)
Calcium: 8.1 mg/dL — ABNORMAL LOW (ref 8.9–10.3)
Creatinine, Ser: 1.3 mg/dL — ABNORMAL HIGH (ref 0.44–1.00)
GFR calc non Af Amer: 36 mL/min — ABNORMAL LOW (ref >60)
GFR, EST AFRICAN AMERICAN: 42 mL/min — AB (ref >60)
Glucose, Bld: 364 mg/dL — ABNORMAL HIGH (ref 65–99)
POTASSIUM: 4.7 mmol/L (ref 3.5–5.1)
Sodium: 138 mmol/L (ref 135–145)

## 2016-06-22 MED ORDER — CEFTRIAXONE SODIUM-DEXTROSE 1-3.74 GM-% IV SOLR
1.0000 g | INTRAVENOUS | Status: DC
  Administered 2016-06-22 – 2016-06-24 (×3): 1 g via INTRAVENOUS
  Filled 2016-06-22 (×4): qty 50

## 2016-06-22 MED ORDER — INSULIN ASPART 100 UNIT/ML ~~LOC~~ SOLN: 5.0000 [IU] | Freq: Two times a day (BID) | SUBCUTANEOUS | Status: DC

## 2016-06-22 MED ORDER — INSULIN ASPART 100 UNIT/ML ~~LOC~~ SOLN
0.0000 [IU] | Freq: Every day | SUBCUTANEOUS | Status: DC
  Administered 2016-06-22: 2 [IU] via SUBCUTANEOUS
  Filled 2016-06-22: qty 2

## 2016-06-22 MED ORDER — INSULIN ASPART 100 UNIT/ML ~~LOC~~ SOLN
0.0000 [IU] | Freq: Three times a day (TID) | SUBCUTANEOUS | Status: DC
  Administered 2016-06-22: 9 [IU] via SUBCUTANEOUS
  Administered 2016-06-22: 5 [IU] via SUBCUTANEOUS
  Administered 2016-06-23: 2 [IU] via SUBCUTANEOUS
  Administered 2016-06-23: 3 [IU] via SUBCUTANEOUS
  Administered 2016-06-24: 5 [IU] via SUBCUTANEOUS
  Administered 2016-06-24: 3 [IU] via SUBCUTANEOUS
  Administered 2016-06-25: 1 [IU] via SUBCUTANEOUS
  Administered 2016-06-25 (×2): 2 [IU] via SUBCUTANEOUS
  Filled 2016-06-22: qty 2
  Filled 2016-06-22: qty 1
  Filled 2016-06-22: qty 2
  Filled 2016-06-22: qty 5
  Filled 2016-06-22: qty 2
  Filled 2016-06-22: qty 5
  Filled 2016-06-22: qty 2
  Filled 2016-06-22: qty 3
  Filled 2016-06-22: qty 9
  Filled 2016-06-22: qty 2
  Filled 2016-06-22: qty 3

## 2016-06-22 MED ORDER — INSULIN GLARGINE 100 UNIT/ML ~~LOC~~ SOLN
9.0000 [IU] | Freq: Every day | SUBCUTANEOUS | Status: DC
  Administered 2016-06-22 – 2016-06-26 (×5): 9 [IU] via SUBCUTANEOUS
  Filled 2016-06-22 (×6): qty 0.09

## 2016-06-22 MED ORDER — INSULIN ASPART 100 UNIT/ML ~~LOC~~ SOLN
4.0000 [IU] | Freq: Three times a day (TID) | SUBCUTANEOUS | Status: DC
  Administered 2016-06-22 (×2): 4 [IU] via SUBCUTANEOUS
  Filled 2016-06-22 (×2): qty 4

## 2016-06-22 MED ORDER — INSULIN ASPART 100 UNIT/ML ~~LOC~~ SOLN: 4.0000 [IU] | Freq: Every day | SUBCUTANEOUS | Status: DC

## 2016-06-22 MED ORDER — METOPROLOL TARTRATE 25 MG PO TABS
25.0000 mg | ORAL_TABLET | Freq: Four times a day (QID) | ORAL | Status: DC
  Administered 2016-06-22 (×2): 25 mg via ORAL
  Filled 2016-06-22 (×2): qty 1

## 2016-06-22 MED ORDER — DIPHENHYDRAMINE HCL 25 MG PO CAPS
25.0000 mg | ORAL_CAPSULE | Freq: Once | ORAL | Status: AC
  Administered 2016-06-23: 25 mg via ORAL
  Filled 2016-06-22: qty 1

## 2016-06-22 MED ORDER — INSULIN ASPART 100 UNIT/ML ~~LOC~~ SOLN
6.0000 [IU] | Freq: Once | SUBCUTANEOUS | Status: AC
  Administered 2016-06-22: 6 [IU] via SUBCUTANEOUS
  Filled 2016-06-22: qty 6

## 2016-06-22 MED ORDER — INSULIN ASPART 100 UNIT/ML ~~LOC~~ SOLN: 4.0000 [IU] | Freq: Three times a day (TID) | SUBCUTANEOUS | Status: DC

## 2016-06-22 MED ORDER — DIPHENHYDRAMINE HCL 25 MG PO CAPS
25.0000 mg | ORAL_CAPSULE | Freq: Every evening | ORAL | Status: DC | PRN
  Administered 2016-06-22: 25 mg via ORAL
  Filled 2016-06-22: qty 1

## 2016-06-22 NOTE — Progress Notes (Signed)
PT Cancellation Note  Patient Details Name: Julie DikeMary Bethards MRN: [id number] DOB: [birth year]   Cancelled Treatment:    Reason Eval/Treat Not Completed: Patient not medically ready Pt with troponins that are not trending down and recurrent blood glucose levels above 350.  Will hold PT today and see when more medically appropriate.   Malachi ProGalen R Daphnee Preiss, DPT 06/22/2016, 11:21 AM

## 2016-06-22 NOTE — [place] (Signed)
[place] MEDICAID FL2 LEVEL OF CARE SCREENING TOOL     IDENTIFICATION  Patient Name: Julie Burch Birthdate: [birth year] Sex: female Admission Date (Current Location): 06/21/2016  [place]   Facility and Address:  [facility], [address], [place], [place] [address]      Provider Number: [id number]  Attending Physician Name and Address:  Ramonita LabAruna Kaina Orengo, MD  Relative Name and Phone Number:  Roque Casholer,Sandra Daughter [phone] or Toler,[phone]    Current Level of Care: Hospital Recommended Level of Care: Skilled Nursing Facility Prior Approval Number:    Date Approved/Denied:   PASRR Number: [id number]  Discharge Plan: SNF    Current Diagnoses: Patient Active Problem List   Diagnosis Date Noted  . New onset atrial fibrillation (HCC) 06/22/2016  . Altered mental state 06/22/2016  . C. difficile diarrhea 06/22/2016  . Hypokalemia 06/21/2016  . Elevated troponin 06/21/2016  . Pressure injury of skin 06/21/2016  . Septic shock (HCC) 06/08/2016  . Recurrent UTI 02/09/2016  . GERD (gastroesophageal reflux disease) 02/09/2016  . HTN (hypertension) 02/09/2016  . Hypothyroidism 02/09/2016  . Depression 02/09/2016  . Acute on chronic kidney failure (HCC) 02/09/2016  . Diabetes (HCC) 02/09/2016    Orientation RESPIRATION BLADDER Height & Weight     Self  O2 (2 L) Incontinent Weight: 111 lb 3.2 oz (50.4 kg) Height:  5\' 4"  (162.6 cm)  BEHAVIORAL SYMPTOMS/MOOD NEUROLOGICAL BOWEL NUTRITION STATUS      Incontinent  (Dysphagia 1 diet)  AMBULATORY STATUS COMMUNICATION OF NEEDS Skin   Limited Assist Verbally Normal                       Personal Care Assistance Level of Assistance  Bathing, Feeding, Dressing Bathing Assistance: Limited assistance Feeding assistance: Limited assistance Dressing Assistance: Limited assistance     Functional Limitations Info   Sight, Hearing, Speech Sight Info: Adequate Hearing Info: Impaired Speech Info: Impaired    SPECIAL CARE FACTORS FREQUENCY  PT (By licensed PT)     PT Frequency: 5x a week              Contractures Contractures Info: Not present    Additional Factors Info  Code Status, Allergies, Psychotropic, Insulin Sliding Scale Code Status Info: DNR Allergies Info: SULFA ANTIBIOTICS, ALENDRONATE, ASPIRIN, CIPROFLOXACIN, DIOVAN VALSARTAN, FOSAMAX ALENDRONATE SODIUM Psychotropic Info: sertraline (ZOLOFT) tablet 150 mg and QUEtiapine (SEROQUEL) tablet 150 mg Insulin Sliding Scale Info: insulin aspart (novoLOG) injection 0-9 Units 3x a day with meals       Current Medications (06/22/2016):  This is the current hospital active medication list Current Facility-Administered Medications  Medication Dose Route Frequency Provider Last Rate Last Dose  . 0.9 %  sodium chloride infusion  250 mL Intravenous PRN Pavan Pyreddy, MD      . 0.9 %  sodium chloride infusion   Intravenous Continuous Ihor AustinPavan Pyreddy, MD 75 mL/hr at 06/22/16 0950    . acetaminophen (TYLENOL) tablet 650 mg  650 mg Oral Q4H PRN Ihor AustinPavan Pyreddy, MD      . acidophilus (RISAQUAD) capsule 1 capsule  1 capsule Oral BID Ihor AustinPavan Pyreddy, MD   1 capsule at 06/22/16 1235  . atorvastatin (LIPITOR) tablet 40 mg  40 mg Oral q1800 Ihor AustinPavan Pyreddy, MD   40 mg at 06/21/16 1708  . cefTRIAXone (ROCEPHIN) IVPB 1 g  1 g Intravenous Q24H Ramonita LabAruna Lunetta Marina, MD      . cholecalciferol (VITAMIN D) tablet 2,000 Units  2,000 Units Oral Daily Ihor AustinPavan Pyreddy, MD   2,000 Units at 06/22/16 1234  . diltiazem (CARDIZEM) tablet 60 mg  60 mg Oral Q8H Oralia Manisavid Willis, MD   60 mg at 06/22/16 1312  . enoxaparin (LOVENOX) injection 50 mg  50 mg Subcutaneous Q24H Ihor AustinPavan Pyreddy, MD   50 mg at 06/22/16 [id number]  . famotidine (PEPCID) tablet 20 mg  20 mg Oral Q48H Pavan Pyreddy, MD   20 mg at 06/22/16 1236  . gabapentin (NEURONTIN) capsule 100 mg  100 mg Oral TID Ihor AustinPavan Pyreddy, MD   100 mg at  06/22/16 1235  . insulin aspart (novoLOG) injection 0-5 Units  0-5 Units Subcutaneous QHS Jamy Cleckler, MD      . insulin aspart (novoLOG) injection 0-9 Units  0-9 Units Subcutaneous TID WC Ramonita LabAruna Jermy Couper, MD   9 Units at 06/22/16 1248  . insulin aspart (novoLOG) injection 4 Units  4 Units Subcutaneous TID WC Ramonita LabAruna Jenae Tomasello, MD   4 Units at 06/22/16 1249  . insulin glargine (LANTUS) injection 9 Units  9 Units Subcutaneous Daily Ramonita LabAruna Damira Kem, MD   9 Units at 06/22/16 1024  . levothyroxine (SYNTHROID, LEVOTHROID) tablet 50 mcg  50 mcg Oral QAC breakfast Ihor AustinPavan Pyreddy, MD   50 mcg at 06/22/16 0946  . MEDLINE mouth rinse  15 mL Mouth Rinse BID Ihor AustinPavan Pyreddy, MD   15 mL at 06/21/16 2252  . metoprolol tartrate (LOPRESSOR) tablet 25 mg  25 mg Oral Q6H Ryan M Dunn, PA-C   25 mg at 06/22/16 1314  . nitroGLYCERIN (NITROSTAT) SL tablet 0.4 mg  0.4 mg Sublingual Q5 Min x 3 PRN Pavan Pyreddy, MD      . ondansetron (ZOFRAN) injection 4 mg  4 mg Intravenous Q6H PRN Pavan Pyreddy, MD      . pantoprazole (PROTONIX) EC tablet 40 mg  40 mg Oral Daily Pavan Pyreddy, MD   40 mg at 06/22/16 1235  . QUEtiapine (SEROQUEL) tablet 150 mg  150 mg Oral QHS Ihor AustinPavan Pyreddy, MD   150 mg at 06/21/16 2252  . sertraline (ZOLOFT) tablet 150 mg  150 mg Oral Daily Ihor AustinPavan Pyreddy, MD   150 mg at 06/22/16 1235  . sodium chloride flush (NS) 0.9 % injection 3 mL  3 mL Intravenous Q12H Pavan Pyreddy, MD   3 mL at 06/22/16 1250  . sodium chloride flush (NS) 0.9 % injection 3 mL  3 mL Intravenous PRN Pavan Pyreddy, MD      . vitamin B-12 (CYANOCOBALAMIN) tablet 1,000 mcg  1,000 mcg Oral Daily Ihor AustinPavan Pyreddy, MD   1,000 mcg at 06/22/16 1235     Discharge Medications: Please see discharge summary for a list of discharge medications.  Relevant Imaging Results:  Relevant Lab Results:   Additional Information SSN [id number]  Darleene Cleavernterhaus, Eric R, [place]

## 2016-06-22 NOTE — Consult Note (Signed)
Cardiology Consultation Note  Patient ID: Julie DikeMary Burch, MRN: [id number], DOB/AGE: 1927/03/09 82 y.o. Admit date: 06/21/2016   Date of Consult: 06/22/2016 Primary Physician: Mariam DollarPHELPS, ANNE F, MD Primary Cardiologist: [facility] Requesting Physician: Dr. Tobi BastosPyreddy, MD  Chief Complaint: Fall Reason for Consult: New onset Afib/flutter  HPI: 82 y.o. female with h/o dementia, recent CVA in late November felt to possibly be embolic on Plavix, prior intracranial hemorrhage, DM, HTN, recurrent UTI who [facility]/p [facility] at her nursing home.   Patient was [facility] in late November for AMS in the setting of septic shock 2/2 UTI, acute CVA involving the left insular area, diarrhea with C diff positive labs, and ARF 2/2 ATN from sepsis. Seen by neurology and started on Plavix. There was concern her stroke may have been embolic. Echo showed EF 60-65%, no embolic source of CVA noted. Carotid doppler showed no hemodynamically significant stenosis. There was question of possible atrial flutter upon admission, though review of this 12-lead EKG appears to show atrial flutter with RVR with heart rates in the 120's bpm.   Patient was at nursing facility when she suffered a fall while getting out of bed leading to her admission. She was lethargic and with decreased responsiveness in the ED. It has been unclear what to baseline is.   Upon patient's arrival they were found to be markedly hypokalemic at 2.4 s/p repletion to 4.7 at this time. Magnesium 1.7. C diff is pending. Troponin 0.25 peak at this time. WBC 5.5, HGB 10.0, PLT 248, LDL 38, TSH normal. No EKG has been performed this admission to date. CT head showed left frontal scalp contusion without acute intracranial pathology. Hip film negative for fracture. No CXR. Patient is unable to provide history given her dementia. On telemetry she has been Afib/flutter with RVR with heart rates into the 130's bpm. She was started on a Cardizem gtt  overnight and has been transitioned to PO this morning. She remains on amlodipine and hydralazine. Upon reviewing her prior ekgs in late October she was in sinus rhythm with a rate of 60 bpm. Noted to have a tachycardic heart rate in the 130's bpm back in August 2017, no rhythm strips for review. History is mostly taken from prior notes given her dementia.   Past Medical History:  Diagnosis Date  . Depression   . Diabetes mellitus without complication (HCC)   . GERD (gastroesophageal reflux disease)   . Hypertension   . Recurrent UTI   . Stroke [facility](HCC)    TIA      Most Recent Cardiac Studies: Echo 06/12/2016: Study Conclusions  - Left ventricle: The cavity size was normal. Wall thickness was   normal. Systolic function was normal. The estimated ejection   fraction was in the range of 60% to 65%. Wall motion was normal;   there were no regional wall motion abnormalities. - Aortic valve: Valve area (VTI): 1.39 cm^2. Valve area (Vmax):   1.48 cm^2. Valve area (Vmean): 1.3 cm^2. - Left atrium: The atrium was mildly dilated. - Tricuspid valve: There was moderate regurgitation.  Impressions:  - Normal overall left ventricular function   normal wall motion   ejection fraction of greater than 60%   moderate to severe tricuspid regurgitation   bilateral atrial enlargement   Atherosclerosis with no significant stenosis. Normal study. No   cardiac source of emboli was indentified.   Surgical History:  Past Surgical History:  Procedure Laterality Date  . TONSILLECTOMY  Home Meds: Prior to Admission medications   Medication Sig Start Date End Date Taking? Authorizing Provider  acetaminophen (TYLENOL) 500 MG tablet Take 1,000 mg by mouth every 4 (four) hours as needed for mild pain or headache.   Yes Historical Provider, MD  acidophilus (RISAQUAD) CAPS capsule Take 1 capsule by mouth 2 (two) times daily. 06/14/16  Yes Ramonita Lab, MD  amLODipine (NORVASC) 10 MG tablet Take 1  tablet (10 mg total) by mouth daily. 06/15/16  Yes Ramonita Lab, MD  atorvastatin (LIPITOR) 40 MG tablet Take 1 tablet (40 mg total) by mouth daily at 6 PM. 06/14/16  Yes Ramonita Lab, MD  cephALEXin (KEFLEX) 500 MG capsule Take 1 capsule (500 mg total) by mouth 3 (three) times daily. 06/14/16 07/05/16 Yes Ramonita Lab, MD  cholecalciferol (VITAMIN D) 1000 units tablet Take 2,000 Units by mouth daily.   Yes Historical Provider, MD  clopidogrel (PLAVIX) 75 MG tablet Take 1 tablet (75 mg total) by mouth daily. 06/15/16  Yes Ramonita Lab, MD  Coenzyme Q10 (COQ-10) 100 MG CAPS Take 100 mg by mouth daily.   Yes Historical Provider, MD  donepezil (ARICEPT) 5 MG tablet Take 5 mg by mouth at bedtime.   Yes Historical Provider, MD  esomeprazole (NEXIUM) 40 MG capsule Take 40 mg by mouth daily before breakfast.   Yes Historical Provider, MD  famotidine (PEPCID) 20 MG tablet Take 1 tablet (20 mg total) by mouth every other day. 06/16/16  Yes Ramonita Lab, MD  fosfomycin (MONUROL) 3 g PACK Take 3 g by mouth See admin instructions. Pt is to take once every ten days.   Yes Historical Provider, MD  glucose 4 GM chewable tablet Chew 4 tablets by mouth as needed (for blood sugar less than 80).   Yes Historical Provider, MD  hydrALAZINE (APRESOLINE) 50 MG tablet Take 50 mg by mouth 2 (two) times daily.   Yes Historical Provider, MD  insulin glargine (LANTUS) 100 UNIT/ML injection Inject 0.09 mLs (9 Units total) into the skin at bedtime. 06/16/16  Yes Ramonita Lab, MD  insulin lispro (HUMALOG) 100 UNIT/ML injection Inject 0.03-0.05 mLs (3-5 Units total) into the skin 3 (three) times daily with meals. Pt injects 5 units before breakfast at 0900, 5 units before lunch at 1130, and 3 units before dinner at 1700. 06/14/16  Yes Ramonita Lab, MD  levothyroxine (SYNTHROID, LEVOTHROID) 50 MCG tablet Take 50 mcg by mouth daily before breakfast.   Yes Historical Provider, MD  metroNIDAZOLE (FLAGYL) 500 MG tablet Take 1 tablet (500 mg total) by  mouth 3 (three) times daily. 06/14/16  Yes Ramonita Lab, MD  mouth rinse LIQD solution 15 mLs by Mouth Rinse route 2 (two) times daily. 06/14/16  Yes Ramonita Lab, MD  ondansetron (ZOFRAN) 4 MG tablet Take 4 mg by mouth every 8 (eight) hours as needed for nausea or vomiting.   Yes Historical Provider, MD  QUEtiapine (SEROQUEL) 50 MG tablet Take 150 mg by mouth at bedtime.    Yes Historical Provider, MD  sertraline (ZOLOFT) 100 MG tablet Take 150 mg by mouth daily.   Yes Historical Provider, MD  vitamin B-12 (CYANOCOBALAMIN) 1000 MCG tablet Take 1,000 mcg by mouth daily.   Yes Historical Provider, MD    Inpatient Medications:  . acidophilus  1 capsule Oral BID  . amLODipine  10 mg Oral Daily  . atorvastatin  40 mg Oral q1800  . cephALEXin  500 mg Oral TID  . cholecalciferol  2,000 Units Oral Daily  .  clopidogrel  75 mg Oral Daily  . diltiazem  60 mg Oral Q8H  . enoxaparin (LOVENOX) injection  50 mg Subcutaneous Q24H  . famotidine  20 mg Oral Q48H  . gabapentin  100 mg Oral TID  . hydrALAZINE  50 mg Oral BID  . insulin aspart  0-5 Units Subcutaneous QHS  . insulin aspart  0-9 Units Subcutaneous TID WC  . insulin aspart  4 Units Subcutaneous TID WC  . insulin glargine  9 Units Subcutaneous Daily  . levothyroxine  50 mcg Oral QAC breakfast  . mouth rinse  15 mL Mouth Rinse BID  . pantoprazole  40 mg Oral Daily  . QUEtiapine  150 mg Oral QHS  . sertraline  150 mg Oral Daily  . sodium chloride flush  3 mL Intravenous Q12H  . vitamin B-12  1,000 mcg Oral Daily   . sodium chloride 75 mL/hr at 06/22/16 0950    Allergies:  Allergies  Allergen Reactions  . Sulfa Antibiotics Hives and Other (See Comments)    Other Reaction: Allergy  . Alendronate Other (See Comments)    Other Reaction: GI UPSET  . Aspirin Other (See Comments)    Reaction:  Intracranial hemorrhage   . Ciprofloxacin Other (See Comments)    Reaction:  Hallucinations/severe agitation    . Diovan [Valsartan] Other (See  Comments)    High potassium Reaction:  High potassium levels   . Fosamax [Alendronate Sodium] Other (See Comments)    Reaction:  GI upset     Social History   Social History  . Marital status: Widowed    Spouse name: N/A  . Number of children: N/A  . Years of education: N/A   Occupational History  . retired    Social History Main Topics  . Smoking status: Former Games developermoker  . Smokeless tobacco: Never Used  . Alcohol use No  . Drug use: No  . Sexual activity: No   Other Topics Concern  . Not on file   Social History Narrative  . No narrative on file     Family History  Problem Relation Age of Onset  . Family history unknown: Yes     Review of Systems: Review of Systems  Unable to perform ROS: Dementia    Labs:  Recent Labs  06/21/16 0012 06/21/16 0548 06/21/16 1131 06/21/16 1832  TROPONINI 0.22* 0.29* 0.22* 0.25*   Lab Results  Component Value Date   WBC 5.5 06/21/2016   HGB 9.5 (L) 06/21/2016   HCT 28.1 (L) 06/21/2016   MCV 94.5 06/21/2016   PLT 228 06/21/2016    Recent Labs Lab 06/21/16 0012  06/22/16 0546  NA 138  < > 138  K 2.4*  < > 4.7  CL 105  < > 110  CO2 25  < > 18*  BUN 24*  < > 25*  CREATININE 1.38*  < > 1.30*  CALCIUM 8.8*  < > 8.1*  PROT 6.2*  --   --   BILITOT 0.4  --   --   ALKPHOS 72  --   --   ALT 19  --   --   AST 37  --   --   GLUCOSE 269*  < > 364*  < > = values in this interval not displayed. Lab Results  Component Value Date   CHOL 93 06/21/2016   HDL 40 (L) 06/21/2016   LDLCALC 38 06/21/2016   TRIG 73 06/21/2016   No results found for: DDIMER  Radiology/Studies:  Ct Head Wo Contrast  Result Date: 06/21/2016 CLINICAL DATA:  82 y/o  F; status post fall with forehead injury. EXAM: CT HEAD WITHOUT CONTRAST TECHNIQUE: Contiguous axial images were obtained from the base of the skull through the vertex without intravenous contrast. COMPARISON:  CT head dated 04/09/2016. FINDINGS: Brain: No evidence of acute  infarction, hemorrhage, hydrocephalus, extra-axial collection or mass lesion/mass effect. Stable moderate chronic microvascular ischemic changes of subcortical and periventricular white matter and brain parenchymal volume loss. Vascular: No hyperdense vessel. Extensive calcific atherosclerosis of vertebral and internal carotid artery set skullbase. Skull: No displaced calvarial fracture. Small contusion of the left frontal scalp. Sinuses/Orbits: Visualized paranasal sinuses and mastoid air cells are normally aerated. Bilateral intra-ocular lens replacement. Other: None. IMPRESSION: 1. Small left frontal scalp contusion. No displaced calvarial fracture. No acute intracranial abnormality. 2. Stable moderate chronic microvascular ischemic changes and parenchymal volume loss of the brain. Electronically Signed   By: Mitzi Hansen M.D.   On: 06/21/2016 01:48   Ct Head Wo Contrast  Result Date: 06/09/2016 CLINICAL DATA:  Acute encephalopathy.  Sepsis. EXAM: CT HEAD WITHOUT CONTRAST TECHNIQUE: Contiguous axial images were obtained from the base of the skull through the vertex without intravenous contrast. COMPARISON:  07/05/2016 FINDINGS: BRAIN: There is moderate sulcal and ventricular prominence consistent with superficial and central atrophy. No intraparenchymal hemorrhage, mass effect nor midline shift. There are chronic stable periventricular and subcortical areas of white matter hypoattenuation consistent with chronic small vessel ischemic disease. No acute large vascular territory infarcts. No abnormal extra-axial fluid collections. Basal cisterns are patent. VASCULAR: Moderate calcific atherosclerosis of the carotid siphons. SKULL: No skull fracture. No significant scalp soft tissue swelling. SINUSES/ORBITS: The mastoid air-cells are clear. Trace air-fluid levels in both maxillary sinuses are new since prior exam. Mild acute sinusitis is not excluded. Soft tissue densities consistent mucus noted in  the coronaries left-greater-than-right. Mild ethmoid sinus mucosal thickening. Bilateral lens replacement surgical changes.The included ocular globes and orbital contents are non-suspicious. OTHER: None. IMPRESSION: No acute intracranial abnormality. Chronic moderate small vessel ischemic disease of periventricular and subcortical white matter. Cerebral atrophy. Acute maxillary sinusitis. Electronically Signed   By: Tollie Eth M.D.   On: 06/09/2016 15:49   Ct Head Wo Contrast  Result Date: 06/05/2016 CLINICAL DATA:  Unwitnessed fall today, large hematoma with laceration to the right frontal area, hx of dementia. EXAM: CT HEAD WITHOUT CONTRAST CT CERVICAL SPINE WITHOUT CONTRAST TECHNIQUE: Multidetector CT imaging of the head and cervical spine was performed following the standard protocol without intravenous contrast. Multiplanar CT image reconstructions of the cervical spine were also generated. COMPARISON:  05/06/2016 FINDINGS: CT HEAD FINDINGS Brain: There is central and cortical atrophy. Periventricular white matter changes are consistent with small vessel disease. There is no intra or extra-axial fluid collection or mass lesion. The basilar cisterns and ventricles have a normal appearance. There is no CT evidence for acute infarction or hemorrhage. Vascular: There is atherosclerotic calcification of the carotid siphons. Skull: Right frontal scalp edema/hematoma not associated with fracture. Calvarium is intact. Sinuses/Orbits: No acute abnormality. Other: None CT CERVICAL SPINE FINDINGS Alignment: There is 4 mm anterolisthesis of C6 on C7. Significant disc height loss of T1 -T2. There is no acute fracture or traumatic subluxation. Significant pannus formation at C1-2. Skull base and vertebrae: No acute fracture. No primary bone lesion or focal pathologic process. Soft tissues and spinal canal: No prevertebral fluid or swelling. No visible canal hematoma. Disc levels: Significant disc height loss throughout  the mid cervical spine and upper thoracic spine. Upper chest: Negative. Other: There is a 2.8 x 2.0 cm right thyroid nodule which appears solid and slightly heterogeneous. IMPRESSION: 1.  No evidence for acute intracranial abnormality. 2. Large right frontal scalp hematoma/edema not associated with fracture. 3. Stable anterolisthesis C6 on C7. 4.  No evidence for acute cervical spine abnormality. 5. Right thyroid nodule warrants further evaluation with thyroid ultrasound. Electronically Signed   By: Norva Pavlov M.D.   On: 06/05/2016 18:38   Ct Cervical Spine Wo Contrast  Result Date: 06/05/2016 CLINICAL DATA:  Unwitnessed fall today, large hematoma with laceration to the right frontal area, hx of dementia. EXAM: CT HEAD WITHOUT CONTRAST CT CERVICAL SPINE WITHOUT CONTRAST TECHNIQUE: Multidetector CT imaging of the head and cervical spine was performed following the standard protocol without intravenous contrast. Multiplanar CT image reconstructions of the cervical spine were also generated. COMPARISON:  05/06/2016 FINDINGS: CT HEAD FINDINGS Brain: There is central and cortical atrophy. Periventricular white matter changes are consistent with small vessel disease. There is no intra or extra-axial fluid collection or mass lesion. The basilar cisterns and ventricles have a normal appearance. There is no CT evidence for acute infarction or hemorrhage. Vascular: There is atherosclerotic calcification of the carotid siphons. Skull: Right frontal scalp edema/hematoma not associated with fracture. Calvarium is intact. Sinuses/Orbits: No acute abnormality. Other: None CT CERVICAL SPINE FINDINGS Alignment: There is 4 mm anterolisthesis of C6 on C7. Significant disc height loss of T1 -T2. There is no acute fracture or traumatic subluxation. Significant pannus formation at C1-2. Skull base and vertebrae: No acute fracture. No primary bone lesion or focal pathologic process. Soft tissues and spinal canal: No  prevertebral fluid or swelling. No visible canal hematoma. Disc levels: Significant disc height loss throughout the mid cervical spine and upper thoracic spine. Upper chest: Negative. Other: There is a 2.8 x 2.0 cm right thyroid nodule which appears solid and slightly heterogeneous. IMPRESSION: 1.  No evidence for acute intracranial abnormality. 2. Large right frontal scalp hematoma/edema not associated with fracture. 3. Stable anterolisthesis C6 on C7. 4.  No evidence for acute cervical spine abnormality. 5. Right thyroid nodule warrants further evaluation with thyroid ultrasound. Electronically Signed   By: Norva Pavlov M.D.   On: 06/05/2016 18:38   Mr Brain Wo Contrast  Result Date: 06/10/2016 CLINICAL DATA:  Three day history of altered mental status. Fever. Diabetes. EXAM: MRI HEAD WITHOUT CONTRAST TECHNIQUE: Multiplanar, multiecho pulse sequences of the brain and surrounding structures were obtained without intravenous contrast. COMPARISON:  CT head 06/09/2016. FINDINGS: Brain: There is a tiny focus of restricted diffusion in the LEFT insula consistent with acute infarction. This measures only 2 or 3 mm in size. No other definite areas of restriction are seen. Global atrophy.  Hydrocephalus ex vacuo. Widespread areas of chronic cerebral ischemia and chronic intracerebral hemorrhage, all likely sequelae of hypertensive cerebrovascular disease. Extensive T2 and FLAIR hyperintensities throughout the white matter representing chronic microvascular ischemic change. Vascular: The flow voids are maintained in the carotid, basilar, and both vertebral arteries. There are widespread areas of chronic hemorrhage throughout the brain, involving both cerebellar hemispheres in the deep white matter, LEFT external capsule and centrum semiovale as well as the RIGHT midbrain. These likely represent sequelae of bleeds secondary to hypertensive cerebral vascular disease. Skull and upper cervical spine: Pituitary and  cerebellar tonsils are unremarkable. There is marked pannus contributing to narrowing at the craniocervical junction. Slight cord flattening at the C1 level  probably not significant. Sinuses/Orbits: Layering LEFT maxillary sinus fluid suggesting acuity. Layering sphenoid sinus fluid as well. BILATERAL cataract extraction. Other: None IMPRESSION: Tiny focus of restricted diffusion LEFT insula consistent with acute infarction. This measures only 2-3 mm in size. Global atrophy, extensive small vessel disease, and numerous areas of chronic intracranial hemorrhage, likely sequelae of hypertensive cerebrovascular disease. Layering sphenoid and maxillary sinus fluid, suggesting acuity. Marked pannus surrounds the odontoid. Relative narrowing at the craniocervical junction. Electronically Signed   By: Elsie Stain M.D.   On: 06/10/2016 16:02   US Carotid Bilateral  Result Date: 06/11/2016 CLINICAL DATA:  Slurred speech, altered mental status, acute stroke symptoms EXAM: BILATERAL CAROTID DUPLEX ULTRASOUND TECHNIQUE: Wallace Cullens scale imaging, color Doppler and duplex ultrasound were performed of bilateral carotid and vertebral arteries in the neck. COMPARISON:  06/10/2016 brain MRI FINDINGS: Criteria: Quantification of carotid stenosis is based on velocity parameters that correlate the residual internal carotid diameter with NASCET-based stenosis levels, using the diameter of the distal internal carotid lumen as the denominator for stenosis measurement. The following velocity measurements were obtained: RIGHT ICA:  131/22 cm/sec CCA:  73/13 cm/sec SYSTOLIC ICA/CCA RATIO:  1.79 DIASTOLIC ICA/CCA RATIO:  1.71 ECA:  417 cm/sec LEFT ICA:  204/33 cm/sec CCA:  116/24 cm/sec SYSTOLIC ICA/CCA RATIO:  1.76 DIASTOLIC ICA/CCA RATIO:  1.36 ECA:  135 cm/sec RIGHT CAROTID ARTERY: Moderately severe partially calcified right carotid bifurcation atherosclerosis. This significantly narrows the lumen of the right ECA with an elevated ECA origin  velocity as above. Despite this, the right ICA remains patent with minor plaque formation. No hemodynamically significant right ICA stenosis, velocity elevation, or turbulent flow. Degree of right ICA narrowing is less than 50%. RIGHT VERTEBRAL ARTERY:  Antegrade LEFT CAROTID ARTERY: Similar moderate heterogeneous and partially calcified left carotid bifurcation atherosclerosis. Tortuous left ICA evident. No significant luminal narrowing by grayscale imaging. Tortuosity appears to result in an elevated velocity left ICA velocity measuring 204/32 cm/sec. Degree of stenosis still estimated less than 50%. LEFT VERTEBRAL ARTERY:  Antegrade IMPRESSION: Moderate bilateral carotid atherosclerosis and tortuosity, worse on the left. Tortuosity appears result in left ICA velocity elevation. Degree of ICA stenosis estimated less than 50% bilaterally. Patent antegrade vertebral flow bilaterally Electronically Signed   By: Judie Petit.  Shick M.D.   On: 06/11/2016 11:42   Dg Chest Port 1 View  Result Date: 06/09/2016 CLINICAL DATA:  Acute onset of shortness of breath. Initial encounter. EXAM: PORTABLE CHEST 1 VIEW COMPARISON:  Chest radiograph performed 06/08/2016 FINDINGS: The lungs are well-aerated. Mild peribronchial thickening is noted. There is no evidence of focal opacification, pleural effusion or pneumothorax. The cardiomediastinal silhouette is borderline enlarged. No acute osseous abnormalities are seen. IMPRESSION: Mild peribronchial thickening noted.  Borderline cardiomegaly. Electronically Signed   By: Roanna Raider M.D.   On: 06/09/2016 05:19   Dg Chest Portable 1 View  Result Date: 06/08/2016 CLINICAL DATA:  Unresponsive.  Recent fall . EXAM: PORTABLE CHEST 1 VIEW COMPARISON:  05/06/2016 . FINDINGS: Cardiomegaly with normal pulmonary vascularity. Low lung volumes. No pleural effusion or pneumothorax . Calcified pulmonary nodules consistent with granulomas. No acute bony abnormality . IMPRESSION: 1.  Cardiomegaly  with normal pulmonary vascularity. 2. Low lung volumes. Electronically Signed   By: Maisie Fus  Register   On: 06/08/2016 08:16   Dg Hip Unilat With Pelvis 2-3 Views Right  Result Date: 06/21/2016 CLINICAL DATA:  Under witnessed fall EXAM: DG HIP (WITH OR WITHOUT PELVIS) 2-3V RIGHT COMPARISON:  Hip radiograph 05/06/2016 FINDINGS: There is  no evidence of hip fracture or dislocation. There is no evidence of advanced arthropathy or other focal bone abnormality. Extensive vascular calcifications are again seen. IMPRESSION: No fracture or dislocation of the right hip. Electronically Signed   By: Deatra Robinson M.D.   On: 06/21/2016 01:38    EKG: Not done at time of cardiology consult Telemetry: Interpreted by me showed: Afib/flutter with RVR, 130's bpm  Weights: Filed Weights   06/21/16 0013 06/21/16 0525  Weight: 122 lb (55.3 kg) 111 lb 3.2 oz (50.4 kg)     Physical Exam: Blood pressure (!) 112/51, pulse (!) 132, temperature 98.3 F (36.8 C), resp. rate 16, height 5\' 4"  (1.626 m), weight 111 lb 3.2 oz (50.4 kg), SpO2 95 %. Body mass index is 19.09 kg/m. General: Well developed, well nourished, in no acute distress. Head: Normocephalic, contusion left side forehead, sclera non-icteric, no xanthomas, nares are without discharge.  Neck: Negative for carotid bruits. JVD not elevated. Lungs: Clear bilaterally to auscultation without wheezes, rales, or rhonchi. Breathing is unlabored. Heart: Tachycardic, irregularly irregular with S1 S2. No murmurs, rubs, or gallops appreciated. Abdomen: Soft, non-tender, non-distended with normoactive bowel sounds. No hepatomegaly. No rebound/guarding. No obvious abdominal masses. Msk:  Strength and tone appear normal for age. Extremities: No clubbing or cyanosis. No edema. Distal pedal pulses are 2+ and equal bilaterally. Neuro: Alert. No facial asymmetry. No focal deficit. Moves all extremities spontaneously. Psych:  Responds to questions.    Assessment and  Plan:  Principal Problem:   Altered mental state Active Problems:   Hypokalemia   New onset atrial fibrillation (HCC)   Elevated troponin   C. difficile diarrhea   Pressure injury of skin    1. Newly diagnosed Afib/flutter: -Appears she had tachycardic rhythm in August 2017, no strips for review -She was seen to have atrial flutter with RVR in recent admission November 2017 -Not started on full-dose anticoagulation given prior intracranial hemorrhages -Returned to [facility] s/p fall -Continues to be in Afib/flutter with RVR which certainly may be in the setting of her continued diarrhea 2/2 C diff and hypokalemia  -Has been transitioned to PO Cardizem, heart rate remains tachycardic in the 130's bpm -Discontinue amlodipine and hydralazine in an effort to create more BP room for rate control -Add Lopressor 25 mg q 6 hours -Avoid digoxin given her variable renal function and advanced age -Recommend full-dose anticoagulation given her CHADS2VASc of at least 7 (HTN, age x 2, DM, stroke x 2, female) -Will defer starting this to MD following her long term -Stop Plavix given the above  2. AMS/dementia: -Baseline unknown  3. Fall: -She is at a nursing home and theoretically should not be falling  4. Diarrhea/C diff: -Not on ABX at this time -Labs pending -Per IM -Likely leading to #1  5. Hypokalemia: -Repleted  6. Elevated troponin: -Unable to assess for chest pain given her dementia -Echo last admission as above -Given her AMS/dementia she is unlikely to be a good interventional candidate    Signed, Carola Frost [facility] Pager: [phone] 06/22/2016, 10:01 AM   Patient seen and examined. The patient is not quite coherent. Patient is at atrial fibrillation at least 4 weeks. Vital sign evaluation from the chart does a heart rate 120 in August? Cause. No strips were available.  The atrial fibrillation at this point occurs in the context of systemic illness with  both urosepsis and C. difficile infection.  Rates are rapid and partly related to hemodynamic stress. Hydration  and AV nodal agents are appropriate. Would use either IV Cardizem or beta blockers.  With her being in a care facility, with strongly consider using anticoagulation is post antiplatelet therapy for thromboembolic risk reduction.  For now we'll continue diltiazem. Discussions with care team and healthcare power of attorney would be important to make a decision regarding risks benefits of anticoagulation

## 2016-06-22 NOTE — Progress Notes (Signed)
Inpatient Diabetes Program Recommendations  AACE/ADA: New Consensus Statement on Inpatient Glycemic Control (2015)  Target Ranges:  Prepandial:   less than 140 mg/dL      Peak postprandial:   less than 180 mg/dL (1-2 hours)      Critically ill patients:  140 - 180 mg/dL   Results for Julie Burch, Julie Burch (MRN [id number]) as of 06/22/2016 08:44  Ref. Range 06/15/2016 08:27 06/21/2016 10:42 06/21/2016 16:42 06/21/2016 21:06 06/22/2016 08:09  Glucose-Capillary Latest Ref Range: 65 - 99 mg/dL 409324 (H) 811234 (H) 914246 (H) 231 (H) 375 (H)   Review of Glycemic Control  Outpatient Diabetes medications: Lantus 9 units QHS, Humalog 5 units with breakfast, 5 units with lunch, and 3 units with supper Current orders for Inpatient glycemic control: Novolog 0-9 units TID with meals  Inpatient Diabetes Program Recommendations: Insulin - Basal: Fasting glucose 375 mg/dl this morning. Please consider ordering Lantus 9 units daily starting now. Insulin - Meal Coverage: Please consider ordering Novolog 4 units TID with meals for meal coverage if patient eats at least 50% of meals. Insulin-correction: Please consider ordering Novolog bedtime correction scale.  Thanks, Orlando PennerMarie Asaph Serena, RN, MSN, CDE Diabetes Coordinator Inpatient Diabetes Program [phone] (Team Pager from 8am to 5pm)

## 2016-06-22 NOTE — Progress Notes (Signed)
MEDICATION RELATED CONSULT NOTE - INITIAL   Pharmacy Consult for electrolyte monitoring Indication: hypokalemia  Allergies  Allergen Reactions  . Sulfa Antibiotics Hives and Other (See Comments)    Other Reaction: Allergy  . Alendronate Other (See Comments)    Other Reaction: GI UPSET  . Aspirin Other (See Comments)    Reaction:  Intracranial hemorrhage   . Ciprofloxacin Other (See Comments)    Reaction:  Hallucinations/severe agitation    . Diovan [Valsartan] Other (See Comments)    High potassium Reaction:  High potassium levels   . Fosamax [Alendronate Sodium] Other (See Comments)    Reaction:  GI upset     Patient Measurements: Height: 5\' 4"  (162.6 cm) Weight: 111 lb 3.2 oz (50.4 kg) IBW/kg (Calculated) : 54.7   Vital Signs: Temp: 97.8 F (36.6 C) (12/14 1136) Temp Source: Oral (12/14 1136) BP: 126/79 (12/14 1136) Pulse Rate: 137 (12/14 1136) Intake/Output from previous day: 12/13 0701 - 12/14 0700 In: 1725 [I.V.:1725] Out: 0  Intake/Output from this shift: No intake/output data recorded.  Labs:  Recent Labs  06/21/16 0012 06/21/16 0548 06/21/16 1832 06/22/16 0546  WBC 5.5 5.5  --   --   HGB 10.0* 9.5*  --   --   HCT 30.4* 28.1*  --   --   PLT 248 228  --   --   CREATININE 1.38* 1.18*  --  1.30*  MG  --   --  1.7  --   ALBUMIN 3.1*  --   --   --   PROT 6.2*  --   --   --   AST 37  --   --   --   ALT 19  --   --   --   ALKPHOS 72  --   --   --   BILITOT 0.4  --   --   --    Estimated Creatinine Clearance: 24.3 mL/min (by C-G formula based on SCr of 1.3 mg/dL (H)).  Assessment: 82 yo female with diarrhea PTA and K of 2.4 on admission  Goal of Therapy:  K 3.5-5.0  Plan:  K: 4.7 No supplementation needed. Will recheck Mg and K+ in AM.   Pharmacy will continue to follow.  Gardner CandleSheema M Charles Andringa, PharmD, BCPS Clinical Pharmacist 06/22/2016 1:24 PM

## 2016-06-22 NOTE — Progress Notes (Signed)
   No EKG has been ordered this admission. Cardiology consulted for new onset Afib. Will order.

## 2016-06-22 NOTE — Progress Notes (Signed)
Pt's HR sustaining in the 120's, from day shift report apparently the pt had been as high as 150. Nothing had been given & nothing is ordered for this HR. Pt was admitted with new onset afib with a cardiology consult, however the consult was never called today so cardiology never saw the pt to address the HR. Looking back in Dr. Suzanne BoronKonidena's note, it looks like he said he was going to order Cardizem, but never did. So this RN paged the hospitalist. Made Dr. Anne HahnWillis aware of what was going on & he is going to put in order to give 5mg  IV cardizem now & then also start PO cardizem.  Will give this medications and continue to monitor. Shirley FriarAlexis Miller, RN, BSN

## 2016-06-22 NOTE — Progress Notes (Addendum)
[facility] [place] [facility]   PATIENT NAME: Julie DikeMary Burch    MR#:  [id number]  DATE OF BIRTH:  1926/07/24  SUBJECTIVE:  CHIEF COMPLAINT:  Patient is not feeling good today. Her stat Accu-Chek has revealed 420. Patient is feeling weak denies any nausea vomitingDaughter at bedside  REVIEW OF SYSTEMS:  CONSTITUTIONAL: No fever. Reporting weakness EYES: No blurred or double vision.  EARS, NOSE, AND THROAT: No tinnitus or ear pain.  RESPIRATORY: No cough, shortness of breath, wheezing or hemoptysis.  CARDIOVASCULAR: No chest pain, orthopnea, edema.  GASTROINTESTINAL: No nausea, vomiting, diarrhea or abdominal pain.  GENITOURINARY: No dysuria, hematuria.  ENDOCRINE: No polyuria, nocturia,  HEMATOLOGY: No anemia, easy bruising or bleeding SKIN: No rash or lesion. MUSCULOSKELETAL: No joint pain or arthritis.   NEUROLOGIC: No tingling, numbness, weakness.  PSYCHIATRY: No anxiety or depression.   DRUG ALLERGIES:   Allergies  Allergen Reactions  . Sulfa Antibiotics Hives and Other (See Comments)    Other Reaction: Allergy  . Alendronate Other (See Comments)    Other Reaction: GI UPSET  . Aspirin Other (See Comments)    Reaction:  Intracranial hemorrhage   . Ciprofloxacin Other (See Comments)    Reaction:  Hallucinations/severe agitation    . Diovan [Valsartan] Other (See Comments)    High potassium Reaction:  High potassium levels   . Fosamax [Alendronate Sodium] Other (See Comments)    Reaction:  GI upset     VITALS:  Blood pressure (!) 112/51, pulse (!) 132, temperature 98.3 F (36.8 C), resp. rate 16, height 5\' 4"  (1.626 m), weight 50.4 kg (111 lb 3.2 oz), SpO2 95 %.  PHYSICAL EXAMINATION:  GENERAL:  82 y.o.-year-old patient lying in the bed with no acute distress.  EYES: Pupils equal, round, reactive to light and accommodation. No scleral icterus. Extraocular muscles intact.  HEENT: Head atraumatic, normocephalic. Oropharynx and nasopharynx  clear. Bruise on her forehead NECK:  Supple, no jugular venous distention. No thyroid enlargement, no tenderness.  LUNGS: Normal breath sounds bilaterally, no wheezing, rales,rhonchi or crepitation. No use of accessory muscles of respiration.  CARDIOVASCULAR: irregularly irregular. No murmurs, rubs, or gallops.  ABDOMEN: Soft, nontender, nondistended. Bowel sounds present. No organomegaly or mass.  EXTREMITIES: No pedal edema, cyanosis, or clubbing.  NEUROLOGIC: Cranial nerves II through XII are intact. Muscle strength 5/5 in all extremities. Sensation intact. Gait not checked.  PSYCHIATRIC: The patient is alert and oriented x 3.  SKIN: No obvious rash, lesion, or ulcer.    LABORATORY PANEL:   CBC  Recent Labs Lab 06/21/16 0548  WBC 5.5  HGB 9.5*  HCT 28.1*  PLT 228   ------------------------------------------------------------------------------------------------------------------  Chemistries   Recent Labs Lab 06/21/16 0012  06/21/16 1832 06/22/16 0546  NA 138  < >  --  138  K 2.4*  < > 3.9 4.7  CL 105  < >  --  110  CO2 25  < >  --  18*  GLUCOSE 269*  < >  --  364*  BUN 24*  < >  --  25*  CREATININE 1.38*  < >  --  1.30*  CALCIUM 8.8*  < >  --  8.1*  MG  --   --  1.7  --   AST 37  --   --   --   ALT 19  --   --   --   ALKPHOS 72  --   --   --   BILITOT  0.4  --   --   --   < > = values in this interval not displayed. ------------------------------------------------------------------------------------------------------------------  Cardiac Enzymes  Recent Labs Lab 06/21/16 1832  TROPONINI 0.25*   ------------------------------------------------------------------------------------------------------------------  RADIOLOGY:  Ct Head Wo Contrast  Result Date: 06/21/2016 CLINICAL DATA:  82 y/o  F; status post fall with forehead injury. EXAM: CT HEAD WITHOUT CONTRAST TECHNIQUE: Contiguous axial images were obtained from the base of the skull through the vertex  without intravenous contrast. COMPARISON:  CT head dated 04/09/2016. FINDINGS: Brain: No evidence of acute infarction, hemorrhage, hydrocephalus, extra-axial collection or mass lesion/mass effect. Stable moderate chronic microvascular ischemic changes of subcortical and periventricular white matter and brain parenchymal volume loss. Vascular: No hyperdense vessel. Extensive calcific atherosclerosis of vertebral and internal carotid artery set skullbase. Skull: No displaced calvarial fracture. Small contusion of the left frontal scalp. Sinuses/Orbits: Visualized paranasal sinuses and mastoid air cells are normally aerated. Bilateral intra-ocular lens replacement. Other: None. IMPRESSION: 1. Small left frontal scalp contusion. No displaced calvarial fracture. No acute intracranial abnormality. 2. Stable moderate chronic microvascular ischemic changes and parenchymal volume loss of the brain. Electronically Signed   By: Mitzi HansenLance  Furusawa-Stratton M.D.   On: 06/21/2016 01:48   Dg Hip Unilat With Pelvis 2-3 Views Right  Result Date: 06/21/2016 CLINICAL DATA:  Under witnessed fall EXAM: DG HIP (WITH OR WITHOUT PELVIS) 2-3V RIGHT COMPARISON:  Hip radiograph 05/06/2016 FINDINGS: There is no evidence of hip fracture or dislocation. There is no evidence of advanced arthropathy or other focal bone abnormality. Extensive vascular calcifications are again seen. IMPRESSION: No fracture or dislocation of the right hip. Electronically Signed   By: Deatra RobinsonKevin  Herman M.D.   On: 06/21/2016 01:38    EKG:   Orders placed or performed during the hospital encounter of 06/21/16  . EKG 12-Lead  . EKG 12-Lead    ASSESSMENT AND PLAN:   Patient is coming with a fall and daughter is resting a different nursing home  Assessment and plan;    # fall:with the left scalp frontal contusion  Patient to the daughter is requesting the different at nursing home.   #abnormal urinalysis with the white blood cell clumps We will get urine  culture and sensitivity On Keflex  # .severe hypokalemia secondary to GI losses Improved today's potassium is at the 4.7 likely secondary to GI losses:   check  stool for C. difficile. Patient did have C. difficile before.  #paroxysmal atrial fibrillation, new diagnosis.   Cardizem, continue Plavix. No anticoagulation because of risk of falls. Cardiology consult is placed and follow-up with Dr. Lewie LoronGolan  #. Hypothyroidism ;continue Synthroid  #History of dementia, depression patient is on Seroquel, Zoloft,;,aricept, requested that we continue these medicines.  # GERD continue PPIs  # essential hypertension: Controlled  # diabetes mellitus -uncontrolled hemoglobin A1c 8.9 Poor by mouth intake this morning Patient is started on Lantus 19 units subcutaneous  Daily Sliding scale insulin Appreciate diabetic coordinator recommendations  # mild acute renal failure due to poor by mouth intake, diarrhea:  Continue IV fluids Avoid nephrotoxins Check BMP in a.m.  # mildly elevated troponins  likely due to fall/demand ischemiafrom paroxysmal atrial fibrillation troponins not trending0.29-0.22-0.25 . Denies any palpitations EKG ordered Follow-up with cardiology  PT evaluation Follow-up with case management    All the records are reviewed and case discussed with Care Management/Social Workerr. Management plans discussed with the patient, family and they are in agreement.  CODE STATUS: DO NOT RESUSCITATE daughter is  the healthcare power of attorney  TOTAL TIME TAKING CARE OF THIS PATIENT: 36 minutes.   POSSIBLE D/C IN1-2DAYS, DEPENDING ON CLINICAL CONDITION.  Note: This dictation was prepared with Dragon dictation along with smaller phrase technology. Any transcriptional errors that result from this process are unintentional.   Ramonita Lab M.D on 06/22/2016 at 10:14 AM  Between 7am to 6pm - Pager - [phone] After 6pm go to [email or web address] - password [email or web address] [facility]  [place]   Hospitalists  Office  [phone]  CC: Primary care physician; Mariam Dollar, MD

## 2016-06-22 NOTE — Clinical Social Work Note (Signed)
MSW spoke to patient's daughter who was at bedside, patient's daughter was inquiring about other SNF facilities.  MSW was given permission to begin bed search process.  [facility] has [facility] at [facility] since June 15, 2016, [facility] is [facility].  MSW to continue to follow patient's progress throughout discharge planning.  Ervin KnackEric R. Cherrell Maybee, MSW [phone]  Mon-Fri 8a-4:30p 06/22/2016 4:05 PM

## 2016-06-22 NOTE — Evaluation (Signed)
Clinical/Bedside Swallow Evaluation Patient Details  Name: Julie DikeMary Fesler MRN: [id number] Date of Birth: 1927/06/08  Today's Date: 06/22/2016 Time: SLP Start Time (ACUTE ONLY): 1130 SLP Stop Time (ACUTE ONLY): 1230 SLP Time Calculation (min) (ACUTE ONLY): 60 min  Past Medical History:  Past Medical History:  Diagnosis Date  . Depression   . Diabetes mellitus without complication (HCC)   . GERD (gastroesophageal reflux disease)   . Hypertension   . Recurrent UTI   . Stroke [facility](HCC)    TIA   Past Surgical History:  Past Surgical History:  Procedure Laterality Date  . TONSILLECTOMY     HPI:      Assessment / Plan / Recommendation Clinical Impression  Pt appears at min-mod increased risk for aspiration moreso w/ thin liquids as evidenced by overt s/s of aspiration post trials. As per Daughter's description, pt has had some baseline of dysphagia w/ thin liquids but nothing consistent. Daughter endorsed episodes of pt "coughing at home during meals and when drinking liquids" for some time prior to these recent hospitalizations but has denied any Pulmonary deficits from such. But d/t pt's new change in status secondary to recent, small Left infarct, another immediate hospitalization(more sedentary), and baseline Dementia/Cognitive decline(Global atrophy, extensive small vessel disease, and numerous areas of chronic intracranial hemorrhage), pt is at increased risk for dysphagia w/ aspiration and sequelae of such including Pneumonia. Oral phase c/b slower bolus manipulation w/ increased texture; noted overall slower lingual movements during tasks and speech as well. Pt would benefit from a modified dsyphagia diet of Dysphagia 1 w/ Nectar liquids w/ strict aspiration precautions. This was explained to Daughter present; education discussed on aspiration precautions and diet recommendations for the time being to reduce risk for aspiration w/ liquids. Daughter agreed.    Aspiration Risk  Mild  aspiration risk (-Moderate aspiration risk)    Diet Recommendation  Dysphagia level 1 w/ NECTAR liquids; aspiration precautions; feeding support at all meals  Medication Administration: Whole meds with puree    Other  Recommendations Recommended Consults:  (Dietician) Oral Care Recommendations: Oral care BID;Staff/trained caregiver to provide oral care Other Recommendations: Order thickener from pharmacy;Prohibited food (jello, ice cream, thin soups);Remove water pitcher;Have oral suction available   Follow up Recommendations Skilled Nursing facility      Frequency and Duration min 3x week  2 weeks       Prognosis Prognosis for Safe Diet Advancement: Fair Barriers to Reach Goals: Cognitive deficits      Swallow Study   General Date of Onset: 06/21/16 Type of Study: Bedside Swallow Evaluation Previous Swallow Assessment: 06/14/2016 - recent admission.  Diet Prior to this Study: Dysphagia 1 (puree);Thin liquids Temperature Spikes Noted: No Respiratory Status: Nasal cannula (2 liters) History of Recent Intubation: No Behavior/Cognition: Cooperative;Confused;Distractible;Requires cueing Oral Cavity Assessment: Within Functional Limits Oral Care Completed by SLP: Recent completion by staff Oral Cavity - Dentition: Dentures, top Vision:  (n/a) Self-Feeding Abilities: Total assist Patient Positioning: Upright in bed Baseline Vocal Quality: Low vocal intensity Volitional Cough: Cognitively unable to elicit Volitional Swallow: Unable to elicit    Oral/Motor/Sensory Function Overall Oral Motor/Sensory Function: Mild impairment (overall weakness w/ open-mouth posture)   Ice Chips Ice chips: Not tested   Thin Liquid Thin Liquid: Impaired Presentation: Cup;Self Fed (assisted; 3 trials) Oral Phase Impairments: Reduced labial seal;Poor awareness of bolus Oral Phase Functional Implications: Prolonged oral transit Pharyngeal  Phase Impairments: Suspected delayed Swallow;Cough -  Delayed;Throat Clearing - Delayed    Nectar Thick Nectar Thick Liquid: Within  functional limits Presentation: Cup;Self Fed;Spoon (assisted; ~2 ozs total)   Honey Thick Honey Thick Liquid: Not tested   Puree Puree: Within functional limits (grossly) Presentation: Spoon (fed; 4 trials) Other Comments: pt drowsy   Solid   GO   Solid: Not tested         Jerilynn SomKatherine Makala Fetterolf, MS, CCC-SLP [place] 06/22/2016,6:10 PM

## 2016-06-23 DIAGNOSIS — R410 Disorientation, unspecified: Secondary | ICD-10-CM

## 2016-06-23 LAB — CLOSTRIDIUM DIFFICILE BY PCR: CDIFFPCR: POSITIVE — AB

## 2016-06-23 LAB — C DIFFICILE QUICK SCREEN W PCR REFLEX
C DIFFICILE (CDIFF) TOXIN: NEGATIVE
C Diff antigen: POSITIVE — AB

## 2016-06-23 LAB — GLUCOSE, CAPILLARY
GLUCOSE-CAPILLARY: 87 mg/dL (ref 65–99)
GLUCOSE-CAPILLARY: 182 mg/dL — AB (ref 65–99)
GLUCOSE-CAPILLARY: 175 mg/dL — AB (ref 65–99)
Glucose-Capillary: 214 mg/dL — ABNORMAL HIGH (ref 65–99)

## 2016-06-23 LAB — MAGNESIUM: Magnesium: 1.9 mg/dL (ref 1.7–2.4)

## 2016-06-23 LAB — POTASSIUM: Potassium: 4.2 mmol/L (ref 3.5–5.1)

## 2016-06-23 MED ORDER — DIPHENHYDRAMINE HCL 25 MG PO CAPS
25.0000 mg | ORAL_CAPSULE | Freq: Three times a day (TID) | ORAL | Status: DC | PRN
  Administered 2016-06-23 – 2016-06-24 (×2): 25 mg via ORAL
  Filled 2016-06-23 (×2): qty 1

## 2016-06-23 MED ORDER — DILTIAZEM HCL ER COATED BEADS 120 MG PO CP24
120.0000 mg | ORAL_CAPSULE | Freq: Every day | ORAL | Status: DC
  Administered 2016-06-23 – 2016-06-26 (×4): 120 mg via ORAL
  Filled 2016-06-23 (×4): qty 1

## 2016-06-23 MED ORDER — APIXABAN 2.5 MG PO TABS
2.5000 mg | ORAL_TABLET | Freq: Two times a day (BID) | ORAL | Status: DC
  Administered 2016-06-23 – 2016-06-26 (×7): 2.5 mg via ORAL
  Filled 2016-06-23 (×7): qty 1

## 2016-06-23 MED ORDER — INSULIN ASPART 100 UNIT/ML ~~LOC~~ SOLN
6.0000 [IU] | Freq: Three times a day (TID) | SUBCUTANEOUS | Status: DC
  Administered 2016-06-23 – 2016-06-26 (×8): 6 [IU] via SUBCUTANEOUS
  Filled 2016-06-23 (×10): qty 6

## 2016-06-23 MED ORDER — VANCOMYCIN 50 MG/ML ORAL SOLUTION
125.0000 mg | Freq: Four times a day (QID) | ORAL | Status: DC
  Administered 2016-06-23 – 2016-06-26 (×11): 125 mg via ORAL
  Filled 2016-06-23 (×15): qty 2.5

## 2016-06-23 MED ORDER — METOPROLOL TARTRATE 50 MG PO TABS: 50.0000 mg | ORAL_TABLET | Freq: Two times a day (BID) | ORAL | Status: DC

## 2016-06-23 MED ORDER — METOPROLOL TARTRATE 25 MG PO TABS
25.0000 mg | ORAL_TABLET | Freq: Two times a day (BID) | ORAL | Status: DC
  Administered 2016-06-24 – 2016-06-26 (×4): 25 mg via ORAL
  Filled 2016-06-23 (×6): qty 1

## 2016-06-23 MED ORDER — HALOPERIDOL LACTATE 5 MG/ML IJ SOLN: 0.5000 mg | Freq: Four times a day (QID) | INTRAMUSCULAR | Status: DC | PRN

## 2016-06-23 NOTE — Care Management (Signed)
Added long acting cardizem and low dose Eliquis.  Anticipate discharge within the next 24 hours if all remains stable

## 2016-06-23 NOTE — Consult Note (Signed)
[facility] Consult   Reason for Consult:  Consult for 82 year old woman with delirium Referring Physician:  Gouru Patient Identification: Julie Burch MRN:  [id number] Principal Diagnosis: Acute delirium Diagnosis:   Patient Active Problem List   Diagnosis Date Noted  . Acute delirium [R41.0] 06/23/2016  . New onset atrial fibrillation ([place]) [I48.91] 06/22/2016  . Altered mental state [R41.82] 06/22/2016  . C. difficile diarrhea [A04.72] 06/22/2016  . Hypokalemia [E87.6] 06/21/2016  . Elevated troponin [R74.8] 06/21/2016  . Pressure injury of skin [L89.90] 06/21/2016  . Septic shock ([place]) [A41.9, R65.21] 06/08/2016  . Recurrent UTI [N39.0] 02/09/2016  . GERD (gastroesophageal reflux disease) [K21.9] 02/09/2016  . HTN (hypertension) [I10] 02/09/2016  . Hypothyroidism [E03.9] 02/09/2016  . Depression [F32.9] 02/09/2016  . Acute on chronic kidney failure ([place]) [N17.9, N18.9] 02/09/2016  . Diabetes ([place]) [E11.9] 02/09/2016    Total Time spent with patient: 45 minutes  Subjective:   Julie Burch is a 82 y.o. female patient admitted with patient not able to give any information.  HPI:  82 year old woman with out any significant past psychiatric history currently in the hospital with C. difficile and other sources of delirium. It was reported that she was having hallucinations. Patient was not awake or arousable to give any information. Her son-in-law was present. He said that yesterday she was having more episodes of odd behavior of seeming to hug at the air and talking to people who were not there. Prior to coming into the hospital she was at her usual mental state which is demented but able to engage in conversation.  Social history: Patient lives at an assisted living facility at a high level of care.  Medical history: Currently diagnosed with C. difficile. History of diabetes gastric reflux hypertension history of stroke  Substance abuse history: None  Past  Psychiatric History: Patient came into the hospital on Zoloft 150 mg per day and Seroquel 150 mg at night. Family does not know of any past psychiatric history however. Nothing documented about depression. Seem to possibly been started outside the hospital. Family however reports that even with this medicine she was at her baseline mental state. No history of suicide attempts or violence no history of hospitalization  Risk to Self: Is patient at risk for suicide?: No Risk to Others:   Prior Inpatient Therapy:   Prior Outpatient Therapy:    Past Medical History:  Past Medical History:  Diagnosis Date  . Depression   . Diabetes mellitus without complication ([place])   . GERD (gastroesophageal reflux disease)   . Hypertension   . Recurrent UTI   . Stroke [facility])    TIA    Past Surgical History:  Procedure Laterality Date  . TONSILLECTOMY     Family History:  Family History  Problem Relation Age of Onset  . Family history unknown: Yes   Family Psychiatric  History: Nonidentified Social History:  History  Alcohol Use No     History  Drug Use No    Social History   Social History  . Marital status: Widowed    Spouse name: N/A  . Number of children: N/A  . Years of education: N/A   Occupational History  . retired    Social History Main Topics  . Smoking status: Former Research scientist (life sciences)  . Smokeless tobacco: Never Used  . Alcohol use No  . Drug use: No  . Sexual activity: No   Other Topics Concern  . None   Social History Narrative  . None  Additional Social History:    Allergies:   Allergies  Allergen Reactions  . Sulfa Antibiotics Hives and Other (See Comments)    Other Reaction: Allergy  . Alendronate Other (See Comments)    Other Reaction: GI UPSET  . Aspirin Other (See Comments)    Reaction:  Intracranial hemorrhage   . Ciprofloxacin Other (See Comments)    Reaction:  Hallucinations/severe agitation    . Diovan [Valsartan] Other (See Comments)    High  potassium Reaction:  High potassium levels   . Fosamax [Alendronate Sodium] Other (See Comments)    Reaction:  GI upset     Labs:  Results for orders placed or performed during the hospital encounter of 06/21/16 (from the past 48 hour(s))  Glucose, capillary     Status: Abnormal   Collection Time: 06/21/16  9:06 PM  Result Value Ref Range   Glucose-Capillary 231 (H) 65 - 99 mg/dL  Urine culture     Status: None (Preliminary result)   Collection Time: 06/22/16  2:24 AM  Result Value Ref Range   Specimen Description URINE, CATHETERIZED    Special Requests Normal    Culture      TOO YOUNG TO READ Performed at [facility]    Report Status PENDING   Basic metabolic panel     Status: Abnormal   Collection Time: 06/22/16  5:46 AM  Result Value Ref Range   Sodium 138 135 - 145 mmol/L   Potassium 4.7 3.5 - 5.1 mmol/L   Chloride 110 101 - 111 mmol/L   CO2 18 (L) 22 - 32 mmol/L   Glucose, Bld 364 (H) 65 - 99 mg/dL   BUN 25 (H) 6 - 20 mg/dL   Creatinine, Ser 1.30 (H) 0.44 - 1.00 mg/dL   Calcium 8.1 (L) 8.9 - 10.3 mg/dL   GFR calc non Af Amer 36 (L) >60 mL/min   GFR calc Af Amer 42 (L) >60 mL/min    Comment: (NOTE) The eGFR has been calculated using the CKD EPI equation. This calculation has not been validated in all clinical situations. eGFR's persistently <60 mL/min signify possible Chronic Kidney Disease.    Anion gap 10 5 - 15  Glucose, capillary     Status: Abnormal   Collection Time: 06/22/16  8:09 AM  Result Value Ref Range   Glucose-Capillary 375 (H) 65 - 99 mg/dL  Glucose, capillary     Status: Abnormal   Collection Time: 06/22/16 10:03 AM  Result Value Ref Range   Glucose-Capillary 421 (H) 65 - 99 mg/dL  Glucose, capillary     Status: Abnormal   Collection Time: 06/22/16 11:34 AM  Result Value Ref Range   Glucose-Capillary 380 (H) 65 - 99 mg/dL  Glucose, capillary     Status: Abnormal   Collection Time: 06/22/16  5:05 PM  Result Value Ref Range    Glucose-Capillary 287 (H) 65 - 99 mg/dL  Glucose, capillary     Status: Abnormal   Collection Time: 06/22/16  8:37 PM  Result Value Ref Range   Glucose-Capillary 209 (H) 65 - 99 mg/dL  C difficile quick scan w PCR reflex     Status: Abnormal   Collection Time: 06/22/16  9:59 PM  Result Value Ref Range   C Diff antigen POSITIVE (A) NEGATIVE   C Diff toxin NEGATIVE NEGATIVE   C Diff interpretation Results are indeterminate. See PCR results.   Clostridium Difficile by PCR     Status: Abnormal   Collection Time:  06/22/16  9:59 PM  Result Value Ref Range   Toxigenic C Difficile by pcr POSITIVE (A) NEGATIVE    Comment: Positive for toxigenic C. difficile with little to no toxin production. Only treat if clinical presentation suggests symptomatic illness.  Magnesium     Status: None   Collection Time: 06/23/16  4:06 AM  Result Value Ref Range   Magnesium 1.9 1.7 - 2.4 mg/dL  Potassium     Status: None   Collection Time: 06/23/16  4:06 AM  Result Value Ref Range   Potassium 4.2 3.5 - 5.1 mmol/L  Glucose, capillary     Status: None   Collection Time: 06/23/16  8:05 AM  Result Value Ref Range   Glucose-Capillary 87 65 - 99 mg/dL  Glucose, capillary     Status: Abnormal   Collection Time: 06/23/16 11:58 AM  Result Value Ref Range   Glucose-Capillary 182 (H) 65 - 99 mg/dL  Glucose, capillary     Status: Abnormal   Collection Time: 06/23/16  5:00 PM  Result Value Ref Range   Glucose-Capillary 214 (H) 65 - 99 mg/dL    Current Facility-Administered Medications  Medication Dose Route Frequency Provider Last Rate Last Dose  . 0.9 %  sodium chloride infusion  250 mL Intravenous PRN Pavan Pyreddy, MD      . 0.9 %  sodium chloride infusion   Intravenous Continuous Lance Coon, MD 50 mL/hr at 06/23/16 0040    . acetaminophen (TYLENOL) tablet 650 mg  650 mg Oral Q4H PRN Pavan Pyreddy, MD      . acidophilus (RISAQUAD) capsule 1 capsule  1 capsule Oral BID Saundra Shelling, MD   1 capsule at  06/23/16 1037  . apixaban (ELIQUIS) tablet 2.5 mg  2.5 mg Oral BID Wellington Hampshire, MD   2.5 mg at 06/23/16 1037  . atorvastatin (LIPITOR) tablet 40 mg  40 mg Oral q1800 Saundra Shelling, MD   40 mg at 06/23/16 1729  . cefTRIAXone (ROCEPHIN) IVPB 1 g  1 g Intravenous Q24H Nicholes Mango, MD   1 g at 06/23/16 1623  . cholecalciferol (VITAMIN D) tablet 2,000 Units  2,000 Units Oral Daily Saundra Shelling, MD   2,000 Units at 06/23/16 1037  . diltiazem (CARDIZEM CD) 24 hr capsule 120 mg  120 mg Oral Daily Wellington Hampshire, MD   120 mg at 06/23/16 1037  . diphenhydrAMINE (BENADRYL) capsule 25 mg  25 mg Oral Q8H PRN Nicholes Mango, MD      . famotidine (PEPCID) tablet 20 mg  20 mg Oral Q48H Pavan Pyreddy, MD   20 mg at 06/22/16 1236  . gabapentin (NEURONTIN) capsule 100 mg  100 mg Oral TID Saundra Shelling, MD   100 mg at 06/23/16 1624  . haloperidol lactate (HALDOL) injection 0.5 mg  0.5 mg Intravenous Q6H PRN Gonzella Lex, MD      . insulin aspart (novoLOG) injection 0-5 Units  0-5 Units Subcutaneous QHS Nicholes Mango, MD   2 Units at 06/22/16 2127  . insulin aspart (novoLOG) injection 0-9 Units  0-9 Units Subcutaneous TID WC Nicholes Mango, MD   3 Units at 06/23/16 1729  . insulin aspart (novoLOG) injection 6 Units  6 Units Subcutaneous TID WC Nicholes Mango, MD   6 Units at 06/23/16 1729  . insulin glargine (LANTUS) injection 9 Units  9 Units Subcutaneous Daily Nicholes Mango, MD   9 Units at 06/23/16 1048  . levothyroxine (SYNTHROID, LEVOTHROID) tablet 50 mcg  50 mcg Oral  QAC breakfast Saundra Shelling, MD   50 mcg at 06/23/16 1037  . MEDLINE mouth rinse  15 mL Mouth Rinse BID Saundra Shelling, MD   15 mL at 06/23/16 1047  . metoprolol tartrate (LOPRESSOR) tablet 25 mg  25 mg Oral BID Minna Merritts, MD      . nitroGLYCERIN (NITROSTAT) SL tablet 0.4 mg  0.4 mg Sublingual Q5 Min x 3 PRN Pavan Pyreddy, MD      . ondansetron (ZOFRAN) injection 4 mg  4 mg Intravenous Q6H PRN Pavan Pyreddy, MD      . pantoprazole (PROTONIX) EC  tablet 40 mg  40 mg Oral Daily Saundra Shelling, MD   40 mg at 06/23/16 1037  . QUEtiapine (SEROQUEL) tablet 150 mg  150 mg Oral QHS Saundra Shelling, MD   150 mg at 06/22/16 2126  . sertraline (ZOLOFT) tablet 150 mg  150 mg Oral Daily Saundra Shelling, MD   150 mg at 06/23/16 1037  . sodium chloride flush (NS) 0.9 % injection 3 mL  3 mL Intravenous Q12H Pavan Pyreddy, MD   3 mL at 06/23/16 1046  . sodium chloride flush (NS) 0.9 % injection 3 mL  3 mL Intravenous PRN Pavan Pyreddy, MD      . vancomycin (VANCOCIN) 50 mg/mL oral solution 125 mg  125 mg Oral Q6H Aruna Gouru, MD   125 mg at 06/23/16 1729  . vitamin B-12 (CYANOCOBALAMIN) tablet 1,000 mcg  1,000 mcg Oral Daily Saundra Shelling, MD   1,000 mcg at 06/23/16 1037    Musculoskeletal: Strength & Muscle Tone: decreased Gait & Station: unable to stand Patient leans: N/A  Psychiatric Specialty Exam: Physical Exam  Nursing note and vitals reviewed. Constitutional: She appears well-developed. She appears distressed.  HENT:  Head: Normocephalic and atraumatic.  Eyes: Conjunctivae are normal. Pupils are equal, round, and reactive to light.  Neck: Normal range of motion.  Cardiovascular: Regular rhythm and normal heart sounds.   Respiratory: She is in respiratory distress.  GI: Soft.  Skin: Skin is warm.  Psychiatric: Her affect is blunt. Her speech is delayed. She is slowed.    Review of Systems  Unable to perform ROS: Mental acuity    Blood pressure 122/71, pulse 89, temperature 97.7 F (36.5 C), temperature source Oral, resp. rate 18, height _0  (1.626 m), weight 50.4 kg (111 lb 3.2 oz), SpO2 93 %.Body mass index is 19.09 kg/m.  General Appearance: Guarded  Eye Contact:  None  Speech:  Garbled  Volume:  Decreased  Mood:  Dysphoric  Affect:  Negative  Thought Process:  Disorganized  Orientation:  Negative  Thought Content:  Negative  Suicidal Thoughts:  No  Homicidal Thoughts:  No  Memory:  Negative  Judgement:  Negative  Insight:   Negative  Psychomotor Activity:  Negative  Concentration:  Concentration: Poor  Recall:  Negative  Fund of Knowledge:  Negative  Language:  Negative  Akathisia:  Negative  Handed:  Right  AIMS (if indicated):     Assets:  Social Support  ADL's:  Impaired  Cognition:  Impaired,  Severe  Sleep:        Treatment Plan Summary: Medication management and Plan 82 year old woman with delirium area no indication of any other psychiatric illness. Currently calm and not agitated. Seems to tolerate current medicine. I have simply added 0.5 mg Haldol intravenous every 6 hours as needed for agitation. No other intervention at this point. I will follow-up as needed.  Disposition: Patient does not  meet criteria for psychiatric inpatient admission.  Alethia Berthold, MD 06/23/2016 6:40 PM

## 2016-06-23 NOTE — Progress Notes (Signed)
ANTICOAGULATION CONSULT NOTE - Initial Consult  Pharmacy Consult for Enoxaparin Indication: chest pain/ACS  Allergies  Allergen Reactions  . Sulfa Antibiotics Hives and Other (See Comments)    Other Reaction: Allergy  . Alendronate Other (See Comments)    Other Reaction: GI UPSET  . Aspirin Other (See Comments)    Reaction:  Intracranial hemorrhage   . Ciprofloxacin Other (See Comments)    Reaction:  Hallucinations/severe agitation    . Diovan [Valsartan] Other (See Comments)    High potassium Reaction:  High potassium levels   . Fosamax [Alendronate Sodium] Other (See Comments)    Reaction:  GI upset     Patient Measurements: Height: 5\' 4"  (162.6 cm) Weight: 111 lb 3.2 oz (50.4 kg) IBW/kg (Calculated) : 54.7 Heparin Dosing Weight:    Vital Signs: Temp: 97.4 F (36.3 C) (12/15 0528) Temp Source: Oral (12/15 0528) BP: 94/50 (12/15 0528) Pulse Rate: 50 (12/15 0528)  Labs:  Recent Labs  06/21/16 0012 06/21/16 0548 06/21/16 1131 06/21/16 1832 06/22/16 0546  HGB 10.0* 9.5*  --   --   --   HCT 30.4* 28.1*  --   --   --   PLT 248 228  --   --   --   CREATININE 1.38* 1.18*  --   --  1.30*  TROPONINI 0.22* 0.29* 0.22* 0.25*  --     Estimated Creatinine Clearance: 24.3 mL/min (by C-G formula based on SCr of 1.3 mg/dL (H)).   Medical History: Past Medical History:  Diagnosis Date  . Depression   . Diabetes mellitus without complication (H[facility])   . GERD (gastroesophageal reflux disease)   . Hypertension   . Recurrent UTI   . Stroke [facility](HCC)    TIA    Assessment: Patient admitted post fall noted to have elevated troponins. Pharmacy consulted for Enoxaparin dosing.  Goal of Therapy:  Monitor platelets by anticoagulation protocol: Yes   Plan:  Will continue enoxaparin 50mg  SQ q24h, dose reduced for CrCl less than 3830ml/min. Will follow up with plan for anticoagulation at discharge.   Gardner CandleSheema M Kyndell Zeiser, PharmD, BCPS Clinical Pharmacist 06/23/2016 7:26 AM

## 2016-06-23 NOTE — Progress Notes (Signed)
Patient Name: Julie DikeMary Burch Date of Encounter: 06/23/2016  Primary Cardiologist: New (seen by Dr. Graciela Burch)  [facility]     Principal Problem:   Altered mental state Active Problems:   Hypokalemia   Elevated troponin   Pressure injury of skin   New onset atrial fibrillation (HCC)   C. difficile diarrhea     Subjective    The patient has dementia and cannot provide much history. No complaints. The patient had a stroke in November felt  to be embolic and was started on Plavix. Prior history of intracranial hemorrhage. She was brought from nursing home due to a fall, recurrent UTIs and possible C. difficile. She was noted to be in A. fib with RVR. Ventricular rate improved with metoprolol and diltiazem.  Inpatient Medications    Scheduled Meds: . acidophilus  1 capsule Oral BID  . apixaban  2.5 mg Oral BID  . atorvastatin  40 mg Oral q1800  . cefTRIAXone  1 g Intravenous Q24H  . cholecalciferol  2,000 Units Oral Daily  . diltiazem  120 mg Oral Daily  . famotidine  20 mg Oral Q48H  . gabapentin  100 mg Oral TID  . insulin aspart  0-5 Units Subcutaneous QHS  . insulin aspart  0-9 Units Subcutaneous TID WC  . insulin aspart  4 Units Subcutaneous TID WC  . insulin glargine  9 Units Subcutaneous Daily  . levothyroxine  50 mcg Oral QAC breakfast  . mouth rinse  15 mL Mouth Rinse BID  . metoprolol tartrate  50 mg Oral BID  . pantoprazole  40 mg Oral Daily  . QUEtiapine  150 mg Oral QHS  . sertraline  150 mg Oral Daily  . sodium chloride flush  3 mL Intravenous Q12H  . vitamin B-12  1,000 mcg Oral Daily   Continuous Infusions: . sodium chloride 50 mL/hr at 06/23/16 0040   PRN Meds: sodium chloride, acetaminophen, diphenhydrAMINE, nitroGLYCERIN, ondansetron (ZOFRAN) IV, sodium chloride flush   Vital Signs    Vitals:   06/22/16 1737 06/22/16 1900 06/23/16 0528 06/23/16 0901  BP: 96/60  (!) 94/50 117/71  Pulse: 76  (!) 50 72  Resp: (!) 21  17   Temp: 97.9 F  (36.6 C)  97.4 F (36.3 C) 97.6 F (36.4 C)  TempSrc: Oral  Oral Oral  SpO2: 100% 100% 95% 100%  Weight:      Height:        Intake/Output Summary (Last 24 hours) at 06/23/16 1001 Last data filed at 06/23/16 0400  Gross per 24 hour  Intake          1691.67 ml  Output                0 ml  Net          1691.67 ml   Filed Weights   06/21/16 0013 06/21/16 0525  Weight: 122 lb (55.3 kg) 111 lb 3.2 oz (50.4 kg)    Physical Exam    GEN: Well nourished  in no acute distress.  HEENT: Grossly normal.  Neck: Supple, no JVD, carotid bruits, or masses. Cardiac: Irregularly irregular. no murmurs, rubs, or gallops. No clubbing, cyanosis, edema.  Radials/DP/PT 2+ and equal bilaterally.  Respiratory:  Respirations regular and unlabored, clear to auscultation bilaterally. GI: Soft, nontender, nondistended, BS + x 4. MS: no deformity or atrophy. Skin: warm and dry, no rash. Neuro:  Strength and sensation are intact. Psych:  Not oriented.  Labs  CBC  Recent Labs  06/21/16 0012 06/21/16 0548  WBC 5.5 5.5  HGB 10.0* 9.5*  HCT 30.4* 28.1*  MCV 93.3 94.5  PLT 248 228   Basic Metabolic Panel  Recent Labs  06/21/16 0548 06/21/16 1832 06/22/16 0546 06/23/16 0406  NA 141  --  138  --   K 3.1* 3.9 4.7 4.2  CL 110  --  110  --   CO2 22  --  18*  --   GLUCOSE 131*  --  364*  --   BUN 24*  --  25*  --   CREATININE 1.18*  --  1.30*  --   CALCIUM 8.3*  --  8.1*  --   MG  --  1.7  --  1.9   Liver Function Tests  Recent Labs  06/21/16 0012  AST 37  ALT 19  ALKPHOS 72  BILITOT 0.4  PROT 6.2*  ALBUMIN 3.1*   No results for input(s): LIPASE, AMYLASE in the last 72 hours. Cardiac Enzymes  Recent Labs  06/21/16 0548 06/21/16 1131 06/21/16 1832  TROPONINI 0.29* 0.22* 0.25*   BNP Invalid input(s): POCBNP D-Dimer No results for input(s): DDIMER in the last 72 hours. Hemoglobin A1C No results for input(s): HGBA1C in the last 72 hours. Fasting Lipid Panel  Recent  Labs  06/21/16 0548  CHOL 93  HDL 40*  LDLCALC 38  TRIG 73  CHOLHDL 2.3   Thyroid Function Tests No results for input(s): TSH, T4TOTAL, T3FREE, THYROIDAB in the last 72 hours.  Invalid input(s): FREET3  Telemetry    Atrial fibrillation with ventricular rate between 70-90 bpm - Personally Reviewed  ECG     Radiology    No results found.  Cardiac Studies   Echo on December 4 showed normal LV systolic function with mildly dilated left atrium.  Patient Profile     82 year old female with known history of dementia who is a nursing home resident, recent stroke, recurrent UTIs, previous intracranial hemorrhage, recurrent UTIs and C. difficile. She presented after a fall. She was noted to be in atrial fibrillation with rapid ventricular response.   Assessment & Plan    1. Atrial fibrillation: She is likely going to have chronic atrial fibrillation. Recommend rate control which is currently achieved with metoprolol and diltiazem. I switched to diltiazem to long-acting and also twice daily. CHADS2VASc of at least 7 (HTN, age x 2, DM, stroke x 2, female). Thus, we strongly favor long-term anticoagulation in spite of high risk of bleeding. I discussed this extensively with the patient's daughter who is the primary caregiver and we elected to start low dose Eliquis 2.5 mg twice daily. I discontinued Lovenox.  2. Essential hypertension: Amlodipine and hydralazine were both discontinued in order to allow up titration of rate controlling medications.  3. Elevated troponin: Likely due to supply demand ischemia. No plans for ischemic cardiac workup.  Signed, Julie BearsMuhammad Alekhya Gravlin, MD  06/23/2016, 10:01 AM

## 2016-06-23 NOTE — Progress Notes (Signed)
[facility] [place] [facility]   PATIENT NAME: Julie DikeMary Burch    MR#:  [id number]  DATE OF BIRTH:  1927-07-07  SUBJECTIVE:  CHIEF COMPLAINT:  Patient Slept well with Benadryl last night. Julie Burch is requesting to continue Benadryl on as needed basis. Diarrhea better  REVIEW OF SYSTEMS:  CONSTITUTIONAL: No fever. Reporting weakness EYES: No blurred or double vision.  EARS, NOSE, AND THROAT: No tinnitus or ear pain.  RESPIRATORY: No cough, shortness of breath, wheezing or hemoptysis.  CARDIOVASCULAR: No chest pain, orthopnea, edema.  GASTROINTESTINAL: No nausea, vomiting, diarrhea or abdominal pain.  GENITOURINARY: No dysuria, hematuria.  ENDOCRINE: No polyuria, nocturia,  HEMATOLOGY: No anemia, easy bruising or bleeding SKIN: No rash or lesion. MUSCULOSKELETAL: No joint pain or arthritis.   NEUROLOGIC: No tingling, numbness, weakness.  PSYCHIATRY: No anxiety or depression.   DRUG ALLERGIES:   Allergies  Allergen Reactions  . Sulfa Antibiotics Hives and Other (See Comments)    Other Reaction: Allergy  . Alendronate Other (See Comments)    Other Reaction: GI UPSET  . Aspirin Other (See Comments)    Reaction:  Intracranial hemorrhage   . Ciprofloxacin Other (See Comments)    Reaction:  Hallucinations/severe agitation    . Diovan [Valsartan] Other (See Comments)    High potassium Reaction:  High potassium levels   . Fosamax [Alendronate Sodium] Other (See Comments)    Reaction:  GI upset     VITALS:  Blood pressure 122/71, pulse 89, temperature 97.7 F (36.5 C), temperature source Oral, resp. rate 18, height 5\' 4"  (1.626 m), weight 50.4 kg (111 lb 3.2 oz), SpO2 93 %.  PHYSICAL EXAMINATION:  GENERAL:  82 y.o.-year-old patient lying in the bed with no acute distress.  EYES: Pupils equal, round, reactive to light and accommodation. No scleral icterus. Extraocular muscles intact.  HEENT: Head atraumatic, normocephalic. Oropharynx and nasopharynx  clear. Bruise on her forehead NECK:  Supple, no jugular venous distention. No thyroid enlargement, no tenderness.  LUNGS: Normal breath sounds bilaterally, no wheezing, rales,rhonchi or crepitation. No use of accessory muscles of respiration.  CARDIOVASCULAR: irregularly irregular. No murmurs, rubs, or gallops.  ABDOMEN: Soft, nontender, nondistended. Bowel sounds present. No organomegaly or mass.  EXTREMITIES: No pedal edema, cyanosis, or clubbing.  NEUROLOGIC: Cranial nerves II through XII are intact. Muscle strength 5/5 in all extremities. Sensation intact. Gait not checked.  PSYCHIATRIC: The patient is alert and oriented x 3.  SKIN: No obvious rash, lesion, or ulcer.    LABORATORY PANEL:   CBC  Recent Labs Lab 06/21/16 0548  WBC 5.5  HGB 9.5*  HCT 28.1*  PLT 228   ------------------------------------------------------------------------------------------------------------------  Chemistries   Recent Labs Lab 06/21/16 0012  06/22/16 0546 06/23/16 0406  NA 138  < > 138  --   K 2.4*  < > 4.7 4.2  CL 105  < > 110  --   CO2 25  < > 18*  --   GLUCOSE 269*  < > 364*  --   BUN 24*  < > 25*  --   CREATININE 1.38*  < > 1.30*  --   CALCIUM 8.8*  < > 8.1*  --   MG  --   < >  --  1.9  AST 37  --   --   --   ALT 19  --   --   --   ALKPHOS 72  --   --   --   BILITOT 0.4  --   --   --   < > =  values in this interval not displayed. ------------------------------------------------------------------------------------------------------------------  Cardiac Enzymes  Recent Labs Lab 06/21/16 1832  TROPONINI 0.25*   ------------------------------------------------------------------------------------------------------------------  RADIOLOGY:  No results found.  EKG:   Orders placed or performed during the hospital encounter of 06/21/16  . EKG 12-Lead  . EKG 12-Lead    ASSESSMENT AND PLAN:   Patient is coming with a fall and Julie Burch is resting a different nursing  home  Assessment and plan;    # fall:with the left scalp frontal contusion  Patient to the Julie Burch is requesting the different at nursing home.   #abnormal urinalysis with the white blood cell clumps Pending urine culture and sensitivity Patient was on Keflex for 14 days prior to the admission. Started her on IV Rocephin  # .severe hypokalemia secondary to GI losses Improved today's potassium is at the 4.7 likely secondary to GI losses:    #Clostridium difficult colitis Patient is started on by mouth vancomycin  #paroxysmal atrial fibrillation, new diagnosis.   Cardizem CD 120 mg by mouth once daily, metoprolol 50 mg by mouth twice a day,  Cardiology has recommended  eliquis 2.5 mg by mouth twice a day for anticoagulation Appreciate cardiology recommendations  Discontinue Plavix  #. Hypothyroidism ;continue Synthroid  #History of dementia, depression patient is on Seroquel, Zoloft,;,aricept, requested that we continue these medicines.  # GERD continue PPIs  # essential hypertension: Controlled  # diabetes mellitus -uncontrolled hemoglobin A1c 8.9 Poor by mouth intake this morning Patient is started on Lantus 9 units subcutaneous  Daily Sliding scale insulin. NovoLog 6 units 3 times a day with each meal Appreciate diabetic coordinator recommendations  # mild acute renal failure due to poor by mouth intake, diarrhea:  Continue IV fluids Avoid nephrotoxins Check BMP in a.m.  # mildly elevated troponins  likely due to fall/demand ischemiafrom paroxysmal atrial fibrillation troponins not trending0.29-0.22-0.25 . Denies any palpitations EKG ordered Follow-up with cardiology  PT evaluation pending Follow-up with case management    All the records are reviewed and case discussed with Care Management/Social Workerr. Management plans discussed with the patient, family and they are in agreement.  CODE STATUS: DO NOT RESUSCITATE Julie Burch is the healthcare power of  attorney  TOTAL TIME TAKING CARE OF THIS PATIENT: 36 minutes.   POSSIBLE D/C IN1-2DAYS, DEPENDING ON CLINICAL CONDITION.  Note: This dictation was prepared with Dragon dictation along with smaller phrase technology. Any transcriptional errors that result from this process are unintentional.   Ramonita LabGouru, Montford Barg M.D on 06/23/2016 at 4:41 PM  Between 7am to 6pm - Pager - [phone] After 6pm go to [email or web address] - password [email or web address] [facility]  [facility]  Office  [phone]  CC: Primary care physician; Mariam DollarPHELPS, ANNE F, MD

## 2016-06-23 NOTE — Care Management Important Message (Signed)
Important Message  Patient Details  Name: Julie DikeMary Burch MRN: [id number] Date of Birth: 02-20-1927   Medicare Important Message Given:  Yes    Eber HongGreene, Carlise Stofer R, RN 06/23/2016, 1:40 PM

## 2016-06-23 NOTE — Clinical Social Work Note (Signed)
Clinical Social Work Assessment  Patient Details  Name: Julie Burch MRN: [id number] Date of Birth: [birth year]  Date of referral:  06/22/16               Reason for consult:  Facility Placement                Permission sought to share information with:  Family Supports, Magazine features editoracility Contact Representative Permission granted to share information::  Yes, Verbal Permission Granted  Name::     Roque Casholer,Sandra Daughter [phone]   Agency::  SNF Admissions  Relationship::     Contact Information:     Housing/Transportation Living arrangements for the past 2 months:  Skilled Nursing Facility, Assisted Living Facility Source of Information:  Adult Children Patient Interpreter Needed:  None Criminal Activity/Legal Involvement Pertinent to Current Situation/Hospitalization:  No - Comment as needed Significant Relationships:  Adult Children Lives with:  Facility Resident Do you feel safe going back to the place where you live?  No Need for family participation in patient care:  Yes (Comment)  Care giving concerns:  Patient's family would like patient to go to a different SNF.   Social Worker assessment / plan:  Patient is an 82 year old female who was at [facility], patient's family would like her to go to [facility] in [place] due to the location being closer to where they live.  [facility], family have not decided if they plan to have her discharge back to ALF after rehab.  Patient's family are aware of how insurance will pay for stay and what to expect at SNF.  Patient's family requested that MSW look for different SNF.  MSW was given permission to speak to SNFs.  Patient's family did not have any other questions or concerns.  Employment status:  Retired Health and safety inspectornsurance information:  Medicare PT Recommendations:  Skilled Nursing Facility Information / Referral to community resources:  Skilled Nursing  Facility  Patient/Family's Response to care:  Patient's family would like patient to go to different SNF.  Patient/Family's Understanding of and Emotional Response to Diagnosis, Current Treatment, and Prognosis:  Patient and her family are aware of current treatment plan and prognosis.  Emotional Assessment Appearance:  Appears stated age Attitude/Demeanor/Rapport:    Affect (typically observed):  Appropriate, Stable, Restless Orientation:  Oriented to Self Alcohol / Substance use:  Not Applicable Psych involvement (Current and /or in the community):  No (Comment)  Discharge Needs  Concerns to be addressed:  Lack of Support Readmission within the last 30 days:  Yes (06/15/16 to [facility]) Current discharge risk:  None Barriers to Discharge:  Continued Medical Work up   [place] Const[place]enter[place], Kierre Hintz R, LCSWA 06/23/2016, 4:13 PM

## 2016-06-23 NOTE — Plan of Care (Signed)
Problem: SLP Dysphagia Goals Goal: Misc Dysphagia Goal Pt will safely tolerate po diet of least restrictive consistency w/ no overt s/s of aspiration noted by Staff/pt/family x3 sessions.    

## 2016-06-23 NOTE — Progress Notes (Signed)
Contacted by nursing that patient was having pauses. 2 sec pauses seen, rate 60 bpm Recommended we decrease metoprolol down to 25 mg BID with hold parameters  Signed, Dossie Arbourim Lamisha Roussell, MD, Ph.D [facility]

## 2016-06-23 NOTE — Evaluation (Signed)
Physical Therapy Evaluation Patient Details Name: Julie DikeMary Burch MRN: [id number] DOB: 02/24/1927 Today's Date: 06/23/2016   History of Present Illness  82 y/o female here after fall at rehab facility, admitted with AMS.  She had a CVA ~2 weeks ago.  Clinical Impression  Pt is lethargic t/o session, but was able to show relatively good participation with ~15 minutes of exercises apart from exam.  She needed slow, deliberate verbal and demonstration tactile cuing, but did do exercises (generally against light resistance) inconsistently, but with better than expected performance.  Family reports that pt has not been able to ambulate in years and that all transfers are highly assisted.  Pt pleasantly able to work with PT, but struggles with 2-way communication.  Pt with surprising strength in LEs when she was able to understand and do exercises.    Follow Up Recommendations SNF    Equipment Recommendations       Recommendations for Other Services       Precautions / Restrictions Restrictions Weight Bearing Restrictions: No      Mobility  Bed Mobility               General bed mobility comments: testing deferred, pt lethargic and inconsistent with ability to follow instructions.    Transfers                    Ambulation/Gait                Stairs            Wheelchair Mobility    Modified Rankin (Stroke Patients Only)       Balance                                             Pertinent Vitals/Pain Pain Assessment: No/denies pain    Home Living Family/patient expects to be discharged to:: Skilled nursing facility               Home Equipment: Wheelchair - manual Additional Comments: Pt has had many, many falls over the last few years    Prior Function Level of Independence: Needs assistance         Comments: At baseline patient requires assistance for transfers to w/c, she is able to use her LEs to scoot the w/c  around the hallways. She is typically able to feed herself, requries assistance for OOB transfers. At baseline patient requires assistance for transfers to w/c, she is able to use her LEs to scoot the w/c around the hallways. She is typically able to feed herself, requries assistance for OOB transfers.      Hand Dominance        Extremity/Trunk Assessment   Upper Extremity Assessment Upper Extremity Assessment: Difficult to assess due to impaired cognition    Lower Extremity Assessment Lower Extremity Assessment: Generalized weakness;Difficult to assess due to impaired cognition RLE Deficits / Details:  (Pt appeared to have nearly = strength bilaterally, weak t/o)       Communication   Communication: Receptive difficulties;Expressive difficulties;HOH  Cognition Arousal/Alertness: Lethargic Behavior During Therapy:  (difficult to assess, lethargic and moderately confused) Overall Cognitive Status: History of cognitive impairments - at baseline                      General Comments      Exercises General  Exercises - Lower Extremity Ankle Circles/Pumps: AROM;Strengthening;10 reps;Both Quad Sets: Strengthening;10 reps;Both Heel Slides: Strengthening;10 reps;Both Hip ABduction/ADduction: Strengthening;10 reps;Both   Assessment/Plan    PT Assessment Patient needs continued PT services  PT Problem List Decreased strength;Decreased cognition;Decreased balance;Decreased activity tolerance;Decreased coordination          PT Treatment Interventions DME instruction;Functional mobility training;Therapeutic activities;Therapeutic exercise;Neuromuscular re-education;Wheelchair mobility training    PT Goals (Current goals can be found in the Care Plan section)  Acute Rehab PT Goals Patient Stated Goal: To improve her functional mobility.  PT Goal Formulation: With family Time For Goal Achievement: 07/07/16 Potential to Achieve Goals: Fair    Frequency Min 2X/week    Barriers to discharge        Co-evaluation               End of Session   Activity Tolerance: Patient limited by lethargy Patient left: in bed;with call bell/phone within reach;with bed alarm set;with family/visitor present           Time: 1542-1610 PT Time Calculation (min) (ACUTE ONLY): 28 min   Charges:   PT Evaluation $PT Eval Low Complexity: 1 Procedure PT Treatments $Therapeutic Exercise: 8-22 mins   PT G Codes:        Malachi ProGalen R Tykee Heideman, DPT 06/23/2016, 4:52 PM

## 2016-06-23 NOTE — Clinical Social Work Note (Signed)
MSW spoke to [facility] [phone] admissions worker, she said they can accept patient but not until Monday due to not being able to order meds on the weekend.  MSW to continue to follow patient's progress throughout discharge planning.  Ervin KnackEric R. Larkin Morelos, MSW [phone]  Mon-Fri 8a-4:30p 06/23/2016 4:05 PM

## 2016-06-23 NOTE — Progress Notes (Signed)
PT Cancellation Note  Patient Details Name: Lynnell DikeMary Boody MRN: [id number] DOB: [birth year]   Cancelled Treatment:    Reason Eval/Treat Not Completed: Fatigue/lethargy limiting ability to participate Spoke with nursing who reports that pt is sleeping for the first time in days and can't work with PT this AM, will try this afternoon if appropriate and as time allows.  Zaveon Gillen R Heru Montz 06/23/2016, 11:00 AM

## 2016-06-23 NOTE — Progress Notes (Addendum)
Inpatient Diabetes Program Recommendations  AACE/ADA: New Consensus Statement on Inpatient Glycemic Control (2015)  Target Ranges:  Prepandial:   less than 140 mg/dL      Peak postprandial:   less than 180 mg/dL (1-2 hours)      Critically ill patients:  140 - 180 mg/dL  Results for [name], Yanna (MRN [id number]) as of 06/23/2016 09:27  Ref. Range 06/22/2016 08:09 06/22/2016 10:03 06/22/2016 11:34 06/22/2016 17:05 06/22/2016 20:37 06/23/2016 08:05  Glucose-Capillary Latest Ref Range: 65 - 99 mg/dL 409375 (H) 811421 (H) 914380 (H) 287 (H) 209 (H) 87    Review of Glycemic Control  Outpatient Diabetes medications: Lantus 9 units QHS, Humalog 5 units with breakfast, 5 units with lunch, and 3 units with supper Current orders for Inpatient glycemic control: Lantus 9 units daily, Novolog 0-9 units TID with meals, Novolog 0-5 units QHS, Novolog 4 units TID with meals   Inpatient Diabetes Program Recommendations: Insulin - Meal Coverage: Please consider increasing meal coverage to Novolog 6 units TID with meals.  Thanks, Orlando PennerMarie Roise Emert, RN, MSN, CDE Diabetes Coordinator Inpatient Diabetes Program [phone] (Team Pager from 8am to 5pm)

## 2016-06-23 NOTE — Progress Notes (Signed)
Patient was noted with 2sec pause, Dr. Mariah MillingGollan cardiologist made aware, see new order. No distress noted. Patient asymptomatic at this time, will continue to monitor.

## 2016-06-23 NOTE — Progress Notes (Signed)
MEDICATION RELATED CONSULT NOTE - INITIAL   Pharmacy Consult for electrolyte monitoring Indication: hypokalemia  Allergies  Allergen Reactions  . Sulfa Antibiotics Hives and Other (See Comments)    Other Reaction: Allergy  . Alendronate Other (See Comments)    Other Reaction: GI UPSET  . Aspirin Other (See Comments)    Reaction:  Intracranial hemorrhage   . Ciprofloxacin Other (See Comments)    Reaction:  Hallucinations/severe agitation    . Diovan [Valsartan] Other (See Comments)    High potassium Reaction:  High potassium levels   . Fosamax [Alendronate Sodium] Other (See Comments)    Reaction:  GI upset     Patient Measurements: Height: 5\' 4"  (162.6 cm) Weight: 111 lb 3.2 oz (50.4 kg) IBW/kg (Calculated) : 54.7   Vital Signs: Temp: 97.4 F (36.3 C) (12/15 0528) Temp Source: Oral (12/15 0528) BP: 94/50 (12/15 0528) Pulse Rate: 50 (12/15 0528) Intake/Output from previous day: 12/14 0701 - 12/15 0700 In: 1691.7 [I.V.:1641.7; IV Piggyback:50] Out: 0  Intake/Output from this shift: No intake/output data recorded.  Labs:  Recent Labs  06/21/16 0012 06/21/16 0548 06/21/16 1832 06/22/16 0546 06/23/16 0406  WBC 5.5 5.5  --   --   --   HGB 10.0* 9.5*  --   --   --   HCT 30.4* 28.1*  --   --   --   PLT 248 228  --   --   --   CREATININE 1.38* 1.18*  --  1.30*  --   MG  --   --  1.7  --  1.9  ALBUMIN 3.1*  --   --   --   --   PROT 6.2*  --   --   --   --   AST 37  --   --   --   --   ALT 19  --   --   --   --   ALKPHOS 72  --   --   --   --   BILITOT 0.4  --   --   --   --    Estimated Creatinine Clearance: 24.3 mL/min (by C-G formula based on SCr of 1.3 mg/dL (H)).  Assessment: 82 yo female with diarrhea PTA and K of 2.4 on admission  Goal of Therapy:  K 3.5-5.0  Plan:  K: 4.2, Mag: 1.9  No supplementation needed. Will recheck electrolytes in 12/17.   Pharmacy will continue to follow.  Gardner CandleSheema M Alekai Pocock, PharmD, BCPS Clinical  Pharmacist 06/23/2016 7:21 AM

## 2016-06-23 NOTE — Progress Notes (Signed)
SLP Cancellation Note  Patient Details Name: Julie Burch MRN: [id number] DOB: [birth year]   Cancelled treatment:       Reason Eval/Treat Not Completed: Fatigue/lethargy limiting ability to participate (reviewed chart notes; consulted NSG/Dietician/PT). Will f/u w/ pt next 1-3 days for toleration of diet consistency. NSG has reported that pt is eating/drinking well w/ the dysphagia diet ordered. Pt is at increased risk for aspiration d/t medical and Cognitive status' at this time. Aspiration precautions posted.   Jerilynn SomKatherine Bobetta Korf, MS, CCC-SLP Cristal Qadir 06/23/2016, 3:00 PM

## 2016-06-24 ENCOUNTER — Inpatient Hospital Stay: Payer: Medicare Other

## 2016-06-24 LAB — CBC
HCT: 29.9 % — ABNORMAL LOW (ref 35.0–47.0)
HEMOGLOBIN: 9.8 g/dL — AB (ref 12.0–16.0)
MCH: 31.3 pg (ref 26.0–34.0)
MCHC: 32.7 g/dL (ref 32.0–36.0)
MCV: 95.7 fL (ref 80.0–100.0)
Platelets: 244 10*3/uL (ref 150–440)
RBC: 3.12 MIL/uL — AB (ref 3.80–5.20)
RDW: 17.4 % — ABNORMAL HIGH (ref 11.5–14.5)
WBC: 6.3 10*3/uL (ref 3.6–11.0)

## 2016-06-24 LAB — BASIC METABOLIC PANEL
Anion gap: 9 (ref 5–15)
BUN: 40 mg/dL — ABNORMAL HIGH (ref 6–20)
CHLORIDE: 118 mmol/L — AB (ref 101–111)
CO2: 15 mmol/L — ABNORMAL LOW (ref 22–32)
Calcium: 8.1 mg/dL — ABNORMAL LOW (ref 8.9–10.3)
Creatinine, Ser: 2.55 mg/dL — ABNORMAL HIGH (ref 0.44–1.00)
GFR calc non Af Amer: 16 mL/min — ABNORMAL LOW (ref >60)
GFR, EST AFRICAN AMERICAN: 18 mL/min — AB (ref >60)
Glucose, Bld: 175 mg/dL — ABNORMAL HIGH (ref 65–99)
POTASSIUM: 4.2 mmol/L (ref 3.5–5.1)
SODIUM: 142 mmol/L (ref 135–145)

## 2016-06-24 LAB — GLUCOSE, CAPILLARY
GLUCOSE-CAPILLARY: 161 mg/dL — AB (ref 65–99)
Glucose-Capillary: 224 mg/dL — ABNORMAL HIGH (ref 65–99)
Glucose-Capillary: 254 mg/dL — ABNORMAL HIGH (ref 65–99)
Glucose-Capillary: 198 mg/dL — ABNORMAL HIGH (ref 65–99)

## 2016-06-24 LAB — URINE CULTURE
Culture: >=100000 — AB
Special Requests: NORMAL

## 2016-06-24 MED ORDER — SODIUM CHLORIDE 0.9 % IV SOLN
INTRAVENOUS | Status: AC
  Administered 2016-06-24: 13:00:00 via INTRAVENOUS

## 2016-06-24 NOTE — Progress Notes (Signed)
Expressed concern  for continuing Apixaban in this patient with increasing serum creatinine to hospitalist and cardiologist. No new orders given. MD would like to  follow renal function closely.   Demetrius Charityeldrin D. Chizara Mena, PharmD

## 2016-06-24 NOTE — Progress Notes (Signed)
Patient was seen by attending, new order for stroke work up. Morning medicine was given late due to the work up. No complain at this time, patient resting in bed family at bedside. Foley inserted as ordered, draining well. Neuro checks as ordered. Will continue to monitor patient.

## 2016-06-24 NOTE — Progress Notes (Signed)
[facility] [place] [facility]   PATIENT NAME: Julie Burch    MR#:  [id number]  DATE OF BIRTH:  17-Jul-1926  SUBJECTIVE:  CHIEF COMPLAINT:  Patient's mouth is deviated to right and not moving rue much REVIEW OF SYSTEMS:  CONSTITUTIONAL: No fever. Reporting weakness EYES: No blurred or double vision.  EARS, NOSE, AND THROAT: No tinnitus or ear pain.  RESPIRATORY: No cough, shortness of breath, wheezing or hemoptysis.  CARDIOVASCULAR: No chest pain, orthopnea, edema.  GASTROINTESTINAL: No nausea, vomiting, diarrhea or abdominal pain.  GENITOURINARY: No dysuria, hematuria.  ENDOCRINE: No polyuria, nocturia,  HEMATOLOGY: No anemia, easy bruising or bleeding SKIN: No rash or lesion. MUSCULOSKELETAL: No joint pain or arthritis.   NEUROLOGIC: angle of moth to the right.No tingling, numbness, weakness.  PSYCHIATRY: No anxiety or depression.   DRUG ALLERGIES:   Allergies  Allergen Reactions  . Sulfa Antibiotics Hives and Other (See Comments)    Other Reaction: Allergy  . Alendronate Other (See Comments)    Other Reaction: GI UPSET  . Aspirin Other (See Comments)    Reaction:  Intracranial hemorrhage   . Ciprofloxacin Other (See Comments)    Reaction:  Hallucinations/severe agitation    . Diovan [Valsartan] Other (See Comments)    High potassium Reaction:  High potassium levels   . Fosamax [Alendronate Sodium] Other (See Comments)    Reaction:  GI upset     VITALS:  Blood pressure (!) 112/56, pulse 99, temperature 97.7 F (36.5 C), temperature source Oral, resp. rate 20, height 5\' 4"  (1.626 m), weight 50.4 kg (111 lb 3.2 oz), SpO2 98 %.  PHYSICAL EXAMINATION:  GENERAL:  82 y.o.-year-old patient lying in the bed with no acute distress.  EYES: Pupils equal, round, reactive to light and accommodation. No scleral icterus. Extraocular muscles intact.  HEENT: Head atraumatic, normocephalic. Oropharynx and nasopharynx clear. Bruise on her forehead NECK:   Supple, no jugular venous distention. No thyroid enlargement, no tenderness.  LUNGS: Normal breath sounds bilaterally, no wheezing, rales,rhonchi or crepitation. No use of accessory muscles of respiration.  CARDIOVASCULAR: irregularly irregular. No murmurs, rubs, or gallops.  ABDOMEN: Soft, nontender, nondistended. Bowel sounds present. No organomegaly or mass.  EXTREMITIES:rt side of mouth deviated  No pedal edema, cyanosis, or clubbing.  NEUROLOGIC: Cranial nerves II through XII are intact. Muscle strength 5/5 in all extremities except RUE. Sensation intact. Gait not checked.  PSYCHIATRIC: The patient is alert and oriented x 3.  SKIN: No obvious rash, lesion, or ulcer.    LABORATORY PANEL:   CBC  Recent Labs Lab 06/24/16 0606  WBC 6.3  HGB 9.8*  HCT 29.9*  PLT 244   ------------------------------------------------------------------------------------------------------------------  Chemistries   Recent Labs Lab 06/21/16 0012  06/23/16 0406 06/24/16 0606  NA 138  < >  --  142  K 2.4*  < > 4.2 4.2  CL 105  < >  --  118*  CO2 25  < >  --  15*  GLUCOSE 269*  < >  --  175*  BUN 24*  < >  --  40*  CREATININE 1.38*  < >  --  2.55*  CALCIUM 8.8*  < >  --  8.1*  MG  --   < > 1.9  --   AST 37  --   --   --   ALT 19  --   --   --   ALKPHOS 72  --   --   --  BILITOT 0.4  --   --   --   < > = values in this interval not displayed. ------------------------------------------------------------------------------------------------------------------  Cardiac Enzymes  Recent Labs Lab 06/21/16 1832  TROPONINI 0.25*   ------------------------------------------------------------------------------------------------------------------  RADIOLOGY:  Ct Head Wo Contrast  Result Date: 06/24/2016 CLINICAL DATA:  Dysarthria EXAM: CT HEAD WITHOUT CONTRAST TECHNIQUE: Contiguous axial images were obtained from the base of the skull through the vertex without intravenous contrast.  COMPARISON:  06/21/2016 FINDINGS: Brain: There is atrophy and chronic small vessel disease changes. No acute intracranial abnormality. Specifically, no hemorrhage, hydrocephalus, mass lesion, acute infarction, or significant intracranial injury. Vascular: No hyperdense vessel or unexpected calcification. Skull: No acute calvarial abnormality. Sinuses/Orbits: Visualized paranasal sinuses and mastoids clear. Orbital soft tissues unremarkable. Other: None IMPRESSION: No acute intracranial abnormality. Atrophy, chronic microvascular disease. Electronically Signed   By: Charlett NoseKevin  Dover M.D.   On: 06/24/2016 10:58   [place] Venous Img Upper Uni Right  Result Date: 06/24/2016 CLINICAL DATA:  Larey SeatFell. Right hand swelling. On Plavix. Diabetes, previous tobacco abuse. EXAM: RIGHT UPPER EXTREMITY VENOUS DOPPLER ULTRASOUND TECHNIQUE: Gray-scale sonography with graded compression, as well as color Doppler and duplex ultrasound were performed to evaluate the upper extremity deep venous system from the level of the subclavian vein and including the jugular, axillary, basilic and upper cephalic vein. Spectral Doppler was utilized to evaluate flow at rest and with distal augmentation maneuvers. COMPARISON:  None. FINDINGS: Thrombus within deep veins:  None visualized. Compressibility of deep veins:  Normal. Duplex waveform respiratory phasicity:  Normal. Duplex waveform response to augmentation:  Normal. Venous reflux:  None visualized. Other findings: Subcutaneous edema. Limited images of the contralateral left subclavian vein unremarkable. IMPRESSION: 1. Negative for right upper extremity DVT. Electronically Signed   By: Corlis Leak  Hassell M.D.   On: 06/24/2016 13:34    EKG:   Orders placed or performed during the hospital encounter of 06/21/16  . EKG 12-Lead  . EKG 12-Lead    ASSESSMENT AND PLAN:   Patient is coming with a fall and daughter is resting a different nursing home  Assessment and plan;    # RUE weakness and  dysarthria Stroke w/u ct head neg Recent carotid dopplers 12/13 ICA stenosis <50 % ECHO 12/4 - 60 -65 % EF Passed bed side swallow eval, dysphagia diet per ST Neuro consult placed PT eval Rue doppler neg dvt  # fall:with the left scalp frontal contusion  Patient to the daughter is requesting the different at nursing home.   #abnormal urinalysis with the white blood cell clumps Pending urine culture and sensitivity Patient was on Keflex for 14 days prior to the admission. Started her on IV Rocephin  # .severe hypokalemia secondary to GI losses Improved today's potassium is at the 4.7 likely secondary to GI losses:    #Clostridium difficult colitis Patient is started on by mouth vancomycin  #paroxysmal atrial fibrillation, new diagnosis.   Cardizem CD 120 mg by mouth once daily, metoprolol 50 mg by mouth twice a day,  Cardiology has recommended  eliquis 2.5 mg by mouth twice a day for anticoagulation Appreciate cardiology recommendations  Discontinue Plavix  #. Hypothyroidism ;continue Synthroid  #History of dementia, depression patient is on Seroquel, Zoloft,;,aricept, requested that we continue these medicines.  # GERD continue PPIs  # essential hypertension: Controlled  # diabetes mellitus -uncontrolled hemoglobin A1c 8.9 Poor by mouth intake this morning Patient is started on Lantus 9 units subcutaneous  Daily Sliding scale insulin. NovoLog 6 units 3 times  a day with each meal Appreciate diabetic coordinator recommendations  # mild acute renal failure due to poor by mouth intake, diarrhea:  Continue IV fluids Avoid nephrotoxins Check BMP in a.m.  # mildly elevated troponins  likely due to fall/demand ischemiafrom paroxysmal atrial fibrillation troponins not trending0.29-0.22-0.25 . Denies any palpitations EKG ordered Follow-up with cardiology  PT evaluation pending Follow-up with case management    All the records are reviewed and case discussed with Care  Management/Social Workerr. Management plans discussed with the patient, family and they are in agreement.  CODE STATUS: DO NOT RESUSCITATE daughter is the healthcare power of attorney  TOTAL TIME TAKING CARE OF THIS PATIENT: 36 minutes.   POSSIBLE D/C IN1-2DAYS, DEPENDING ON CLINICAL CONDITION.  Note: This dictation was prepared with Dragon dictation along with smaller phrase technology. Any transcriptional errors that result from this process are unintentional.   Ramonita LabGouru, Ngoc Detjen M.D on 06/24/2016 at 1:48 PM  Between 7am to 6pm - Pager - [phone] After 6pm go to [email or web address] - password [email or web address] [facility]  [facility]  Office  [phone]  CC: Primary care physician; Mariam DollarPHELPS, ANNE F, MD

## 2016-06-24 NOTE — Consult Note (Signed)
Reason for Consult:R facial droop Referring Physician: Dr. Amado Coe  CC: R facial droop   HPI: Julie Burch is an 82 y.o. female with a known history of Hypertension, recurrent urinary tract infection, CVA, depression, diabetes mellitus presented to the emergency room three days ago after she had a fall in the nursing home. Patient was trying to get out of the bed she lost balance and fell down. In the emergency room she is lethargic with decreased responsiveness. Mentation slightly improved from admission.   Past Medical History:  Diagnosis Date  . Depression   . Diabetes mellitus without complication (HCC)   . GERD (gastroesophageal reflux disease)   . Hypertension   . Recurrent UTI   . Stroke [facility])    TIA    Past Surgical History:  Procedure Laterality Date  . TONSILLECTOMY      Family History  Problem Relation Age of Onset  . Family history unknown: Yes    Social History:  reports that she has quit smoking. She has never used smokeless tobacco. She reports that she does not drink alcohol or use drugs.  Allergies  Allergen Reactions  . Sulfa Antibiotics Hives and Other (See Comments)    Other Reaction: Allergy  . Alendronate Other (See Comments)    Other Reaction: GI UPSET  . Aspirin Other (See Comments)    Reaction:  Intracranial hemorrhage   . Ciprofloxacin Other (See Comments)    Reaction:  Hallucinations/severe agitation    . Diovan [Valsartan] Other (See Comments)    High potassium Reaction:  High potassium levels   . Fosamax [Alendronate Sodium] Other (See Comments)    Reaction:  GI upset     Medications: I have reviewed the patient's current medications.  ROS: Unable to obtain due to confusion   Physical Examination: Blood pressure (!) 112/56, pulse 99, temperature 97.7 F (36.5 C), temperature source Oral, resp. rate 20, height 5\' 4"  (1.626 m), weight 50.4 kg (111 lb 3.2 oz), SpO2 98 %.  Follows minimal commands No clear  R facial droop present   Generalized weakness 4/5 b/l upper and lower extremity   Laboratory Studies:   Basic Metabolic Panel:  Recent Labs Lab 06/21/16 0012 06/21/16 0548 06/21/16 1832 06/22/16 0546 06/23/16 0406 06/24/16 0606  NA 138 141  --  138  --  142  K 2.4* 3.1* 3.9 4.7 4.2 4.2  CL 105 110  --  110  --  118*  CO2 25 22  --  18*  --  15*  GLUCOSE 269* 131*  --  364*  --  175*  BUN 24* 24*  --  25*  --  40*  CREATININE 1.38* 1.18*  --  1.30*  --  2.55*  CALCIUM 8.8* 8.3*  --  8.1*  --  8.1*  MG  --   --  1.7  --  1.9  --     Liver Function Tests:  Recent Labs Lab 06/21/16 0012  AST 37  ALT 19  ALKPHOS 72  BILITOT 0.4  PROT 6.2*  ALBUMIN 3.1*   No results for input(s): LIPASE, AMYLASE in the last 168 hours. No results for input(s): AMMONIA in the last 168 hours.  CBC:  Recent Labs Lab 06/21/16 0012 06/21/16 0548 06/24/16 0606  WBC 5.5 5.5 6.3  HGB 10.0* 9.5* 9.8*  HCT 30.4* 28.1* 29.9*  MCV 93.3 94.5 95.7  PLT 248 228 244    Cardiac Enzymes:  Recent Labs Lab 06/21/16 0012 06/21/16 0548 06/21/16 1131  06/21/16 1832  TROPONINI 0.22* 0.29* 0.22* 0.25*    BNP: Invalid input(s): POCBNP  CBG:  Recent Labs Lab 06/23/16 1158 06/23/16 1700 06/23/16 2046 06/24/16 0738 06/24/16 1146  GLUCAP 182* 214* 175* 161* 224*    Microbiology: Results for orders placed or performed during the hospital encounter of 06/21/16  Urine culture     Status: Abnormal   Collection Time: 06/22/16  2:24 AM  Result Value Ref Range Status   Specimen Description URINE, CATHETERIZED  Final   Special Requests Normal  Final   Culture (A)  Final    >=100,000 COLONIES/mL YEAST [facility]    Report Status 06/24/2016 FINAL  Final  C difficile quick scan w PCR reflex     Status: Abnormal   Collection Time: 06/22/16  9:59 PM  Result Value Ref Range Status   C Diff antigen POSITIVE (A) NEGATIVE Final   C Diff toxin NEGATIVE NEGATIVE Final   C Diff interpretation  Results are indeterminate. See PCR results.  Final  Clostridium Difficile by PCR     Status: Abnormal   Collection Time: 06/22/16  9:59 PM  Result Value Ref Range Status   Toxigenic C Difficile by pcr POSITIVE (A) NEGATIVE Final    Comment: Positive for toxigenic C. difficile with little to no toxin production. Only treat if clinical presentation suggests symptomatic illness.    Coagulation Studies: No results for input(s): LABPROT, INR in the last 72 hours.  Urinalysis:  Recent Labs Lab 06/21/16 0224  COLORURINE YELLOW*  LABSPEC 1.023  PHURINE 6.0  GLUCOSEU >=500*  HGBUR NEGATIVE  BILIRUBINUR NEGATIVE  KETONESUR 5*  PROTEINUR 30*  NITRITE NEGATIVE  LEUKOCYTESUR SMALL*    Lipid Panel:     Component Value Date/Time   CHOL 93 06/21/2016 0548   TRIG 73 06/21/2016 0548   HDL 40 (L) 06/21/2016 0548   CHOLHDL 2.3 06/21/2016 0548   VLDL 15 06/21/2016 0548   LDLCALC 38 06/21/2016 0548    HgbA1C:  Lab Results  Component Value Date   HGBA1C 8.9 (H) 06/11/2016    Urine Drug Screen:  No results found for: LABOPIA, COCAINSCRNUR, LABBENZ, AMPHETMU, THCU, LABBARB  Alcohol Level: No results for input(s): ETH in the last 168 hours.  Other results: EKG: atrial fibrillation,   Imaging: Ct Head Wo Contrast  Result Date: 06/24/2016 CLINICAL DATA:  Dysarthria EXAM: CT HEAD WITHOUT CONTRAST TECHNIQUE: Contiguous axial images were obtained from the base of the skull through the vertex without intravenous contrast. COMPARISON:  06/21/2016 FINDINGS: Brain: There is atrophy and chronic small vessel disease changes. No acute intracranial abnormality. Specifically, no hemorrhage, hydrocephalus, mass lesion, acute infarction, or significant intracranial injury. Vascular: No hyperdense vessel or unexpected calcification. Skull: No acute calvarial abnormality. Sinuses/Orbits: Visualized paranasal sinuses and mastoids clear. Orbital soft tissues unremarkable. Other: None IMPRESSION: No acute  intracranial abnormality. Atrophy, chronic microvascular disease. Electronically Signed   By: Charlett NoseKevin  Dover M.D.   On: 06/24/2016 10:58   [place] Venous Img Upper Uni Right  Result Date: 06/24/2016 CLINICAL DATA:  Larey SeatFell. Right hand swelling. On Plavix. Diabetes, previous tobacco abuse. EXAM: RIGHT UPPER EXTREMITY VENOUS DOPPLER ULTRASOUND TECHNIQUE: Gray-scale sonography with graded compression, as well as color Doppler and duplex ultrasound were performed to evaluate the upper extremity deep venous system from the level of the subclavian vein and including the jugular, axillary, basilic and upper cephalic vein. Spectral Doppler was utilized to evaluate flow at rest and with distal augmentation maneuvers. COMPARISON:  None. FINDINGS:  Thrombus within deep veins:  None visualized. Compressibility of deep veins:  Normal. Duplex waveform respiratory phasicity:  Normal. Duplex waveform response to augmentation:  Normal. Venous reflux:  None visualized. Other findings: Subcutaneous edema. Limited images of the contralateral left subclavian vein unremarkable. IMPRESSION: 1. Negative for right upper extremity DVT. Electronically Signed   By: Corlis Leak  Hassell M.D.   On: 06/24/2016 13:34     Assessment/Plan:  82 y.o. female with a known history of Hypertension, recurrent urinary tract infection, CVA, depression, diabetes mellitus presented to the emergency room three days ago after she had a fall in the nursing home. Patient was trying to get out of the bed she lost balance and fell down. In the emergency room she is lethargic with decreased responsiveness. Mentation slightly improved from admission.   Neurology consulted for R facial droop which appears to have resolved.  CTH no acute abnormalities Suspect there is a potential for embolic stroke.  Pt is already on anticoagulation, not sure MRI will change management at this time as stroke would likely be embolic in nature and pt has improved.    Pauletta BrownsZEYLIKMAN,  Jerimie Mancuso  06/24/2016, 3:29 PM

## 2016-06-24 NOTE — Progress Notes (Signed)
Patient ID: Julie DikeMary Burch, female   DOB: 05/14/1927, 82 y.o.   MRN: [id number]  Heart rate reasonably controlled in atrial fibrillation.  On low dose Eliquis 2.5 mg bid.  On metoprolol and diltiazem.  No changes to her cardiac regimen.  Will see again Monday unless called.   Marca AnconaDalton Leavy Heatherly 06/24/2016 12:23 PM

## 2016-06-25 ENCOUNTER — Inpatient Hospital Stay: Payer: Medicare Other

## 2016-06-25 LAB — BASIC METABOLIC PANEL
Anion gap: 6 (ref 5–15)
BUN: 38 mg/dL — AB (ref 6–20)
CHLORIDE: 116 mmol/L — AB (ref 101–111)
CO2: 19 mmol/L — AB (ref 22–32)
CREATININE: 2.07 mg/dL — AB (ref 0.44–1.00)
Calcium: 7.9 mg/dL — ABNORMAL LOW (ref 8.9–10.3)
GFR calc Af Amer: 24 mL/min — ABNORMAL LOW (ref >60)
GFR calc non Af Amer: 20 mL/min — ABNORMAL LOW (ref >60)
GLUCOSE: 154 mg/dL — AB (ref 65–99)
POTASSIUM: 3.9 mmol/L (ref 3.5–5.1)
Sodium: 141 mmol/L (ref 135–145)

## 2016-06-25 LAB — GLUCOSE, CAPILLARY
GLUCOSE-CAPILLARY: 98 mg/dL (ref 65–99)
Glucose-Capillary: 152 mg/dL — ABNORMAL HIGH (ref 65–99)
Glucose-Capillary: 133 mg/dL — ABNORMAL HIGH (ref 65–99)
Glucose-Capillary: 160 mg/dL — ABNORMAL HIGH (ref 65–99)

## 2016-06-25 LAB — MAGNESIUM: MAGNESIUM: 1.9 mg/dL (ref 1.7–2.4)

## 2016-06-25 MED ORDER — DIPHENHYDRAMINE HCL 12.5 MG/5ML PO ELIX: 12.5000 mg | ORAL_SOLUTION | Freq: Every evening | ORAL | Status: DC | PRN

## 2016-06-25 MED ORDER — IPRATROPIUM-ALBUTEROL 0.5-2.5 (3) MG/3ML IN SOLN: 3.0000 mL | Freq: Four times a day (QID) | RESPIRATORY_TRACT | Status: DC | PRN

## 2016-06-25 MED ORDER — FUROSEMIDE 10 MG/ML IJ SOLN
20.0000 mg | Freq: Two times a day (BID) | INTRAMUSCULAR | Status: DC
  Administered 2016-06-25 – 2016-06-28 (×6): 20 mg via INTRAVENOUS
  Filled 2016-06-25 (×6): qty 2

## 2016-06-25 MED ORDER — FLUCONAZOLE 100 MG PO TABS
100.0000 mg | ORAL_TABLET | Freq: Every day | ORAL | Status: DC
  Administered 2016-06-25 – 2016-06-26 (×2): 100 mg via ORAL
  Filled 2016-06-25 (×2): qty 1

## 2016-06-25 MED ORDER — DIPHENHYDRAMINE HCL 25 MG PO CAPS: 12.5000 mg | ORAL_CAPSULE | Freq: Every evening | ORAL | Status: DC | PRN

## 2016-06-25 NOTE — Progress Notes (Signed)
[facility] [place] at [facility]   PATIENT NAME: Julie Burch    MR#:  [id number]  DATE OF BIRTH:  1927-06-07  SUBJECTIVE:  CHIEF COMPLAINT:  Patient's   lethargic and not talking much. Patient has baseline dysarthria and right-sided weakness from old stroke. Still has facial droop REVIEW OF SYSTEMS:  ROS limited as pt is lethargic  DRUG ALLERGIES:   Allergies  Allergen Reactions  . Sulfa Antibiotics Hives and Other (See Comments)    Other Reaction: Allergy  . Alendronate Other (See Comments)    Other Reaction: GI UPSET  . Aspirin Other (See Comments)    Reaction:  Intracranial hemorrhage   . Ciprofloxacin Other (See Comments)    Reaction:  Hallucinations/severe agitation    . Diovan [Valsartan] Other (See Comments)    High potassium Reaction:  High potassium levels   . Fosamax [Alendronate Sodium] Other (See Comments)    Reaction:  GI upset     VITALS:  Blood pressure 123/86, pulse (!) 128, temperature 97.6 F (36.4 C), temperature source Oral, resp. rate 18, height 5\' 4"  (1.626 m), weight 50.4 kg (111 lb 3.2 oz), SpO2 94 %.  PHYSICAL EXAMINATION:  GENERAL:  82 y.o.-year-old patient lying in the bed with no acute distress.  EYES: Pupils equal, round, reactive to light and accommodation. No scleral icterus.HEENT: Head atraumatic, normocephalic. Oropharynx and nasopharynx clear. Bruise on her forehead NECK:  Supple, no jugular venous distention. No thyroid enlargement, no tenderness.  LUNGS: Moderate breath sounds bilaterally, no wheezing, positive rales,rhonchi or crepitation. No use of accessory muscles of respiration.  CARDIOVASCULAR: irregularly irregular. No murmurs, rubs, or gallops.  ABDOMEN: Soft, nontender, nondistended. Bowel sounds present. No organomegaly or mass.  EXTREMITIES:rt side of mouth deviated  No pedal edema, cyanosis, or clubbing.  NEUROLOGIC: Follows some verbal commands. Lethargic. Right facial droop is present. Muscle  strength 4/5 in all extremities except RUE. Sensation intact. Gait not checked.  PSYCHIATRIC: The patient is disoriented SKIN: No obvious rash, lesion, or ulcer.    LABORATORY PANEL:   CBC  Recent Labs Lab 06/24/16 0606  WBC 6.3  HGB 9.8*  HCT 29.9*  PLT 244   ------------------------------------------------------------------------------------------------------------------  Chemistries   Recent Labs Lab 06/21/16 0012  06/25/16 0459  NA 138  < > 141  K 2.4*  < > 3.9  CL 105  < > 116*  CO2 25  < > 19*  GLUCOSE 269*  < > 154*  BUN 24*  < > 38*  CREATININE 1.38*  < > 2.07*  CALCIUM 8.8*  < > 7.9*  MG  --   < > 1.9  AST 37  --   --   ALT 19  --   --   ALKPHOS 72  --   --   BILITOT 0.4  --   --   < > = values in this interval not displayed. ------------------------------------------------------------------------------------------------------------------  Cardiac Enzymes  Recent Labs Lab 06/21/16 1832  TROPONINI 0.25*   ------------------------------------------------------------------------------------------------------------------  RADIOLOGY:  Dg Chest 2 View  Result Date: 06/25/2016 CLINICAL DATA:  Unresponsive. EXAM: CHEST  2 VIEW COMPARISON:  06/09/2016. FINDINGS: Interval enlargement of the cardiac silhouette and prominence of the interstitial markings, including bilateral Kerley lines. Interval moderate-sized right pleural effusion and small left pleural effusion. Aortic calcifications. Diffuse osteopenia. IMPRESSION: 1. Interval cardiomegaly and changes of congestive heart failure. 2. Aortic atherosclerosis. Electronically Signed   By: Beckie Salts M.D.   On: 06/25/2016 14:07   Ct Head  Wo Contrast  Result Date: 06/24/2016 CLINICAL DATA:  Dysarthria EXAM: CT HEAD WITHOUT CONTRAST TECHNIQUE: Contiguous axial images were obtained from the base of the skull through the vertex without intravenous contrast. COMPARISON:  06/21/2016 FINDINGS: Brain: There is  atrophy and chronic small vessel disease changes. No acute intracranial abnormality. Specifically, no hemorrhage, hydrocephalus, mass lesion, acute infarction, or significant intracranial injury. Vascular: No hyperdense vessel or unexpected calcification. Skull: No acute calvarial abnormality. Sinuses/Orbits: Visualized paranasal sinuses and mastoids clear. Orbital soft tissues unremarkable. Other: None IMPRESSION: No acute intracranial abnormality. Atrophy, chronic microvascular disease. Electronically Signed   By: Charlett NoseKevin  Dover M.D.   On: 06/24/2016 10:58   Mr Brain Wo Contrast  Result Date: 06/25/2016 CLINICAL DATA:  Acute presentation after falling from the bit head. Altered mental status. EXAM: MRI HEAD WITHOUT CONTRAST TECHNIQUE: Multiplanar, multiecho pulse sequences of the brain and surrounding structures were obtained without intravenous contrast. COMPARISON:  CT 06/24/2016.  MRI 06/10/2016. FINDINGS: Brain: Diffusion imaging does not show any acute or subacute infarction. Chronic small-vessel ischemic changes are present throughout the pons. There are old small vessel cerebellar infarctions with hemosiderin deposition. Cerebral hemispheres show chronic small vessel infarctions within the basal ganglia and thalami , most extensive in the left basal ganglia/ external capsule region. Chronic small-vessel changes elsewhere throughout the deep white matter. No large vessel territory infarction. No mass lesion. No acute hemorrhage identified. No hydrocephalus. Central atrophy. No extra-axial fluid collection. Vascular: Major vessels at the base of the brain show flow. Skull and upper cervical spine: Negative Sinuses/Orbits: Negative/normal Other: None significant IMPRESSION: No acute finding by MRI. Atrophy and extensive chronic small vessel ischemic changes throughout the brain. Hemosiderin deposition related to old micro hemorrhagic infarctions within the cerebellum and the left basal ganglia/ external  capsule region. Electronically Signed   By: Paulina FusiMark  Shogry M.D.   On: 06/25/2016 14:58   [place] Venous Img Upper Uni Right  Result Date: 06/24/2016 CLINICAL DATA:  Larey SeatFell. Right hand swelling. On Plavix. Diabetes, previous tobacco abuse. EXAM: RIGHT UPPER EXTREMITY VENOUS DOPPLER ULTRASOUND TECHNIQUE: Gray-scale sonography with graded compression, as well as color Doppler and duplex ultrasound were performed to evaluate the upper extremity deep venous system from the level of the subclavian vein and including the jugular, axillary, basilic and upper cephalic vein. Spectral Doppler was utilized to evaluate flow at rest and with distal augmentation maneuvers. COMPARISON:  None. FINDINGS: Thrombus within deep veins:  None visualized. Compressibility of deep veins:  Normal. Duplex waveform respiratory phasicity:  Normal. Duplex waveform response to augmentation:  Normal. Venous reflux:  None visualized. Other findings: Subcutaneous edema. Limited images of the contralateral left subclavian vein unremarkable. IMPRESSION: 1. Negative for right upper extremity DVT. Electronically Signed   By: Corlis Leak  Hassell M.D.   On: 06/24/2016 13:34    EKG:   Orders placed or performed during the hospital encounter of 06/21/16  . EKG 12-Lead  . EKG 12-Lead    ASSESSMENT AND PLAN:   Patient is coming with a fall and daughter is resting a different nursing home  Assessment and plan   #Acute CHF IV Lasix Monitor intake and output and daily weights Follow-up with cardiology Patient is on Lipitor    # RUE weakness and dysarthria Stroke w/u ct head neg, MRI negative  Recent carotid dopplers 12/13 ICA stenosis <50 % ECHO 12/4 - 60 -65 % EF Passed bed side swallow eval, dysphagia diet per ST Appreciate neuro recommendations  PT eval Rue doppler neg dvt On eliquis  #  fall:with the left scalp frontal contusion  Patient to the daughter is requesting the different at nursing home.   #abnormal urinalysis with the white  blood cell clumps Pending urine culture and sensitivity IV Rocephin discontinued as the cultures are positive with greater than 100,000 colonies of yeast Started on Diflucan as patient is clinically sick  # .severe hypokalemia secondary to GI losses Improved today's potassium is at the 4.7 likely secondary to GI losses:    #Clostridium difficult colitis Patient is started on by mouth vancomycin  #paroxysmal atrial fibrillation, new diagnosis.   Cardizem CD 120 mg by mouth once daily, metoprolol 50 mg by mouth twice a day,  Cardiology has recommended  eliquis 2.5 mg by mouth twice a day for anticoagulation Appreciate cardiology recommendations  Discontinue Plavix  #. Hypothyroidism ;continue Synthroid  #History of dementia, depression patient is on Seroquel, Zoloft,;,aricept, requested that we continue these medicines.  # GERD continue PPIs  # essential hypertension: Controlled  # diabetes mellitus -uncontrolled hemoglobin A1c 8.9 Poor by mouth intake this morning Patient is started on Lantus 9 units subcutaneous  Daily Sliding scale insulin. NovoLog 6 units 3 times a day with each meal Appreciate diabetic coordinator recommendations  # mild acute renal failure due to poor by mouth intake, diarrhea:  discontinue IV fluids Avoid nephrotoxins  nephrology consult is pending patient is on Lasix Check BMP in a.m.    # mildly elevated troponins  likely due to fall/demand ischemiafrom paroxysmal atrial fibrillation troponins not trending0.29-0.22-0.25 . Denies any palpitations EKG ordered Follow-up with cardiology  PT evaluation pending Follow-up with case management    All the records are reviewed and case discussed with Care Management/Social Workerr. Management plans discussed with the patient, family and they are in agreement.  try to reach the daughter regarding the MRI results, left voicemail to call back  CODE STATUS: DO NOT RESUSCITATE daughter is the healthcare  power of attorney  TOTAL TIME TAKING CARE OF THIS PATIENT: 36 minutes.   POSSIBLE D/C IN1-2DAYS, DEPENDING ON CLINICAL CONDITION.  Note: This dictation was prepared with Dragon dictation along with smaller phrase technology. Any transcriptional errors that result from this process are unintentional.   Ramonita LabGouru, Massiah Minjares M.D on 06/25/2016 at 3:13 PM  Between 7am to 6pm - Pager - [phone] After 6pm go to [email or web address] - password [email or web address] [facility]  [place] [place] [facility]  Office  [phone]  CC: Primary care physician; Mariam DollarPHELPS, ANNE F, MD

## 2016-06-25 NOTE — Progress Notes (Signed)
Family Meeting Note  Advance Directive:yes  Today a meeting took place with the Patient's daughter Clement SayresSandra - HCPOA and son in law  Patient is unable to participate due ZO:XWRUEAto:Lacked capacity Disoriented   The following clinical team members were present during this meeting:MD  The following were discussed:Patient's diagnosis: , Patient's progosis: Unable to determine and Goals for treatment: DNR, poor prognosis, clinically deteriorating, and daughter is considering palliative care if no clinical improvement will palliative care consult..  Additional follow-up to be provided:   Hospitalist, palliative care, cardiology, nephrology and urology   time spent during discussion:20 minutes  Glenice Ciccone, Deanna ArtisAruna, MD

## 2016-06-25 NOTE — Progress Notes (Signed)
Patient vomited approximately 10cc of undigested food. No distress noted, will continue to monitor.

## 2016-06-25 NOTE — Progress Notes (Signed)
MEDICATION RELATED CONSULT NOTE - INITIAL   Pharmacy Consult for electrolyte monitoring Indication: hypokalemia  Allergies  Allergen Reactions  . Sulfa Antibiotics Hives and Other (See Comments)    Other Reaction: Allergy  . Alendronate Other (See Comments)    Other Reaction: GI UPSET  . Aspirin Other (See Comments)    Reaction:  Intracranial hemorrhage   . Ciprofloxacin Other (See Comments)    Reaction:  Hallucinations/severe agitation    . Diovan [Valsartan] Other (See Comments)    High potassium Reaction:  High potassium levels   . Fosamax [Alendronate Sodium] Other (See Comments)    Reaction:  GI upset    Patient Measurements: Height: 5\' 4"  (162.6 cm) Weight: 111 lb 3.2 oz (50.4 kg) IBW/kg (Calculated) : 54.7  Vital Signs: Temp: 98 F (36.7 C) (12/17 0431) Temp Source: Oral (12/17 0431) BP: 105/67 (12/17 0431) Pulse Rate: 104 (12/17 0431) Intake/Output from previous day: 12/16 0701 - 12/17 0700 In: -  Out: 550 [Urine:550] Intake/Output from this shift: No intake/output data recorded.  Labs:  Recent Labs  06/23/16 0406 06/24/16 0606 06/25/16 0459  WBC  --  6.3  --   HGB  --  9.8*  --   HCT  --  29.9*  --   PLT  --  244  --   CREATININE  --  2.55* 2.07*  MG 1.9  --  1.9   Estimated Creatinine Clearance: 15.2 mL/min (by C-G formula based on SCr of 2.07 mg/dL (H)).  Assessment: 82 yo female with diarrhea PTA and K of 2.4 on admission  Goal of Therapy:  K 3.5-5.0  Plan:  All electrolytes are WNL this morning - no supplementation required. K and Mg have been WNL x 4 days - will sign off of consult.   Cindi CarbonMary M Ixel Boehning, PharmD, BCPS Clinical Pharmacist 06/25/2016 7:43 AM

## 2016-06-25 NOTE — Consult Note (Signed)
Reason for Consult:R facial droop Referring Physician: Dr. Amado CoeGouru  CC: R facial droop   Discussion with family today at bedside. No improvement regarding speech. Still R facial droop evident.    Past Medical History:  Diagnosis Date  . Depression   . Diabetes mellitus without complication (HCC)   . GERD (gastroesophageal reflux disease)   . Hypertension   . Recurrent UTI   . Stroke [facility](HCC)    TIA    Past Surgical History:  Procedure Laterality Date  . TONSILLECTOMY      Family History  Problem Relation Age of Onset  . Family history unknown: Yes    Social History:  reports that she has quit smoking. She has never used smokeless tobacco. She reports that she does not drink alcohol or use drugs.  Allergies  Allergen Reactions  . Sulfa Antibiotics Hives and Other (See Comments)    Other Reaction: Allergy  . Alendronate Other (See Comments)    Other Reaction: GI UPSET  . Aspirin Other (See Comments)    Reaction:  Intracranial hemorrhage   . Ciprofloxacin Other (See Comments)    Reaction:  Hallucinations/severe agitation    . Diovan [Valsartan] Other (See Comments)    High potassium Reaction:  High potassium levels   . Fosamax [Alendronate Sodium] Other (See Comments)    Reaction:  GI upset     Medications: I have reviewed the patient's current medications.  ROS: Unable to obtain due to confusion   Physical Examination: Blood pressure 123/86, pulse (!) 128, temperature 97.6 F (36.4 C), temperature source Oral, resp. rate 18, height 5\' 4"  (1.626 m), weight 50.4 kg (111 lb 3.2 oz), SpO2 94 %.  Follows minimal commands No clear  R facial droop present  Generalized weakness 4/5 b/l upper and lower extremity   Laboratory Studies:   Basic Metabolic Panel:  Recent Labs Lab 06/21/16 0012 06/21/16 0548 06/21/16 1832 06/22/16 0546 06/23/16 0406 06/24/16 0606 06/25/16 0459  NA 138 141  --  138  --  142 141  K 2.4* 3.1* 3.9 4.7 4.2 4.2 3.9  CL 105 110  --  110   --  118* 116*  CO2 25 22  --  18*  --  15* 19*  GLUCOSE 269* 131*  --  364*  --  175* 154*  BUN 24* 24*  --  25*  --  40* 38*  CREATININE 1.38* 1.18*  --  1.30*  --  2.55* 2.07*  CALCIUM 8.8* 8.3*  --  8.1*  --  8.1* 7.9*  MG  --   --  1.7  --  1.9  --  1.9    Liver Function Tests:  Recent Labs Lab 06/21/16 0012  AST 37  ALT 19  ALKPHOS 72  BILITOT 0.4  PROT 6.2*  ALBUMIN 3.1*   No results for input(s): LIPASE, AMYLASE in the last 168 hours. No results for input(s): AMMONIA in the last 168 hours.  CBC:  Recent Labs Lab 06/21/16 0012 06/21/16 0548 06/24/16 0606  WBC 5.5 5.5 6.3  HGB 10.0* 9.5* 9.8*  HCT 30.4* 28.1* 29.9*  MCV 93.3 94.5 95.7  PLT 248 228 244    Cardiac Enzymes:  Recent Labs Lab 06/21/16 0012 06/21/16 0548 06/21/16 1131 06/21/16 1832  TROPONINI 0.22* 0.29* 0.22* 0.25*    BNP: Invalid input(s): POCBNP  CBG:  Recent Labs Lab 06/24/16 0738 06/24/16 1146 06/24/16 1628 06/24/16 2109 06/25/16 0739  GLUCAP 161* 224* 254* 198* 152*    Microbiology:  Results for orders placed or performed during the hospital encounter of 06/21/16  Urine culture     Status: Abnormal   Collection Time: 06/22/16  2:24 AM  Result Value Ref Range Status   Specimen Description URINE, CATHETERIZED  Final   Special Requests Normal  Final   Culture (A)  Final    >=100,000 COLONIES/mL YEAST [facility]    Report Status 06/24/2016 FINAL  Final  C difficile quick scan w PCR reflex     Status: Abnormal   Collection Time: 06/22/16  9:59 PM  Result Value Ref Range Status   C Diff antigen POSITIVE (A) NEGATIVE Final   C Diff toxin NEGATIVE NEGATIVE Final   C Diff interpretation Results are indeterminate. See PCR results.  Final  Clostridium Difficile by PCR     Status: Abnormal   Collection Time: 06/22/16  9:59 PM  Result Value Ref Range Status   Toxigenic C Difficile by pcr POSITIVE (A) NEGATIVE Final    Comment: Positive for toxigenic C.  difficile with little to no toxin production. Only treat if clinical presentation suggests symptomatic illness.    Coagulation Studies: No results for input(s): LABPROT, INR in the last 72 hours.  Urinalysis:   Recent Labs Lab 06/21/16 0224  COLORURINE YELLOW*  LABSPEC 1.023  PHURINE 6.0  GLUCOSEU >=500*  HGBUR NEGATIVE  BILIRUBINUR NEGATIVE  KETONESUR 5*  PROTEINUR 30*  NITRITE NEGATIVE  LEUKOCYTESUR SMALL*    Lipid Panel:     Component Value Date/Time   CHOL 93 06/21/2016 0548   TRIG 73 06/21/2016 0548   HDL 40 (L) 06/21/2016 0548   CHOLHDL 2.3 06/21/2016 0548   VLDL 15 06/21/2016 0548   LDLCALC 38 06/21/2016 0548    HgbA1C:  Lab Results  Component Value Date   HGBA1C 8.9 (H) 06/11/2016    Urine Drug Screen:  No results found for: LABOPIA, COCAINSCRNUR, LABBENZ, AMPHETMU, THCU, LABBARB  Alcohol Level: No results for input(s): ETH in the last 168 hours.  Other results: EKG: atrial fibrillation,   Imaging: Ct Head Wo Contrast  Result Date: 06/24/2016 CLINICAL DATA:  Dysarthria EXAM: CT HEAD WITHOUT CONTRAST TECHNIQUE: Contiguous axial images were obtained from the base of the skull through the vertex without intravenous contrast. COMPARISON:  06/21/2016 FINDINGS: Brain: There is atrophy and chronic small vessel disease changes. No acute intracranial abnormality. Specifically, no hemorrhage, hydrocephalus, mass lesion, acute infarction, or significant intracranial injury. Vascular: No hyperdense vessel or unexpected calcification. Skull: No acute calvarial abnormality. Sinuses/Orbits: Visualized paranasal sinuses and mastoids clear. Orbital soft tissues unremarkable. Other: None IMPRESSION: No acute intracranial abnormality. Atrophy, chronic microvascular disease. Electronically Signed   By: Charlett NoseKevin  Dover M.D.   On: 06/24/2016 10:58   [place] Venous Img Upper Uni Right  Result Date: 06/24/2016 CLINICAL DATA:  Larey SeatFell. Right hand swelling. On Plavix. Diabetes, previous  tobacco abuse. EXAM: RIGHT UPPER EXTREMITY VENOUS DOPPLER ULTRASOUND TECHNIQUE: Gray-scale sonography with graded compression, as well as color Doppler and duplex ultrasound were performed to evaluate the upper extremity deep venous system from the level of the subclavian vein and including the jugular, axillary, basilic and upper cephalic vein. Spectral Doppler was utilized to evaluate flow at rest and with distal augmentation maneuvers. COMPARISON:  None. FINDINGS: Thrombus within deep veins:  None visualized. Compressibility of deep veins:  Normal. Duplex waveform respiratory phasicity:  Normal. Duplex waveform response to augmentation:  Normal. Venous reflux:  None visualized. Other findings: Subcutaneous edema. Limited images of the contralateral left  subclavian vein unremarkable. IMPRESSION: 1. Negative for right upper extremity DVT. Electronically Signed   By: Corlis Leak M.D.   On: 06/24/2016 13:34     Assessment/Plan:  82 y.o. female with a known history of Hypertension, recurrent urinary tract infection, CVA, depression, diabetes mellitus presented to the emergency room three days ago after she had a fall in the nursing home. Patient was trying to get out of the bed she lost balance and fell down. In the emergency room she is lethargic with decreased responsiveness. Mentation slightly improved from admission.   - Her mentation is likely multifactorial. C-diff on antibiotics are contributing to current state - After discussion with family, agree on MRI brain to look for cardio embolic source in setting of A-fib.  - Pt is already on anticoagulation which will not change the management.     Pauletta Browns  06/25/2016, 1:23 PM

## 2016-06-26 DIAGNOSIS — R1310 Dysphagia, unspecified: Secondary | ICD-10-CM

## 2016-06-26 DIAGNOSIS — J90 Pleural effusion, not elsewhere classified: Secondary | ICD-10-CM

## 2016-06-26 DIAGNOSIS — I5031 Acute diastolic (congestive) heart failure: Secondary | ICD-10-CM

## 2016-06-26 LAB — GLUCOSE, CAPILLARY
GLUCOSE-CAPILLARY: 114 mg/dL — AB (ref 65–99)
GLUCOSE-CAPILLARY: 151 mg/dL — AB (ref 65–99)
Glucose-Capillary: 168 mg/dL — ABNORMAL HIGH (ref 65–99)

## 2016-06-26 LAB — CBC
HCT: 31.4 % — ABNORMAL LOW (ref 35.0–47.0)
HEMOGLOBIN: 9.9 g/dL — AB (ref 12.0–16.0)
MCH: 31.6 pg (ref 26.0–34.0)
MCHC: 31.5 g/dL — AB (ref 32.0–36.0)
MCV: 100.4 fL — ABNORMAL HIGH (ref 80.0–100.0)
Platelets: 227 10*3/uL (ref 150–440)
RBC: 3.13 MIL/uL — AB (ref 3.80–5.20)
RDW: 18.3 % — ABNORMAL HIGH (ref 11.5–14.5)
WBC: 6.2 10*3/uL (ref 3.6–11.0)

## 2016-06-26 LAB — CREATININE, SERUM
Creatinine, Ser: 1.81 mg/dL — ABNORMAL HIGH (ref 0.44–1.00)
GFR, EST AFRICAN AMERICAN: 28 mL/min — AB (ref >60)
GFR, EST NON AFRICAN AMERICAN: 24 mL/min — AB (ref >60)

## 2016-06-26 MED ORDER — ACETAMINOPHEN 325 MG PO TABS: 650.0000 mg | ORAL_TABLET | Freq: Four times a day (QID) | ORAL | Status: DC | PRN

## 2016-06-26 MED ORDER — MORPHINE SULFATE (CONCENTRATE) 10 MG/0.5ML PO SOLN
5.0000 mg | ORAL | Status: DC | PRN
  Administered 2016-06-27 – 2016-06-28 (×4): 5 mg via ORAL
  Filled 2016-06-26 (×5): qty 1

## 2016-06-26 MED ORDER — GLYCOPYRROLATE 0.2 MG/ML IJ SOLN: 0.2000 mg | INTRAMUSCULAR | Status: DC | PRN

## 2016-06-26 MED ORDER — ACETAMINOPHEN 650 MG RE SUPP: 650.0000 mg | Freq: Four times a day (QID) | RECTAL | Status: DC | PRN

## 2016-06-26 MED ORDER — BIOTENE DRY MOUTH MT LIQD: 15.0000 mL | OROMUCOSAL | Status: DC | PRN

## 2016-06-26 MED ORDER — POLYVINYL ALCOHOL 1.4 % OP SOLN: 1.0000 [drp] | Freq: Four times a day (QID) | OPHTHALMIC | Status: DC | PRN

## 2016-06-26 NOTE — Progress Notes (Signed)
Palliative care called twice to see when they are coming to see patient.  Patient remains NPO and family is at bedside.   Also paged Dr. Amado CoeGouru per patient's daughters request.  She is asking for a doctor to fill out FMLA paperwork.

## 2016-06-26 NOTE — Care Management (Signed)
Palliative care consult is pending.  Oral intake is declining.  Discussed calorie count during progression.  neuro has consulted and ordered brain mri- no acute findings.

## 2016-06-26 NOTE — Progress Notes (Signed)
Palliative:  Full note to follow. Discussion with daughter, [name], and son-in-law. Will proceed with full comfort care at end of life. Support provided. Pending my conversation with Ms. Fizer's other 2 children will transition to hospice facility if appropriate. I will follow up tomorrow.   Yong ChannelAlicia Audryana Hockenberry, NP Palliative Medicine Team Pager # [phone] (M-F 8a-5p) Team Phone # [phone] (Nights/Weekends)

## 2016-06-26 NOTE — Progress Notes (Signed)
Patient resting comfortably with family at the bedside.  Per Yong ChannelAlicia Parker, NP, plan is to make patient comfort care.  Awaiting orders.    Shift report given to Greer EeSusan Presto, RN

## 2016-06-26 NOTE — Progress Notes (Signed)
Patient Name: Julie Burch Date of Encounter: 06/26/2016  Primary Cardiologist: new  Hospital Problem List     Principal Problem:   Acute delirium Active Problems:   New onset atrial fibrillation (HCC)   Acute diastolic CHF (congestive heart failure) (HCC)   Recurrent UTI   HTN (hypertension)   Hypothyroidism   Hypokalemia   Elevated troponin   Pressure injury of skin   Altered mental state   C. difficile diarrhea   Bilateral pleural effusion     Subjective   Pt sitting up, some increased wob.  Cooperates w/ exam but doesn't participate in interview.  Dtr @ bedside.  Dtr feels pt looks pretty much the same today as she did yesterday.  Inpatient Medications    . acidophilus  1 capsule Oral BID  . apixaban  2.5 mg Oral BID  . atorvastatin  40 mg Oral q1800  . cholecalciferol  2,000 Units Oral Daily  . diltiazem  120 mg Oral Daily  . famotidine  20 mg Oral Q48H  . fluconazole  100 mg Oral Daily  . furosemide  20 mg Intravenous BID AC  . gabapentin  100 mg Oral TID  . insulin aspart  0-5 Units Subcutaneous QHS  . insulin aspart  0-9 Units Subcutaneous TID WC  . insulin aspart  6 Units Subcutaneous TID WC  . insulin glargine  9 Units Subcutaneous Daily  . levothyroxine  50 mcg Oral QAC breakfast  . mouth rinse  15 mL Mouth Rinse BID  . metoprolol tartrate  25 mg Oral BID  . pantoprazole  40 mg Oral Daily  . QUEtiapine  150 mg Oral QHS  . sertraline  150 mg Oral Daily  . sodium chloride flush  3 mL Intravenous Q12H  . vancomycin  125 mg Oral Q6H  . vitamin B-12  1,000 mcg Oral Daily    Vital Signs    Vitals:   06/25/16 1138 06/25/16 1624 06/25/16 2014 06/26/16 0530  BP: 123/86  103/75 133/68  Pulse: (!) 128 (!) 129 (!) 115 (!) 115  Resp:   18 18  Temp:   97.9 F (36.6 C) 97.6 F (36.4 C)  TempSrc:   Oral Oral  SpO2: 94% 98% 99% 96%  Weight:      Height:        Intake/Output Summary (Last 24 hours) at 06/26/16 1006 Last data filed at 06/26/16 0056  Gross per 24 hour  Intake                0 ml  Output              900 ml  Net             -900 ml   Filed Weights   06/21/16 0013 06/21/16 0525  Weight: 122 lb (55.3 kg) 111 lb 3.2 oz (50.4 kg)    Physical Exam   GEN: Well nourished, well developed, sitting up with some increase wob - use of accessory muscles. HEENT: Grossly normal.  Neck: Supple, no JVD, carotid bruits, or masses. Cardiac: IR, IR, tachy, no murmurs, rubs, or gallops. No clubbing, cyanosis, edema.  Radials/DP/PT 1+ and equal bilaterally.  Respiratory:  Markedly diminished breath sounds bilat with scattered rhonchi.  Increase wob as noted above. GI: Soft, nontender, nondistended, BS + x 4. MS: no deformity or atrophy. Skin: warm and dry, no rash. Psych: Unable to answer orientation questions.  Labs    CBC  Recent Labs  06/24/16 0606  06/26/16 0432  WBC 6.3 6.2  HGB 9.8* 9.9*  HCT 29.9* 31.4*  MCV 95.7 100.4*  PLT 244 227   Basic Metabolic Panel  Recent Labs  06/24/16 0606 06/25/16 0459 06/26/16 0432  NA 142 141  --   K 4.2 3.9  --   CL 118* 116*  --   CO2 15* 19*  --   GLUCOSE 175* 154*  --   BUN 40* 38*  --   CREATININE 2.55* 2.07* 1.81*  CALCIUM 8.1* 7.9*  --   MG  --  1.9  --     Telemetry    Afib, low 100's.  Radiology    Dg Chest 2 View  Result Date: 06/25/2016 CLINICAL DATA:  Unresponsive. EXAM: CHEST  2 VIEW COMPARISON:  06/09/2016. FINDINGS: Interval enlargement of the cardiac silhouette and prominence of the interstitial markings, including bilateral Kerley lines. Interval moderate-sized right pleural effusion and small left pleural effusion. Aortic calcifications. Diffuse osteopenia. IMPRESSION: 1. Interval cardiomegaly and changes of congestive heart failure. 2. Aortic atherosclerosis. Electronically Signed   By: Beckie SaltsSteven  Reid M.D.   On: 06/25/2016 14:07   Ct Head Wo Contrast  Result Date: 06/24/2016 CLINICAL DATA:  Dysarthria EXAM: CT HEAD WITHOUT CONTRAST TECHNIQUE:  Contiguous axial images were obtained from the base of the skull through the vertex without intravenous contrast. COMPARISON:  06/21/2016 FINDINGS: Brain: There is atrophy and chronic small vessel disease changes. No acute intracranial abnormality. Specifically, no hemorrhage, hydrocephalus, mass lesion, acute infarction, or significant intracranial injury. Vascular: No hyperdense vessel or unexpected calcification. Skull: No acute calvarial abnormality. Sinuses/Orbits: Visualized paranasal sinuses and mastoids clear. Orbital soft tissues unremarkable. Other: None IMPRESSION: No acute intracranial abnormality. Atrophy, chronic microvascular disease. Electronically Signed   By: Charlett NoseKevin  Dover M.D.   On: 06/24/2016 10:58   Mr Brain Wo Contrast  Result Date: 06/25/2016 CLINICAL DATA:  Acute presentation after falling from the bit head. Altered mental status. EXAM: MRI HEAD WITHOUT CONTRAST TECHNIQUE: Multiplanar, multiecho pulse sequences of the brain and surrounding structures were obtained without intravenous contrast. COMPARISON:  CT 06/24/2016.  MRI 06/10/2016. FINDINGS: Brain: Diffusion imaging does not show any acute or subacute infarction. Chronic small-vessel ischemic changes are present throughout the pons. There are old small vessel cerebellar infarctions with hemosiderin deposition. Cerebral hemispheres show chronic small vessel infarctions within the basal ganglia and thalami , most extensive in the left basal ganglia/ external capsule region. Chronic small-vessel changes elsewhere throughout the deep white matter. No large vessel territory infarction. No mass lesion. No acute hemorrhage identified. No hydrocephalus. Central atrophy. No extra-axial fluid collection. Vascular: Major vessels at the base of the brain show flow. Skull and upper cervical spine: Negative Sinuses/Orbits: Negative/normal Other: None significant IMPRESSION: No acute finding by MRI. Atrophy and extensive chronic small vessel  ischemic changes throughout the brain. Hemosiderin deposition related to old micro hemorrhagic infarctions within the cerebellum and the left basal ganglia/ external capsule region. Electronically Signed   By: Paulina FusiMark  Shogry M.D.   On: 06/25/2016 14:58   [place] Venous Img Upper Uni Right  Result Date: 06/24/2016 CLINICAL DATA:  Larey SeatFell. Right hand swelling. On Plavix. Diabetes, previous tobacco abuse. EXAM: RIGHT UPPER EXTREMITY VENOUS DOPPLER ULTRASOUND TECHNIQUE: Gray-scale sonography with graded compression, as well as color Doppler and duplex ultrasound were performed to evaluate the upper extremity deep venous system from the level of the subclavian vein and including the jugular, axillary, basilic and upper cephalic vein. Spectral Doppler was utilized to evaluate flow at rest  and with distal augmentation maneuvers. COMPARISON:  None. FINDINGS: Thrombus within deep veins:  None visualized. Compressibility of deep veins:  Normal. Duplex waveform respiratory phasicity:  Normal. Duplex waveform response to augmentation:  Normal. Venous reflux:  None visualized. Other findings: Subcutaneous edema. Limited images of the contralateral left subclavian vein unremarkable. IMPRESSION: 1. Negative for right upper extremity DVT. Electronically Signed   By: Corlis Leak  Hassell M.D.   On: 06/24/2016 13:34    Patient Profile     82 year old female with known history of dementia who is a nursing home resident, recent stroke, recurrent UTIs, previous intracranial hemorrhage, recurrent UTIs and C. difficile. She presented after a fall. She was noted to be in atrial fibrillation with rapid ventricular response.  Hospital course complicated by C diff, AKI, diast CHF, R>L pleural effusions, and possible aspiration.  Assessment & Plan    1. Atrial fibrillation: She is likely going to have chronic atrial fibrillation. Rates have been hovering in low 100's to 1-teens on dilt 120 daily and lopressor 25 bid.  Recommend rate control which is  currently achieved with metoprolol and diltiazem. I switched to diltiazem to long-acting and also twice daily.  She had been on lopressor 50 bid but was noted on 12/15 to become bradycardic with 2 second pauses.  Thus, reasonable rate control with goal resting HR of < 110 is most appropriate at this time.  Cont current doses of  blocker and dilt.  CHADS2VASc of at least 7 (HTN, age x 2, DM, stroke x 2, female). Cont eliquis 2.5 bid.   2. Essential hypertension: Cont  blocker and dilt.   3. Elevated troponin: Likely due to supply demand ischemia. No plans for ischemic cardiac workup.  Echo showed nl EF.  4.  Acute diastolic CHF:  Pt w/ increased wob over past 24 hrs.  CXR 12/17 w/ CHF.  Lasix 20 IV bid ordered beginning last night. I/O accurate.  Need daily wts.  Follow renal fxn. ? Role of possible aspiration in resp failure.  5.  UTI:  Was on rocephin, changed to diflucan in setting of yeast.   Signed, Nicolasa Duckinghristopher Timathy Newberry NP 06/26/2016, 10:06 AM

## 2016-06-26 NOTE — Progress Notes (Signed)
Pt's family requesting another recliner in the room to stay the night. This RN explained to the family that only one recliner is aloud in the room at a time, for safety reasons, but this RN did offer to move the pt and family to a larger room to accommodate them better.  Pt's family declined wanting to move, they agreed to stay in the same room with only one recliner. Shirley FriarAlexis Miller, RN, BSN

## 2016-06-26 NOTE — Clinical Social Work Note (Signed)
CSW was informed by Palliative Medicine that patient's family are considering hospice facility placement.  CSW to meet with patient's family tomorrow to discuss hospice placement options if family agrees to hospice placement.  Ervin KnackEric R. Andras Grunewald, MSW, Theresia MajorsLCSWA [phone]  Mon-Fri 8a-4:30p 06/26/2016 6:28 PM

## 2016-06-26 NOTE — Progress Notes (Signed)
Family Meeting Note  Advance Directive:yes  Today a meeting took place with the patient's daughter [name] healthcare power of attorney  Patient is unable to participate due HY:[id number]:Lacked capacity Delirious   The following clinical team members were present during this meeting:MD  The following were discussed:Patient's diagnosis: , Patient's progosis: Unable to determine and Goals for treatment: DNR, patient's clinical situation is deteriorating with multiple medical problems Daughter is considering complete comfort care and wants to discuss with other significant family members. Considering hospice home  Additional follow-up to be provided: Hospitalist and palliative care  Time spent during discussion:18 MIN  Ramonita LabGouru, Eryx Zane, MD

## 2016-06-26 NOTE — Consult Note (Signed)
Consultation Note Date: 06/26/2016   Patient Name: Julie Burch  DOB: Jul 28, 1926  MRN: [id number]  Age / Sex: 82 y.o., female  PCP: Maia Petties, MD Referring Physician: Nicholes Mango, MD  Reason for Consultation: Establishing goals of care  HPI/Patient Profile: 82 y.o. female  with past medical history of HTN, recurrent UTI, CVA, depression, diabetes mellitus, frequent falls admitted on 06/21/2016 with fall. Pt has continued to decline with poor intake and concern for aspiration. Palliative care consulted to assist with [place] conversations.   Clinical Assessment and Goals of Care: I met today at Ms. Bansal's bedside with her daughter, [name] [facility]), and her Sandra's husband. We had a long discussion regarding Ms. Hashimi's history and declining health. [name] was able to recognize that her mother is now at EOL. We discussed what to expect at EOL and full comfort care. [name] does not want her mother to suffer and she also says her mother has talked about dying and joining her husband recently. We have decided on full comfort care in order to allow for a peaceful and natural death (Ms. Cangelosi's Living Will states this desire). [name] has requested for me to phone her siblings to discuss hospice placement as well. Comfort care was carefully described. All questions/concerns addressed.   Unfortunately Ms. Farve's children do not get along. But after discussing with daughter Sue Lush [phone]) (who will talk with son Richardson Landry [phone])) goals are clear for desire for comfort and hospice placement. Discussed with Dr. Margaretmary Eddy.  Primary Decision Maker HCPOA daughter [name]    SUMMARY OF RECOMMENDATIONS   - Full comfort care  Code Status/Advance Care Planning:  DNR   Symptom Management:   PRN medications added for pain, dyspnea, agitation/anxiety, secretions, nausea.   Comfort feeding: Likely frequent mouth  care with small drops of water and/or ice chips.  Palliative Prophylaxis:   Aspiration, Delirium Protocol, Frequent Pain Assessment, Oral Care and Turn Reposition  Additional Recommendations (Limitations, Scope, Preferences):  Full Comfort Care  Psycho-social/Spiritual:   Desire for further Chaplaincy support:yes  Additional Recommendations: Caregiving  Support/Resources and Education on Hospice  Prognosis:   Hours - Days  Discharge Planning: Hospice facility      Primary Diagnoses: Present on Admission: . Hypokalemia . Elevated troponin . HTN (hypertension) . Hypothyroidism . Recurrent UTI   I have reviewed the medical record, interviewed the patient and family, and examined the patient. The following aspects are pertinent.  Past Medical History:  Diagnosis Date  . Depression   . Diabetes mellitus without complication ([place])   . GERD (gastroesophageal reflux disease)   . Hypertension   . Recurrent UTI   . Stroke [facility])    TIA   Social History   Social History  . Marital status: Widowed    Spouse name: N/A  . Number of children: N/A  . Years of education: N/A   Occupational History  . retired    Social History Main Topics  . Smoking status: Former Research scientist (life sciences)  . Smokeless tobacco: Never Used  . Alcohol use  No  . Drug use: No  . Sexual activity: No   Other Topics Concern  . None   Social History Narrative  . None   Family History  Problem Relation Age of Onset  . Family history unknown: Yes   Scheduled Meds: . acidophilus  1 capsule Oral BID  . apixaban  2.5 mg Oral BID  . atorvastatin  40 mg Oral q1800  . cholecalciferol  2,000 Units Oral Daily  . diltiazem  120 mg Oral Daily  . famotidine  20 mg Oral Q48H  . fluconazole  100 mg Oral Daily  . furosemide  20 mg Intravenous BID AC  . gabapentin  100 mg Oral TID  . insulin aspart  0-5 Units Subcutaneous QHS  . insulin aspart  0-9 Units Subcutaneous TID WC  . insulin aspart  6 Units  Subcutaneous TID WC  . insulin glargine  9 Units Subcutaneous Daily  . levothyroxine  50 mcg Oral QAC breakfast  . mouth rinse  15 mL Mouth Rinse BID  . metoprolol tartrate  25 mg Oral BID  . pantoprazole  40 mg Oral Daily  . QUEtiapine  150 mg Oral QHS  . sertraline  150 mg Oral Daily  . sodium chloride flush  3 mL Intravenous Q12H  . vancomycin  125 mg Oral Q6H  . vitamin B-12  1,000 mcg Oral Daily   Continuous Infusions: PRN Meds:.sodium chloride, acetaminophen, diphenhydrAMINE, haloperidol lactate, ipratropium-albuterol, nitroGLYCERIN, ondansetron (ZOFRAN) IV, sodium chloride flush Allergies  Allergen Reactions  . Sulfa Antibiotics Hives and Other (See Comments)    Other Reaction: Allergy  . Alendronate Other (See Comments)    Other Reaction: GI UPSET  . Aspirin Other (See Comments)    Reaction:  Intracranial hemorrhage   . Ciprofloxacin Other (See Comments)    Reaction:  Hallucinations/severe agitation    . Diovan [Valsartan] Other (See Comments)    High potassium Reaction:  High potassium levels   . Fosamax [Alendronate Sodium] Other (See Comments)    Reaction:  GI upset    Review of Systems  Unable to perform ROS: Acuity of condition    Physical Exam  Constitutional: She appears well-developed.  HENT:  Head: Normocephalic and atraumatic.  Cardiovascular: An irregularly irregular rhythm present. Tachycardia present.   Pulmonary/Chest:  Small to mod congestion  Abdominal: Soft. Normal appearance.  Neurological:  Minimally responsive - attempts to open eyes to voice  Nursing note and vitals reviewed.   Vital Signs: BP (!) 99/44 (BP Location: Left Arm)   Pulse (!) 56   Temp 98.3 F (36.8 C) (Oral)   Resp 18   Ht 5' 4" (1.626 m)   Wt 50.4 kg (111 lb 3.2 oz)   SpO2 91%   BMI 19.09 kg/m  Pain Assessment: No/denies pain   Pain Score: 0-No pain   SpO2: SpO2: 91 % O2 Device:SpO2: 91 % O2 Flow Rate: .O2 Flow Rate (L/min): 2 L/min  IO: Intake/output  summary:  Intake/Output Summary (Last 24 hours) at 06/26/16 1634 Last data filed at 06/26/16 0056  Gross per 24 hour  Intake                0 ml  Output              900 ml  Net             -900 ml    LBM: Last BM Date: 06/25/16 Baseline Weight: Weight: 55.3 kg (122 lb) Most recent weight: Weight:  50.4 kg (111 lb 3.2 oz)     Palliative Assessment/Data: PPS: 10%     Time In: 1430 Time Out: 1540 Time Total: 60mn Greater than 50%  of this time was spent counseling and coordinating care related to the above assessment and plan.  Signed by: AVinie Sill NP Palliative Medicine Team Pager # [phone](M-F 8a-5p) Team Phone # [phone](Nights/Weekends)

## 2016-06-26 NOTE — Progress Notes (Signed)
[facility] [place] at [facility]   PATIENT NAME: Julie DikeMary Burch    MR#:  [id number]  DATE OF BIRTH:  28-May-1927  SUBJECTIVE:  CHIEF COMPLAINT:  Patient's   lethargic and not talking much. Patient has baseline dysarthria and right-sided weakness from old stroke. Patient is is still aspirating with dysphagia diet and according to the RNs report REVIEW OF SYSTEMS:  ROS limited as pt is lethargic  DRUG ALLERGIES:   Allergies  Allergen Reactions  . Sulfa Antibiotics Hives and Other (See Comments)    Other Reaction: Allergy  . Alendronate Other (See Comments)    Other Reaction: GI UPSET  . Aspirin Other (See Comments)    Reaction:  Intracranial hemorrhage   . Ciprofloxacin Other (See Comments)    Reaction:  Hallucinations/severe agitation    . Diovan [Valsartan] Other (See Comments)    High potassium Reaction:  High potassium levels   . Fosamax [Alendronate Sodium] Other (See Comments)    Reaction:  GI upset     VITALS:  Blood pressure (!) 99/44, pulse (!) 56, temperature 98.3 F (36.8 C), temperature source Oral, resp. rate 18, height 5\' 4"  (1.626 m), weight 50.4 kg (111 lb 3.2 oz), SpO2 91 %.  PHYSICAL EXAMINATION:  GENERAL:  82 y.o.-year-old patient lying in the bed with no acute distress.  EYES: Pupils equal, round, reactive to light and accommodation. No scleral icterus.HEENT: Head atraumatic, normocephalic. Oropharynx and nasopharynx clear. Bruise on her forehead NECK:  Supple, no jugular venous distention. No thyroid enlargement, no tenderness.  LUNGS: Moderate breath sounds bilaterally, no wheezing, positive rales,rhonchi or crepitation. No use of accessory muscles of respiration.  CARDIOVASCULAR: irregularly irregular. No murmurs, rubs, or gallops.  ABDOMEN: Soft, nontender, nondistended. Bowel sounds present. No organomegaly or mass.  EXTREMITIES:rt side of mouth deviated  No pedal edema, cyanosis, or clubbing.  NEUROLOGIC: Follows some verbal  commands. Lethargic. Right facial droop is present. Muscle strength 4/5 in all extremities except RUE. Sensation intact. Gait not checked.  PSYCHIATRIC: The patient is disoriented SKIN: No obvious rash, lesion, or ulcer.    LABORATORY PANEL:   CBC  Recent Labs Lab 06/26/16 0432  WBC 6.2  HGB 9.9*  HCT 31.4*  PLT 227   ------------------------------------------------------------------------------------------------------------------  Chemistries   Recent Labs Lab 06/21/16 0012  06/25/16 0459 06/26/16 0432  NA 138  < > 141  --   K 2.4*  < > 3.9  --   CL 105  < > 116*  --   CO2 25  < > 19*  --   GLUCOSE 269*  < > 154*  --   BUN 24*  < > 38*  --   CREATININE 1.38*  < > 2.07* 1.81*  CALCIUM 8.8*  < > 7.9*  --   MG  --   < > 1.9  --   AST 37  --   --   --   ALT 19  --   --   --   ALKPHOS 72  --   --   --   BILITOT 0.4  --   --   --   < > = values in this interval not displayed. ------------------------------------------------------------------------------------------------------------------  Cardiac Enzymes  Recent Labs Lab 06/21/16 1832  TROPONINI 0.25*   ------------------------------------------------------------------------------------------------------------------  RADIOLOGY:  Dg Chest 2 View  Result Date: 06/25/2016 CLINICAL DATA:  Unresponsive. EXAM: CHEST  2 VIEW COMPARISON:  06/09/2016. FINDINGS: Interval enlargement of the cardiac silhouette and prominence of the interstitial  markings, including bilateral Kerley lines. Interval moderate-sized right pleural effusion and small left pleural effusion. Aortic calcifications. Diffuse osteopenia. IMPRESSION: 1. Interval cardiomegaly and changes of congestive heart failure. 2. Aortic atherosclerosis. Electronically Signed   By: Beckie SaltsSteven  Reid M.D.   On: 06/25/2016 14:07   Mr Brain Wo Contrast  Result Date: 06/25/2016 CLINICAL DATA:  Acute presentation after falling from the bit head. Altered mental status. EXAM:  MRI HEAD WITHOUT CONTRAST TECHNIQUE: Multiplanar, multiecho pulse sequences of the brain and surrounding structures were obtained without intravenous contrast. COMPARISON:  CT 06/24/2016.  MRI 06/10/2016. FINDINGS: Brain: Diffusion imaging does not show any acute or subacute infarction. Chronic small-vessel ischemic changes are present throughout the pons. There are old small vessel cerebellar infarctions with hemosiderin deposition. Cerebral hemispheres show chronic small vessel infarctions within the basal ganglia and thalami , most extensive in the left basal ganglia/ external capsule region. Chronic small-vessel changes elsewhere throughout the deep white matter. No large vessel territory infarction. No mass lesion. No acute hemorrhage identified. No hydrocephalus. Central atrophy. No extra-axial fluid collection. Vascular: Major vessels at the base of the brain show flow. Skull and upper cervical spine: Negative Sinuses/Orbits: Negative/normal Other: None significant IMPRESSION: No acute finding by MRI. Atrophy and extensive chronic small vessel ischemic changes throughout the brain. Hemosiderin deposition related to old micro hemorrhagic infarctions within the cerebellum and the left basal ganglia/ external capsule region. Electronically Signed   By: Paulina FusiMark  Shogry M.D.   On: 06/25/2016 14:58    EKG:   Orders placed or performed during the hospital encounter of 06/21/16  . EKG 12-Lead  . EKG 12-Lead    ASSESSMENT AND PLAN:   Patient is coming with a fall and daughter is resting a different nursing home  Assessment and plan  #Failure to thrive Patient's with multiple medical problems and aspirating though she is on dysphagia diet clinically deteriorating. Palliative care consult placed DAUGHTER. [name] is agreeable with the comfort care. Plan is to discuss with other family members and consider hospice   facility, if appropriate   #Acute CHF IV Lasix Monitor intake and output and daily  weights Follow-up with cardiology Patient is on Lipitor   # RUE weakness and dysarthria Stroke w/u ct head neg, MRI negative  Recent carotid dopplers 12/13 ICA stenosis <50 % ECHO 12/4 - 60 -65 % EF Passed bed side swallow eval, dysphagia diet per ST Appreciate neuro recommendations  PT eval Rue doppler neg dvt On eliquis  #UTI with YEAST IV Rocephin discontinued as the cultures are positive with greater than 100,000 colonies of yeast Started on Diflucan as patient is clinically sick  # .severe hypokalemia secondary to GI losses Improved today's potassium is at the 4.7 likely secondary to GI losses:    #Clostridium difficult colitis Patient is started on by mouth vancomycin  #paroxysmal atrial fibrillation, new diagnosis.   Cardizem CD 120 mg by mouth once daily, metoprolol 50 mg by mouth twice a day,  Cardiology has recommended  eliquis 2.5 mg by mouth twice a day for anticoagulation Appreciate cardiology recommendations  Discontinue Plavix  #. Hypothyroidism ;continue Synthroid  #History of dementia, depression patient is on Seroquel, Zoloft,;,aricept, requested that we continue these medicines.  # GERD continue PPIs  # essential hypertension: Controlled  # diabetes mellitus -uncontrolled hemoglobin A1c 8.9 Poor by mouth intake this morning Patient is started on Lantus 9 units subcutaneous  Daily Sliding scale insulin. NovoLog 6 units 3 times a day with each meal Appreciate diabetic coordinator  recommendations  # mild acute renal failure due to poor by mouth intake, diarrhea:  discontinue IV fluids Avoid nephrotoxins  Check BMP in a.m.    # mildly elevated troponins  likely due to fall/demand ischemiafrom paroxysmal atrial fibrillation troponins not trending0.29-0.22-0.25 . Denies any palpitations EKG ordered Follow-up with cardiology  PT evaluation pending Follow-up with case management    All the records are reviewed and case discussed with Care  Management/Social Workerr. Management plans discussed with the patient, family and they are in agreement.  try to reach the daughter regarding the MRI results, left voicemail to call back  CODE STATUS: DO NOT RESUSCITATE daughter is the healthcare power of attorney  TOTAL TIME TAKING CARE OF THIS PATIENT: 36 minutes.   POSSIBLE D/C IN1-2DAYS, DEPENDING ON CLINICAL CONDITION.  Note: This dictation was prepared with Dragon dictation along with smaller phrase technology. Any transcriptional errors that result from this process are unintentional.   Ramonita Lab M.D on 06/26/2016 at 5:56 PM  Between 7am to 6pm - Pager - [phone] After 6pm go to [email or web address] - password [email or web address] [facility]  [place]  Office  [phone]  CC: Primary care physician; Mariam Dollar, MD

## 2016-06-27 DIAGNOSIS — Z515 Encounter for palliative care: Secondary | ICD-10-CM

## 2016-06-27 DIAGNOSIS — R1310 Dysphagia, unspecified: Secondary | ICD-10-CM

## 2016-06-27 DIAGNOSIS — Z7189 Other specified counseling: Secondary | ICD-10-CM

## 2016-06-27 LAB — GLUCOSE, CAPILLARY: GLUCOSE-CAPILLARY: 187 mg/dL — AB (ref 65–99)

## 2016-06-27 NOTE — Clinical Social Work Note (Addendum)
CSW spoke with patient's daughter [name] and their family have agreed to hospice care facility.  CS[facility] was [facility]  CSW contacted hospice facility and they are requesting clinicals to be faxed so they can review patient's eligibility.  CSW awaiting response from hospice facility.  1:30pm  CSW [facility] back [facility], they would like to wait one more day for the hospice facility to make decision based on patient not being symptomatic enough for hospice facility to accept patient.  CSW contacted patient's daughter and updated her that hospice facility will not accept patient today, CSW offered patient other options of [facility], patient's family would like to wait to see how she is doing tomorrow.  Ervin KnackEric R. Shaeley Segall, MSW, LCSWA [phone]  Mon-Fri 8a-4:30p 06/27/2016 10:08 AM

## 2016-06-27 NOTE — Care Management (Signed)
Anticipate transfer to a hospice facility/hospice plan of care 12/20.  She has received one dose of Roxanol for agitation.

## 2016-06-27 NOTE — Progress Notes (Signed)
Daily Progress Note   Patient Name: Julie Burch       Date: 06/27/2016 DOB: [birth year]  Age: 82 y.o. MRN#: [id number] Attending Physician: Nicholes Mango, MD Primary Care Physician: Maia Petties, MD Admit Date: 06/21/2016  Reason for Consultation/Follow-up: Establishing goals of care  Subjective: I met again today with daughter Julie Burch and granddaughter Julie Burch at bedside. Explained that I spoke with Julie Burch's siblings and everyone's desire is for comfort and hospice. I spoke with Julie Burch via phone who also agrees. Julie Burch had some questions about hospice and all answered to satisfaction. Goals are consistently for comfort and all family agree. Will discuss with CSW transition to hospice facility. Discussed with Julie Burch.   Length of Stay: 6  Current Medications: Scheduled Meds:  . furosemide  20 mg Intravenous BID AC  . mouth rinse  15 mL Mouth Rinse BID  . sodium chloride flush  3 mL Intravenous Q12H    Continuous Infusions:   PRN Meds: sodium chloride, acetaminophen **OR** acetaminophen, antiseptic oral rinse, glycopyrrolate, haloperidol lactate, ipratropium-albuterol, morphine CONCENTRATE, ondansetron (ZOFRAN) IV, polyvinyl alcohol, sodium chloride flush  Physical Exam  Constitutional: She appears well-developed. She appears lethargic.  HENT:  Head: Normocephalic.  Cardiovascular: An irregularly irregular rhythm present. Tachycardia present.   Pulmonary/Chest: Effort normal. No accessory muscle usage. No tachypnea. No respiratory distress.  Abdominal: Normal appearance.  Neurological: She appears lethargic.  Attempting more to try and talk but unable to express - falls back asleep  Nursing note and vitals reviewed.           Vital Signs: BP (!) 147/80 (BP Location: Left Arm)    Pulse 91   Temp 98.1 F (36.7 C) (Oral)   Resp 16   Ht '5\' 4"'  (1.626 m)   Wt 50.4 kg (111 lb 3.2 oz)   SpO2 97%   BMI 19.09 kg/m  SpO2: SpO2: 97 % O2 Device: O2 Device: Nasal Cannula O2 Flow Rate: O2 Flow Rate (L/min): 2 L/min  Intake/output summary:  Intake/Output Summary (Last 24 hours) at 06/27/16 0945 Last data filed at 06/27/16 0317  Gross per 24 hour  Intake                3 ml  Output             2800 ml  Net            -  2797 ml   LBM: Last BM Date: 06/26/16 Baseline Weight: Weight: 55.3 kg (122 lb) Most recent weight: Weight: 50.4 kg (111 lb 3.2 oz)       Palliative Assessment/Data: PPS: 10%     Patient Active Problem List   Diagnosis Date Noted  . Acute diastolic CHF (congestive heart failure) ([place]) 06/26/2016  . Bilateral pleural effusion 06/26/2016  . Acute delirium 06/23/2016  . New onset atrial fibrillation ([place]) 06/22/2016  . Altered mental state 06/22/2016  . C. difficile diarrhea 06/22/2016  . Hypokalemia 06/21/2016  . Elevated troponin 06/21/2016  . Pressure injury of skin 06/21/2016  . Septic shock ([place]) 06/08/2016  . Recurrent UTI 02/09/2016  . GERD (gastroesophageal reflux disease) 02/09/2016  . HTN (hypertension) 02/09/2016  . Hypothyroidism 02/09/2016  . Depression 02/09/2016  . Acute on chronic kidney failure ([place]) 02/09/2016  . Diabetes ([place]) 02/09/2016    Palliative Care Assessment & Plan   HPI: 82 y.o. female  with past medical history of HTN, recurrent UTI, CVA, depression, diabetes mellitus, frequent falls admitted on 06/21/2016 with fall. Pt has continued to decline with poor intake and concern for aspiration. Palliative care consulted to assist with [place] conversations.  Assessment: Lying in bed. Lethargic. Provided moisture to mouth.   Recommendations/Plan:  PRN medications added for pain, dyspnea, agitation/anxiety, secretions, nausea.   Comfort feeding: Likely frequent mouth care with small drops of water and/or ice  chips. Her only desire is for moisture.   Goals of Care and Additional Recommendations:  Limitations on Scope of Treatment: Full Comfort Care  Code Status:  DNR  Prognosis:   Hours - Days  Discharge Planning:  Hospice facility  Thank you for allowing the Palliative Medicine Team to assist in the care of this patient.   Time In: 0920 Time Out: 0945 Total Time 102mn Prolonged Time Billed  no       Greater than 50%  of this time was spent counseling and coordinating care related to the above assessment and plan.  AVinie Sill NP Palliative Medicine Team Pager # [phone](M-F 8a-5p) Team Phone # [phone](Nights/Weekends)

## 2016-06-27 NOTE — Progress Notes (Signed)
I have spoken with family again this afternoon. They tell me that she has been somewhat agitated this afternoon. Diarrhea is worse and she has discomfort and moaning with any activity or cleaning. I spoke more with nursing about utilizing pain medication prior to cleaning to ensure comfort.   Yong ChannelAlicia Donnetta Gillin, NP Palliative Medicine Team Pager # [phone] (M-F 8a-5p) Team Phone # [phone] (Nights/Weekends)

## 2016-06-28 MED ORDER — HALOPERIDOL LACTATE 2 MG/ML PO CONC: 1.0000 mg | Freq: Four times a day (QID) | ORAL | 0 refills | Status: AC | PRN

## 2016-06-28 MED ORDER — MORPHINE SULFATE (CONCENTRATE) 10 MG/0.5ML PO SOLN: 5.0000 mg | ORAL | 0 refills | Status: AC | PRN

## 2016-06-28 MED ORDER — HALOPERIDOL LACTATE 2 MG/ML PO CONC
1.0000 mg | Freq: Every day | ORAL | Status: DC
  Filled 2016-06-28: qty 0.5

## 2016-06-28 MED ORDER — METOPROLOL TARTRATE 5 MG/5ML IV SOLN
5.0000 mg | INTRAVENOUS | Status: DC | PRN
Start: 2016-06-28 — End: 2016-06-28
  Administered 2016-06-28: 5 mg via INTRAVENOUS
  Filled 2016-06-28: qty 5

## 2016-06-28 MED ORDER — METOPROLOL TARTRATE 5 MG/5ML IV SOLN: 5.0000 mg | INTRAVENOUS | 0 refills | Status: AC | PRN

## 2016-06-28 MED ORDER — HALOPERIDOL LACTATE 2 MG/ML PO CONC
1.0000 mg | Freq: Four times a day (QID) | ORAL | Status: DC | PRN
  Filled 2016-06-28: qty 0.5

## 2016-06-28 NOTE — Progress Notes (Signed)
Patient currently lying in bed with some facial grimacing. 5mg  oral Morphine given at 1529 which patient spit up almost immediately after. Zofran given for nausea. Patient given another dose of 5mg  oral morphine at 1640 since grimacing continues. Ok to give per MD. Daughter is at bedside. [facility] and [facility] has been called for [facility]. Will continue to monitor patient and keep as comfortable as possible while waiting for arrival of EMS. Emotional support given to patient and patients family.

## 2016-06-28 NOTE — Care Management Important Message (Signed)
Important Message  Patient Details  Name: Julie Burch MRN: [id number] Date of Birth: [birth year]   Medicare Important Message Given:  Yes    Eber HongGreene, Rayven Rettig R, RN 06/28/2016, 1:48 PM

## 2016-06-28 NOTE — Discharge Summary (Signed)
[facility] [place] at [facility]   PATIENT NAME: Julie DikeMary Burch    MR#:  [id number]  DATE OF BIRTH:  1927-04-22  DATE OF ADMISSION:  06/21/2016 ADMITTING PHYSICIAN: Ihor AustinPavan Pyreddy, MD  DATE OF DISCHARGE: 06/28/2016 PRIMARY CARE PHYSICIAN: Mariam DollarPHELPS, ANNE F, MD    ADMISSION DIAGNOSIS:  Pain [R52]  fall  DISCHARGE DIAGNOSIS:  FTT  C DIFF COLITIS Atrial fib  SECONDARY DIAGNOSIS:   Past Medical History:  Diagnosis Date  . Depression   . Diabetes mellitus without complication (HCC)   . GERD (gastroesophageal reflux disease)   . Hypertension   . Recurrent UTI   . Stroke [facility](HCC)    TIA    HOSPITAL COURSE:    #Failure to thrive Patient's with multiple medical problems and aspirating though she is on dysphagia diet clinically deteriorating. Palliative care consult placed DAUGHTER. Julie DavenportSandra is agreeable with the comfort care. Plan is to discuss with other family members and transfer to  hospice   facility   #Acute CHF During hospital course IV Lasix given , Monitored intake and output and daily weights Followed -up with cardiology Patient was  on Lipitor  Currently comfort care  # RUE weakness and dysarthria Stroke w/u ct head neg, MRI negative  Recent carotid dopplers 12/13 ICA stenosis <50 % ECHO 12/4 - 60 -65 % EF Currently comfort care  #UTI with YEAST IV Rocephin discontinued as the cultures are positive with greater than 100,000 colonies of yeast Started on Diflucan as patient is clinically sick, Currently comfort care  # .severe hypokalemia secondary to GI losses Improved    #Clostridium difficult colitis Patient is started on by mouth vancomycin, Currently comfort care  #paroxysmal atrial fibrillation, new diagnosis.   Cardizem CD 120 mg by mouth once daily, metoprolol 50 mg by mouth twice a day,  Cardiology has recommended  eliquis 2.5 mg by mouth twice a day for anticoagulation Appreciate cardiology recommendations   Discontinued  Plavix , Currently comfort care  #. Hypothyroidism ;continue Synthroid  #History of dementia, depression patient is on Seroquel, Zoloft,;,aricept, requested that we continue these medicines.  # GERD c  # essential hypertension:  # diabetes mellitus -uncontrolled hemoglobin A1c 8.9   # mild acute renal failure due to poor by mouth intake, diarrhea:  discontinue IV fluids Avoid nephrotoxins    # mildly elevated troponins  likely due to fall/demand ischemiafrom paroxysmal atrial fibrillation troponins not trending0.29-0.22-0.25 . Currently comfort care   DISCHARGE CONDITIONS:   gaurded  CONSULTS OBTAINED:  Treatment Team:  Iran OuchMuhammad A Arida, MD Audery AmelJohn T Clapacs, MD Pauletta BrownsYuriy Zeylikman, MD Mosetta PigeonHarmeet Singh, MD   PROCEDURES none  DRUG ALLERGIES:   Allergies  Allergen Reactions  . Sulfa Antibiotics Hives and Other (See Comments)    Other Reaction: Allergy  . Alendronate Other (See Comments)    Other Reaction: GI UPSET  . Aspirin Other (See Comments)    Reaction:  Intracranial hemorrhage   . Ciprofloxacin Other (See Comments)    Reaction:  Hallucinations/severe agitation    . Diovan [Valsartan] Other (See Comments)    High potassium Reaction:  High potassium levels   . Fosamax [Alendronate Sodium] Other (See Comments)    Reaction:  GI upset     DISCHARGE MEDICATIONS:   Current Discharge Medication List    START taking these medications   Details  haloperidol (HALDOL) 2 MG/ML solution Take 0.5 mLs (1 mg total) by mouth every 6 (six) hours as needed for agitation. Qty: 30 mL, Refills:  0    metoprolol (LOPRESSOR) 5 MG/5ML SOLN injection Inject 5 mLs (5 mg total) into the vein every 4 (four) hours as needed (SBP>210, diastolic>110,). Qty: 15 mL, Refills: 0    Morphine Sulfate (MORPHINE CONCENTRATE) 10 MG/0.5ML SOLN concentrated solution Take 0.25 mLs (5 mg total) by mouth every 2 (two) hours as needed for severe pain or shortness of breath. Qty:  180 mL, Refills: 0      CONTINUE these medications which have NOT CHANGED   Details  acetaminophen (TYLENOL) 500 MG tablet Take 1,000 mg by mouth every 4 (four) hours as needed for mild pain or headache.    ondansetron (ZOFRAN) 4 MG tablet Take 4 mg by mouth every 8 (eight) hours as needed for nausea or vomiting.      STOP taking these medications     acidophilus (RISAQUAD) CAPS capsule      amLODipine (NORVASC) 10 MG tablet      atorvastatin (LIPITOR) 40 MG tablet      cephALEXin (KEFLEX) 500 MG capsule      cholecalciferol (VITAMIN D) 1000 units tablet      clopidogrel (PLAVIX) 75 MG tablet      Coenzyme Q10 (COQ-10) 100 MG CAPS      donepezil (ARICEPT) 5 MG tablet      esomeprazole (NEXIUM) 40 MG capsule      famotidine (PEPCID) 20 MG tablet      fosfomycin (MONUROL) 3 g PACK      glucose 4 GM chewable tablet      hydrALAZINE (APRESOLINE) 50 MG tablet      insulin glargine (LANTUS) 100 UNIT/ML injection      insulin lispro (HUMALOG) 100 UNIT/ML injection      levothyroxine (SYNTHROID, LEVOTHROID) 50 MCG tablet      metroNIDAZOLE (FLAGYL) 500 MG tablet      mouth rinse LIQD solution      QUEtiapine (SEROQUEL) 50 MG tablet      sertraline (ZOLOFT) 100 MG tablet      vitamin B-12 (CYANOCOBALAMIN) 1000 MCG tablet          DISCHARGE INSTRUCTIONS:  [name]  DIET:  Dysphagia diet with aspiration precautions  DISCHARGE CONDITION:  gaurded  ACTIVITY:  Bedrest  OXYGEN:  Home Oxygen: Yes.     Oxygen Delivery: 2 liters/min via Patient connected to nasal cannula oxygen  DISCHARGE LOCATION:  Hospice home   If you experience worsening of your admission symptoms, develop shortness of breath, life threatening emergency, suicidal or homicidal thoughts you must seek medical attention immediately by calling 911 or calling your MD immediately  if symptoms less severe.  You Must read complete instructions/literature along with all the possible adverse  reactions/side effects for all the Medicines you take and that have been prescribed to you. Take any new Medicines after you have completely understood and accpet all the possible adverse reactions/side effects.   Please note  You were cared for by a hospitalist during your hospital stay. If you have any questions about your discharge medications or the care you received while you were in the hospital after you are discharged, you can call the unit and asked to speak with the hospitalist on call if the hospitalist that took care of you is not available. Once you are discharged, your primary care physician will handle any further medical issues. Please note that NO REFILLS for any discharge medications will be authorized once you are discharged, as it is imperative that you return to your  primary care physician (or establish a relationship with a primary care physician if you do not have one) for your aftercare needs so that they can reassess your need for medications and monitor your lab values.     Today  Chief Complaint  Patient presents with  . Fall   Pt is delerious,  Comfort care. Transfer to [name]  ROS: unobtainable  VITAL SIGNS:  Blood pressure 136/77, pulse (!) 135, temperature 97.8 F (36.6 C), temperature source Oral, resp. rate 16, height 5\' 4"  (1.626 m), weight 50.4 kg (111 lb 3.2 oz), SpO2 96 %.  I/O:    Intake/Output Summary (Last 24 hours) at 06/28/16 1452 Last data filed at 06/28/16 [id number]  Gross per 24 hour  Intake                3 ml  Output             1050 ml  Net            -1047 ml    PHYSICAL EXAMINATION:  GENERAL:  82 y.o.-year-old patient lying in the bed with no acute distress.    DATA REVIEW:   CBC  Recent Labs Lab 06/26/16 0432  WBC 6.2  HGB 9.9*  HCT 31.4*  PLT 227    Chemistries   Recent Labs Lab 06/25/16 0459 06/26/16 0432  NA 141  --   K 3.9  --   CL 116*  --   CO2 19*  --   GLUCOSE 154*  --   BUN 38*  --   CREATININE  2.07* 1.81*  CALCIUM 7.9*  --   MG 1.9  --     Cardiac Enzymes  Recent Labs Lab 06/21/16 1832  TROPONINI 0.25*    Microbiology Results  Results for orders placed or performed during the hospital encounter of 06/21/16  Urine culture     Status: Abnormal   Collection Time: 06/22/16  2:24 AM  Result Value Ref Range Status   Specimen Description URINE, CATHETERIZED  Final   Special Requests Normal  Final   Culture (A)  Final    >=100,000 COLONIES/mL YEAST [facility]    Report Status 06/24/2016 FINAL  Final  C difficile quick scan w PCR reflex     Status: Abnormal   Collection Time: 06/22/16  9:59 PM  Result Value Ref Range Status   C Diff antigen POSITIVE (A) NEGATIVE Final   C Diff toxin NEGATIVE NEGATIVE Final   C Diff interpretation Results are indeterminate. See PCR results.  Final  Clostridium Difficile by PCR     Status: Abnormal   Collection Time: 06/22/16  9:59 PM  Result Value Ref Range Status   Toxigenic C Difficile by pcr POSITIVE (A) NEGATIVE Final    Comment: Positive for toxigenic C. difficile with little to no toxin production. Only treat if clinical presentation suggests symptomatic illness.    RADIOLOGY:  Dg Chest 2 View  Result Date: 06/25/2016 CLINICAL DATA:  Unresponsive. EXAM: CHEST  2 VIEW COMPARISON:  06/09/2016. FINDINGS: Interval enlargement of the cardiac silhouette and prominence of the interstitial markings, including bilateral Kerley lines. Interval moderate-sized right pleural effusion and small left pleural effusion. Aortic calcifications. Diffuse osteopenia. IMPRESSION: 1. Interval cardiomegaly and changes of congestive heart failure. 2. Aortic atherosclerosis. Electronically Signed   By: Beckie SaltsSteven  Reid M.D.   On: 06/25/2016 14:07   Mr Brain Wo Contrast  Result Date: 06/25/2016 CLINICAL DATA:  Acute presentation after falling  from the bit head. Altered mental status. EXAM: MRI HEAD WITHOUT CONTRAST TECHNIQUE: Multiplanar,  multiecho pulse sequences of the brain and surrounding structures were obtained without intravenous contrast. COMPARISON:  CT 06/24/2016.  MRI 06/10/2016. FINDINGS: Brain: Diffusion imaging does not show any acute or subacute infarction. Chronic small-vessel ischemic changes are present throughout the pons. There are old small vessel cerebellar infarctions with hemosiderin deposition. Cerebral hemispheres show chronic small vessel infarctions within the basal ganglia and thalami , most extensive in the left basal ganglia/ external capsule region. Chronic small-vessel changes elsewhere throughout the deep white matter. No large vessel territory infarction. No mass lesion. No acute hemorrhage identified. No hydrocephalus. Central atrophy. No extra-axial fluid collection. Vascular: Major vessels at the base of the brain show flow. Skull and upper cervical spine: Negative Sinuses/Orbits: Negative/normal Other: None significant IMPRESSION: No acute finding by MRI. Atrophy and extensive chronic small vessel ischemic changes throughout the brain. Hemosiderin deposition related to old micro hemorrhagic infarctions within the cerebellum and the left basal ganglia/ external capsule region. Electronically Signed   By: Paulina Fusi M.D.   On: 06/25/2016 14:58    EKG:   Orders placed or performed during the hospital encounter of 06/21/16  . EKG 12-Lead  . EKG 12-Lead      Management plans discussed with the patient, family and they are in agreement.  CODE STATUS:     Code Status Orders        Start     Ordered   06/26/16 1640  Do not attempt resuscitation (DNR)  Continuous    Question Answer Comment  In the event of cardiac or respiratory ARREST Do not call a "code blue"   In the event of cardiac or respiratory ARREST Do not perform Intubation, CPR, defibrillation or ACLS   In the event of cardiac or respiratory ARREST Use medication by any route, position, wound care, and other measures to relive pain  and suffering. May use oxygen, suction and manual treatment of airway obstruction as needed for comfort.      06/26/16 1640    Code Status History    Date Active Date Inactive Code Status Order ID Comments User Context   06/21/2016  5:20 AM 06/26/2016  4:40 PM DNR [id number]  Ihor Austin, MD Inpatient   06/08/2016 10:24 PM 06/15/2016  3:50 PM DNR [id number]  Gwendolyn Fill, NP Inpatient   06/08/2016  9:28 AM 06/08/2016 10:23 PM Full Code [id number]  Erin Fulling, MD ED   02/09/2016 12:58 AM 02/10/2016  2:50 PM Full Code [id number]  Oralia Manis, MD ED    Advance Directive Documentation   Flowsheet Row Most Recent Value  Type of Advance Directive  Healthcare Power of Attorney  Pre-existing out of facility DNR order (yellow form or pink MOST form)  No data  "MOST" Form in Place?  No data      TOTAL TIME TAKING CARE OF THIS PATIENT: 42 minutes.   Note: This dictation was prepared with Dragon dictation along with smaller phrase technology. Any transcriptional errors that result from this process are unintentional.   @MEC @  on 06/28/2016 at 2:52 PM  Between 7am to 6pm - Pager - [phone]  After 6pm go to [email or web address] - password [email or web address] [facility]  [place]  [facility]  Office  [phone]  CC: Primary care physician; Mariam Dollar, MD

## 2016-06-28 NOTE — Clinical Social Work Note (Addendum)
[name] requested updated palliative notes and current MAR to review patient's information to see if they can accept patient today.  CSW awaiting call back from hospice facility.  2:00pm  [facility] and [facility].  [name] would like to receive patient around 5:30pm or 6:00pm.  CSW informed patient's daughter who was at bedside that they can accept patient today, CSW updated case manager and physician.   Ervin KnackEric R. Chaney Ingram, MSW, Theresia MajorsLCSWA [phone]  Mon-Fri 8a-4:30p 06/28/2016 12:56 PM

## 2016-06-28 NOTE — Progress Notes (Signed)
[facility] - [place] [facility]   PATIENT NAME: Julie Burch    MR#:  [id number]  DATE OF BIRTH:  Nov 11, 1926  SUBJECTIVE:  CHIEF COMPLAINT:  Pt is not clinically improving, daughter Julie Burch and other family members are agreable with comfort care  REVIEW OF SYSTEMS:  Ros unobtainable  DRUG ALLERGIES:   Allergies  Allergen Reactions  . Sulfa Antibiotics Hives and Other (See Comments)    Other Reaction: Allergy  . Alendronate Other (See Comments)    Other Reaction: GI UPSET  . Aspirin Other (See Comments)    Reaction:  Intracranial hemorrhage   . Ciprofloxacin Other (See Comments)    Reaction:  Hallucinations/severe agitation    . Diovan [Valsartan] Other (See Comments)    High potassium Reaction:  High potassium levels   . Fosamax [Alendronate Sodium] Other (See Comments)    Reaction:  GI upset     VITALS:  Blood pressure 136/77, pulse (!) 135, temperature 97.8 F (36.6 C), temperature source Oral, resp. rate 16, height 5\' 4"  (1.626 m), weight 50.4 kg (111 lb 3.2 oz), SpO2 96 %.  PHYSICAL EXAMINATION:  GENERAL:  82 y.o.-year-old patient lying in the bed with no acute distress.  EYES: Pupils equal, round, reactive to light and accommodation. No scleral icterus. HEENT: Head atraumatic, normocephalic. Oropharynx and nasopharynx clear.  NECK:  Supple, no jugular venous distention. No thyroid enlargement, no tenderness.  LUNGS: Normal breath sounds bilaterally, no wheezing, rales,rhonchi or crepitation. No use of accessory muscles of respiration.  CARDIOVASCULAR: S1, S2 normal. No murmurs, rubs, or gallops.  ABDOMEN: Soft, nontender, nondistended. Bowel sounds present. No organomegaly or mass.  EXTREMITIES: No pedal edema, cyanosis, or clubbing.  NEUROLOGIC: disoriented PSYCHIATRIC: The patient is delerious SKIN: No obvious rash, lesion, or ulcer.    LABORATORY PANEL:   CBC  Recent Labs Lab 06/26/16 0432  WBC 6.2  HGB 9.9*  HCT 31.4*   PLT 227   ------------------------------------------------------------------------------------------------------------------  Chemistries   Recent Labs Lab 06/25/16 0459 06/26/16 0432  NA 141  --   K 3.9  --   CL 116*  --   CO2 19*  --   GLUCOSE 154*  --   BUN 38*  --   CREATININE 2.07* 1.81*  CALCIUM 7.9*  --   MG 1.9  --    ------------------------------------------------------------------------------------------------------------------  Cardiac Enzymes  Recent Labs Lab 06/21/16 1832  TROPONINI 0.25*   ------------------------------------------------------------------------------------------------------------------  RADIOLOGY:  No results found.  EKG:   Orders placed or performed during the hospital encounter of 06/21/16  . EKG 12-Lead  . EKG 12-Lead    ASSESSMENT AND PLAN:  HISTORY OF PRESENT ILLNESS: Julie Burch  is a 82 y.o. female with a known history of Hypertension, recurrent urinary tract infection, CVA, depression, diabetes mellitus presented to the emergency room after she had a fall in the nursing home. Patient was trying to get out of the bed she lost balance and fell down. In the emergency room she is lethargic with decreased responsiveness not much history could be obtained from the patient. She has a contusion over the left side of the fore head secondary to fall. Workup in the emergency room showed elevated troponin and low potassium level. Oral and IV potassium supplementation was started. Not much history could be obtained from the patient. Review h n p for details   #Failure to thrive Patient's with multiple medical problems and aspirating though she is on dysphagia diet clinically deteriorating. Palliative care consult  placed DAUGHTER. Julie Burch is agreeable with the comfort care. other family also agreed with comfort care measures  #Acute CHF IV Lasix given  Monitored  intake and output and daily weights Followed -up with cardiology  # RUE  weakness and dysarthria Stroke w/u ct head neg, MRI negative  Recent carotid dopplers 12/13 ICA stenosis <50 % ECHO 12/4 - 60 -65 % EF Passed bed side swallow eval, dysphagia diet per ST but aspiration  Appreciate neuro recommendations  Rue doppler neg dvt Given  Eliquis, now comfort care  #UTI with YEAST IV Rocephin discontinued as the cultures are positive with greater than 100,000 colonies of yeast Started on Diflucan as patient is clinically sick, d/ced as she is comfort care  # .severe hypokalemia secondary to GI losses Improved, likely secondary to GI losses:    #Clostridium difficult colitis Patient is started on by mouth vancomycin  #paroxysmal atrial fibrillation, new diagnosis.   Cardizem CD 120 mg by mouth once daily, metoprolol 50 mg by mouth twice a day,  Cardiology has recommended  eliquis 2.5 mg by mouth twice a day for anticoagulation Appreciate cardiology recommendations  Discontinue Plavix  #. Hypothyroidism ;continue Synthroid  #History of dementia, depression patient is on Seroquel, Zoloft,;,aricept, requested that we continue these medicines.  # GERD continue PPIs  # essential hypertension: Controlled  # diabetes mellitus -uncontrolled hemoglobin A1c 8.9 comfort care  # mild acute renal failure due to poor by mouth intake, diarrhea:  discontinue IV fluids Avoid nephrotoxins     # mildly elevated troponins  likely due to fall/demand ischemiafrom paroxysmal atrial fibrillation troponins not trending0.29-0.22-0.25 .cardio followed pt      All the records are reviewed and case discussed with Care Management/Social Workerr. Management plans discussed with the patient, family and they are in agreement.  CODE STATUS: dnr , comfort care  TOTAL TIME TAKING CARE OF THIS PATIENT: 32 minutes.   POSSIBLE D/C IN ?  DAYS, DEPENDING ON CLINICAL CONDITION.  Note: This dictation was prepared with Dragon dictation along with smaller phrase  technology. Any transcriptional errors that result from this process are unintentional.   Ramonita LabGouru, Sokhna Christoph M.D on 06/27/16 Between 7am to 6pm - Pager - [phone] After 6pm go to [email or web address] - password [email or web address] [facility]  [facility]  Office  [phone]  CC: Primary care physician; Mariam DollarPHELPS, ANNE F, MD

## 2016-06-28 NOTE — Progress Notes (Signed)
Daily Progress Note   Patient Name: Julie Burch       Date: 06/28/2016 DOB: [birth year]  Age: 82 y.o. MRN#: [id number] Attending Physician: Ramonita LabAruna Gouru, MD Primary Care Physician: Mariam DollarPHELPS, ANNE F, MD Admit Date: 06/21/2016  Reason for Consultation/Follow-up: Establishing goals of care  Subjective: [name] is lying in bed. She arouses to voice and touch. Difficult to understand. She does grimace and tense up when awake but drifted back to sleep. She did have an episode of nausea this morning but she did not receive any medication for this (+C diff). Moans with any movement. Feet are much cooler to touch but breathing still regular. Still not eating or drinking anything - refuses - providing moisture. Definitely progressing at EOL and likely days left.   I also spoke with daughter, Julie Burch, via telephone who is concerned about agitation since she is unable to have Seroquel. Julie Burch says her mother has become very agitated requiring restraints in the past when Seroquel was d/c. Will give scheduled dose of haldol qhs to prevent agitation and monitor carefully. Daughter, Julie Burch, at bedside told me yesterday that [name] was a little agitated and snapping at family when talking which is not at all her usual state.   Length of Stay: 7  Current Medications: Scheduled Meds:  . furosemide  20 mg Intravenous BID AC  . mouth rinse  15 mL Mouth Rinse BID  . sodium chloride flush  3 mL Intravenous Q12H    Continuous Infusions:   PRN Meds: sodium chloride, acetaminophen **OR** acetaminophen, antiseptic oral rinse, glycopyrrolate, haloperidol lactate, ipratropium-albuterol, metoprolol, morphine CONCENTRATE, ondansetron (ZOFRAN) IV, polyvinyl alcohol, sodium chloride flush  Physical Exam    Constitutional: She appears well-developed. She appears lethargic.  HENT:  Head: Normocephalic.  Cardiovascular: An irregularly irregular rhythm present. Tachycardia present.   Bilat feet cold to touch today.   Pulmonary/Chest: Effort normal. No accessory muscle usage. No tachypnea. No respiratory distress.  Abdominal: Normal appearance.  Neurological: She appears lethargic.  Attempting more to try and talk but falls back asleep  Nursing note and vitals reviewed.           Vital Signs: BP 136/77 (BP Location: Right Arm)   Pulse (!) 135   Temp 97.8 F (36.6 C) (Oral)   Resp 16   Ht 5\' 4"  (1.626  m)   Wt 50.4 kg (111 lb 3.2 oz)   SpO2 96%   BMI 19.09 kg/m  SpO2: SpO2: 96 % O2 Device: O2 Device: Nasal Cannula O2 Flow Rate: O2 Flow Rate (L/min): 2 L/min  Intake/output summary:   Intake/Output Summary (Last 24 hours) at 06/28/16 1215 Last data filed at 06/28/16 [id number]  Gross per 24 hour  Intake                3 ml  Output             1850 ml  Net            -1847 ml   LBM: Last BM Date: 06/27/16 Baseline Weight: Weight: 55.3 kg (122 lb) Most recent weight: Weight: 50.4 kg (111 lb 3.2 oz)       Palliative Assessment/Data: PPS: 10%     Patient Active Problem List   Diagnosis Date Noted  . Goals of care, counseling/discussion   . Terminal care   . Palliative care encounter   . Dysphagia   . Acute diastolic CHF (congestive heart failure) (HCC) 06/26/2016  . Bilateral pleural effusion 06/26/2016  . Acute delirium 06/23/2016  . New onset atrial fibrillation (HCC) 06/22/2016  . Altered mental state 06/22/2016  . C. difficile diarrhea 06/22/2016  . Hypokalemia 06/21/2016  . Elevated troponin 06/21/2016  . Pressure injury of skin 06/21/2016  . Septic shock (HCC) 06/08/2016  . Recurrent UTI 02/09/2016  . GERD (gastroesophageal reflux disease) 02/09/2016  . HTN (hypertension) 02/09/2016  . Hypothyroidism 02/09/2016  . Depression 02/09/2016  . Acute on chronic kidney  failure (HCC) 02/09/2016  . Diabetes (HCC) 02/09/2016    Palliative Care Assessment & Plan   HPI: 82 y.o. female  with past medical history of HTN, recurrent UTI, CVA, depression, diabetes mellitus, frequent falls admitted on 06/21/2016 with fall. Pt has continued to decline with poor intake and concern for aspiration. Palliative care consulted to assist with GOC conversations.  Assessment: Lying in bed. Lethargic. Provided moisture to mouth.   Recommendations/Plan:  PRN medications added for pain, dyspnea, agitation/anxiety, secretions, nausea.   Pain: Roxanol prn - please give before cleaning or any activity.   Comfort feeding: Likely frequent mouth care with small drops of water and/or ice chips. Her only desire is for moisture.   Agitation: Haldol 1 mg qhs. Haldol 1 mg every 6 hours prn.   Goals of Care and Additional Recommendations:  Limitations on Scope of Treatment: Full Comfort Care  Code Status:  DNR  Prognosis:   Hours - Days  Discharge Planning:  Hospice facility  Thank you for allowing the Palliative Medicine Team to assist in the care of this patient.   Time In: 0920 Time Out: 0945 Total Time 25min Prolonged Time Billed  no       Greater than 50%  of this time was spent counseling and coordinating care related to the above assessment and plan.  Yong ChannelAlicia Malesha Suliman, NP Palliative Medicine Team Pager # [phone] (M-F 8a-5p) Team Phone # [phone] (Nights/Weekends)

## 2016-07-10 DEATH — deceased

## 2017-04-23 IMAGING — CT CT CERVICAL SPINE W/O CM
3 of 5 series · 13 of 33 positions shown, 15 images · non-contrast
Comparison: 05/06/2016

CLINICAL DATA: Unwitnessed fall today, large hematoma with
laceration to the right frontal area, hx of dementia.

EXAM:
CT HEAD WITHOUT CONTRAST
CT CERVICAL SPINE WITHOUT CONTRAST
TECHNIQUE: Multidetector CT imaging of the head and cervical spine was
performed following the standard protocol without intravenous
contrast. Multiplanar CT image reconstructions of the cervical spine
were also generated.

[Series 5: c spine soft · axial · 0.32mm/px · z∈[-291,-189]mm · 5 of 67 slices shown, 7 images]
[im 8/67  soft-tissue]
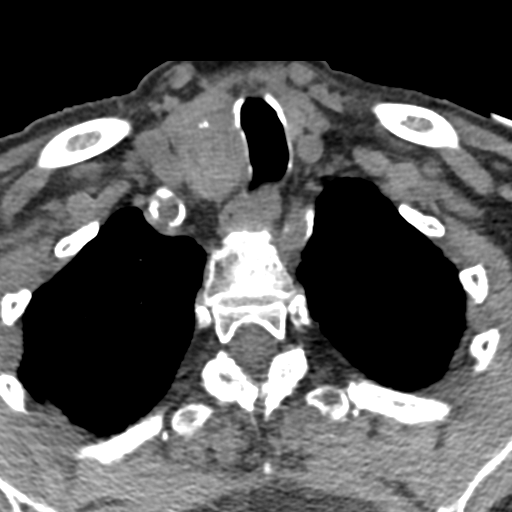
[im 8/67  bone]
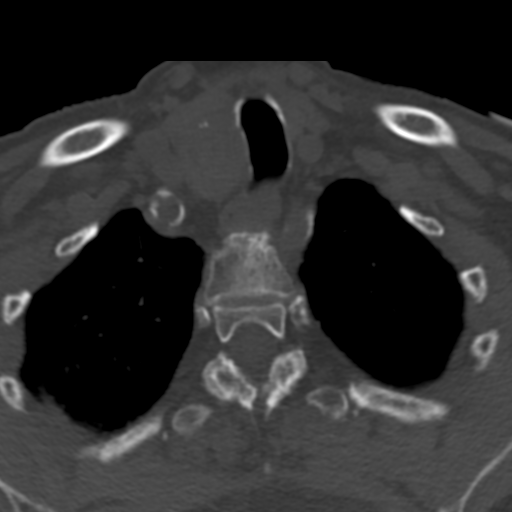
[im 23/67  bone]
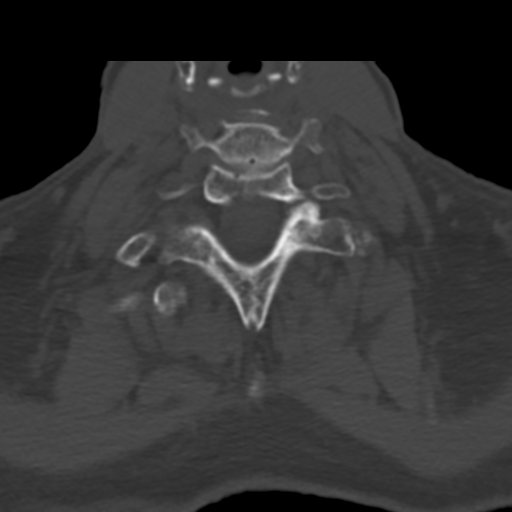
[im 37/67  bone]
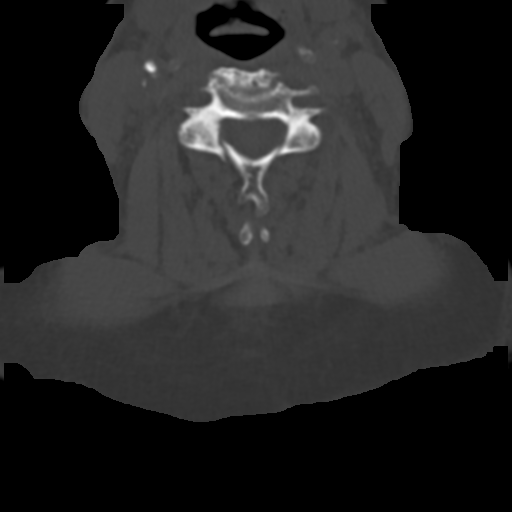
[im 45/67  bone]
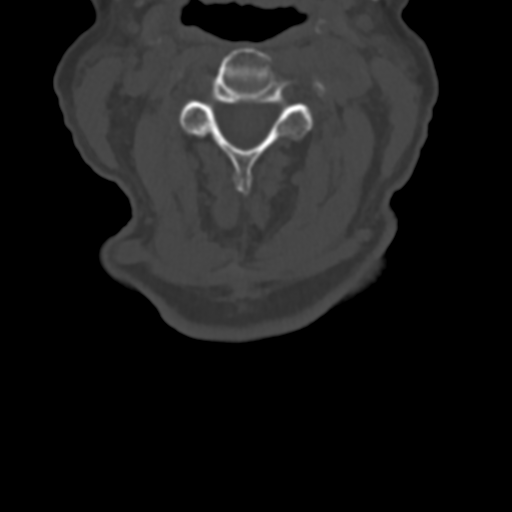
[im 59/67  soft-tissue]
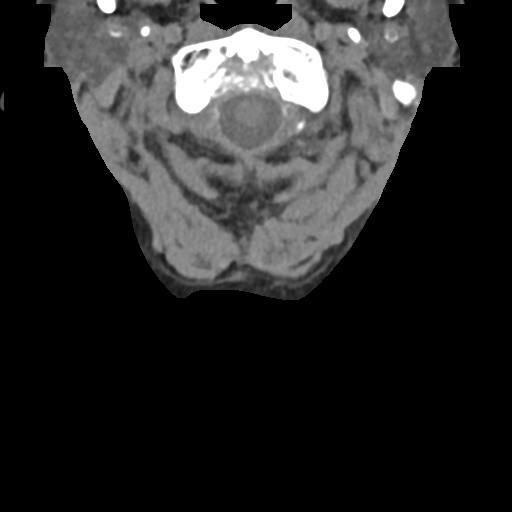
[im 59/67  bone]
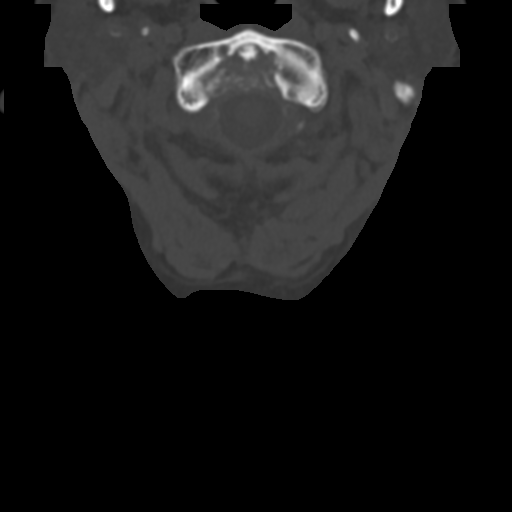

[Series 6: coronal soft tissue · coronal · 0.27mm/px · 3 of 62 slices shown]
[im 15/62  bone]
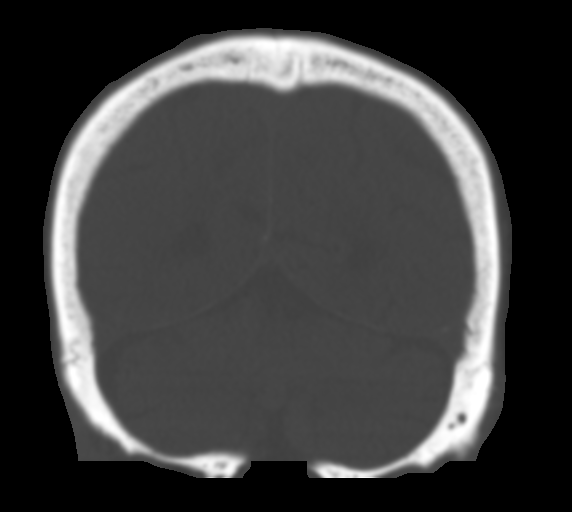
[im 26/62  bone]
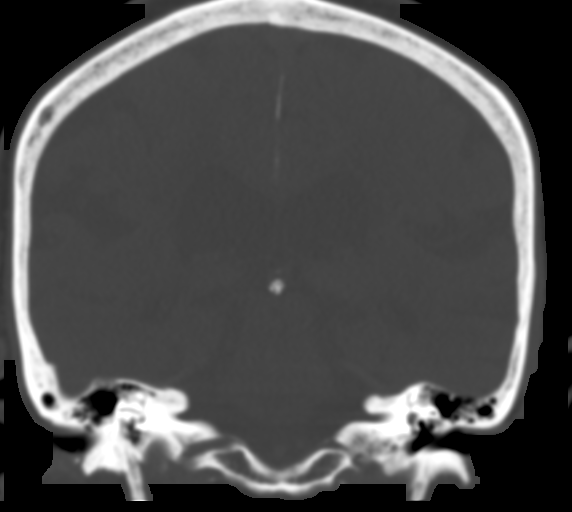
[im 36/62  bone]
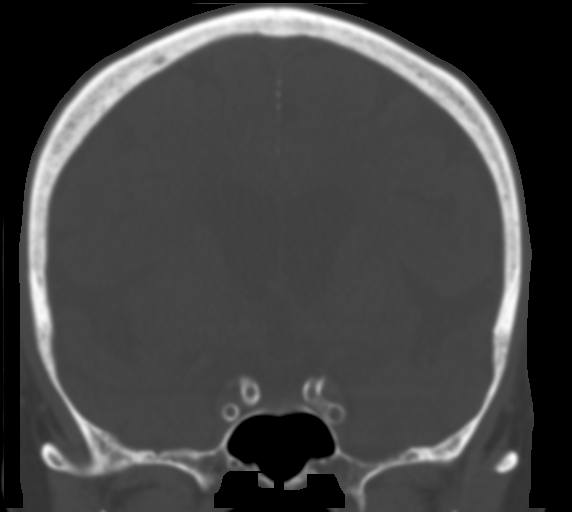

[Series 8: sagittal bone · sagittal · 0.20mm/px · 5 of 61 slices shown]
[im 11/61  bone]
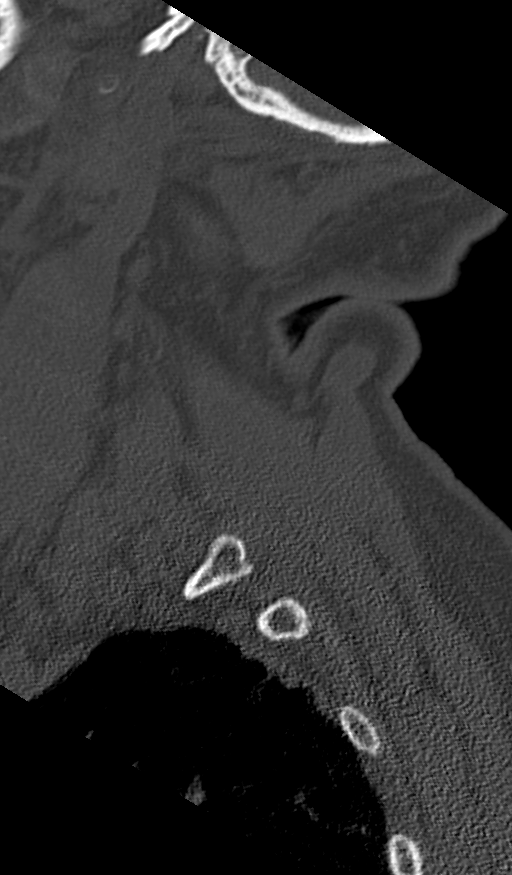
[im 21/61  bone]
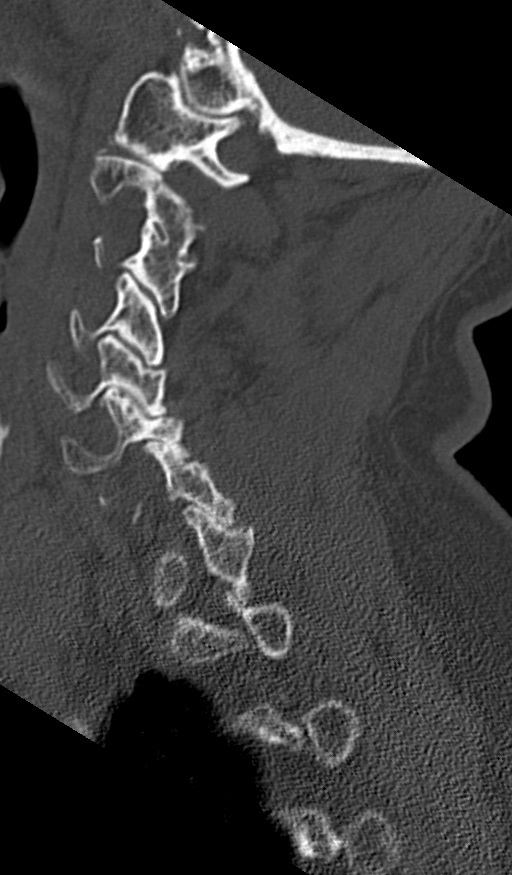
[im 31/61  bone]
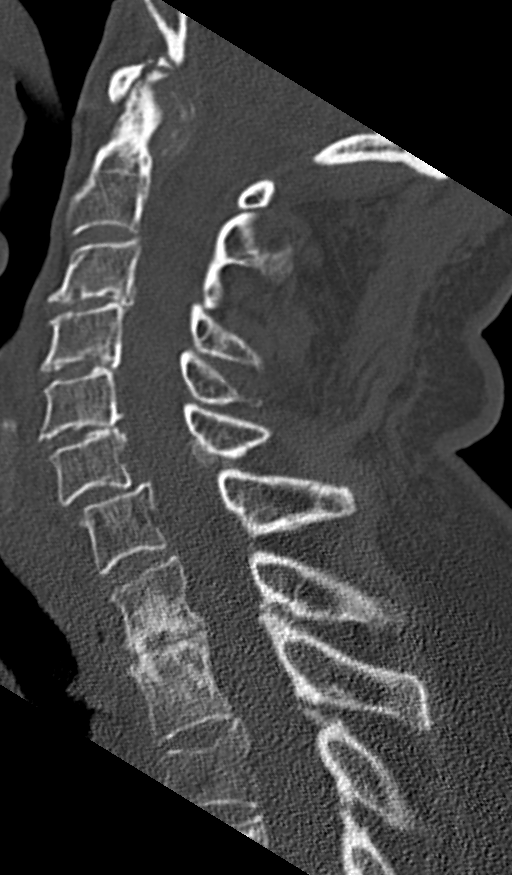
[im 41/61  bone]
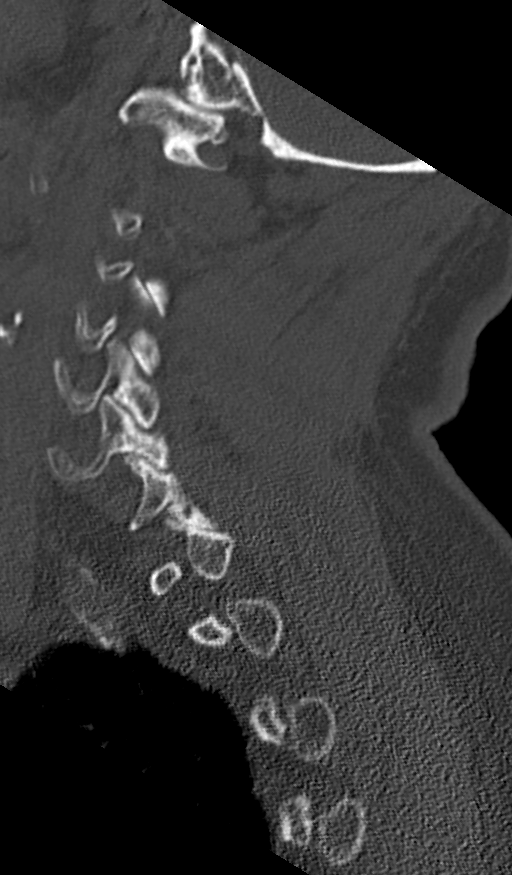
[im 51/61  bone]
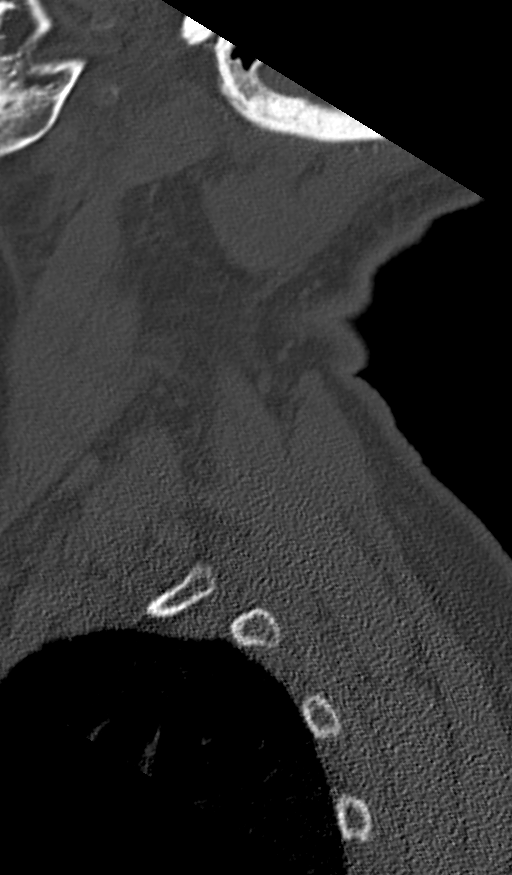

[13 of 33 positions shown; findings below may reference images not displayed]

FINDINGS: CT HEAD FINDINGS

Brain: There is central and cortical atrophy. Periventricular white
matter changes are consistent with small vessel disease. There is no
intra or extra-axial fluid collection or mass lesion. The basilar
cisterns and ventricles have a normal appearance. There is no CT
evidence for acute infarction or hemorrhage.

Vascular: There is atherosclerotic calcification of the carotid
siphons.

Skull: Right frontal scalp edema/hematoma not associated with
fracture. Calvarium is intact.

Sinuses/Orbits: No acute abnormality.

Other: None

CT CERVICAL SPINE FINDINGS

Alignment: There is 4 mm anterolisthesis of C6 on C7. Significant
disc height loss of T1 -T2. There is no acute fracture or traumatic
subluxation. Significant pannus formation at C1-2.

Skull base and vertebrae: No acute fracture. No primary bone lesion
or focal pathologic process.

Soft tissues and spinal canal: No prevertebral fluid or swelling. No
visible canal hematoma.

Disc levels: Significant disc height loss throughout the mid
cervical spine and upper thoracic spine.

Upper chest: Negative.

Other: There is a 2.8 x 2.0 cm right thyroid nodule which appears
solid and slightly heterogeneous.
IMPRESSION: 1.  No evidence for acute intracranial abnormality.
2. Large right frontal scalp hematoma/edema not associated with
fracture.
3. Stable anterolisthesis C6 on C7.
4.  No evidence for acute cervical spine abnormality.
5. Right thyroid nodule warrants further evaluation with thyroid
ultrasound.
